# Patient Record
Sex: Female | Born: 1935 | Race: Black or African American | Hispanic: No | Smoking: Former smoker
Health system: Southern US, Community
[De-identification: ages and names within clinical notes are randomized; demographics above are authoritative.]

## PROBLEM LIST (undated history)

## (undated) DIAGNOSIS — I4891 Unspecified atrial fibrillation: Secondary | ICD-10-CM

## (undated) DIAGNOSIS — H9190 Unspecified hearing loss, unspecified ear: Secondary | ICD-10-CM

## (undated) DIAGNOSIS — I10 Essential (primary) hypertension: Secondary | ICD-10-CM

## (undated) DIAGNOSIS — I509 Heart failure, unspecified: Secondary | ICD-10-CM

## (undated) DIAGNOSIS — I499 Cardiac arrhythmia, unspecified: Secondary | ICD-10-CM

## (undated) HISTORY — DX: Unspecified hearing loss, unspecified ear: H91.90

## (undated) HISTORY — DX: Heart failure, unspecified: I50.9

## (undated) HISTORY — DX: Cardiac arrhythmia, unspecified: I49.9

---

## 1998-03-19 ENCOUNTER — Ambulatory Visit (HOSPITAL_COMMUNITY): Admission: RE | Admit: 1998-03-19 | Discharge: 1998-03-19 | Payer: Self-pay | Admitting: Neurosurgery

## 1998-04-16 ENCOUNTER — Observation Stay (HOSPITAL_COMMUNITY): Admission: RE | Admit: 1998-04-16 | Discharge: 1998-04-17 | Payer: Self-pay | Admitting: Neurosurgery

## 2008-10-30 ENCOUNTER — Ambulatory Visit: Payer: Self-pay | Admitting: General Practice

## 2008-11-14 ENCOUNTER — Inpatient Hospital Stay: Payer: Self-pay | Admitting: General Practice

## 2009-05-24 ENCOUNTER — Ambulatory Visit: Payer: Self-pay | Admitting: General Practice

## 2009-05-28 ENCOUNTER — Ambulatory Visit: Payer: Self-pay | Admitting: Cardiovascular Disease

## 2009-06-13 ENCOUNTER — Inpatient Hospital Stay: Payer: Self-pay | Admitting: General Practice

## 2011-10-24 ENCOUNTER — Emergency Department: Payer: Self-pay | Admitting: Emergency Medicine

## 2011-11-02 ENCOUNTER — Emergency Department: Payer: Self-pay | Admitting: Internal Medicine

## 2016-02-09 ENCOUNTER — Ambulatory Visit (INDEPENDENT_AMBULATORY_CARE_PROVIDER_SITE_OTHER): Payer: Medicare HMO

## 2016-02-09 ENCOUNTER — Ambulatory Visit
Admission: EM | Admit: 2016-02-09 | Discharge: 2016-02-09 | Disposition: A | Discharge status: Home / Self Care | Payer: Medicare HMO | Attending: Family Medicine | Admitting: Family Medicine

## 2016-02-09 ENCOUNTER — Encounter: Payer: Self-pay | Admitting: Emergency Medicine

## 2016-02-09 DIAGNOSIS — J181 Lobar pneumonia, unspecified organism: Secondary | ICD-10-CM

## 2016-02-09 DIAGNOSIS — J189 Pneumonia, unspecified organism: Secondary | ICD-10-CM

## 2016-02-09 HISTORY — DX: Unspecified atrial fibrillation: I48.91

## 2016-02-09 HISTORY — DX: Essential (primary) hypertension: I10

## 2016-02-09 MED ORDER — LEVOFLOXACIN 500 MG PO TABS: 500.0000 mg | ORAL_TABLET | Freq: Every day | ORAL | 0 refills | Status: DC

## 2016-02-09 NOTE — Discharge Instructions (Signed)
Take antibiotic as prescribed.  Please see her primary this week.  Take care  Dr. Lacinda Axon

## 2016-02-09 NOTE — ED Provider Notes (Signed)
[facility]    CSN: [id number] Arrival date & time: 02/09/16  [id number]  History   Chief Complaint Chief Complaint  Patient presents with  . Cough   HPI  83 year old female with a history of atrial fibrillation, GERD, hypertension presents with complaints of cough. Care everywhere states a history of heart failure with preserved ejection fraction as well.  Patient reports that she's had cough for the past week. Cough is persistent and nagging. Moderate in severity. Productive of thick yellow sputum. She's been using over-the-counter Robitussin, cough drops, and Delsym without symptom improvement. No reports of increasing shortness of breath. She does appear to have some baseline shortness of breath per the electronic medical record secondary to nature fibrillation. No known exacerbating factors. No associated fever or chills. She has no other complaints or concerns at this time.  Past Medical History:  Diagnosis Date  . Atrial fibrillation ([place])   . Hypertension    History reviewed. No pertinent surgical history.  OB History    No data available     Home Medications    Prior to Admission medications   Medication Sig Start Date End Date Taking? Authorizing Provider  levofloxacin (LEVAQUIN) 500 MG tablet Take 1 tablet (500 mg total) by mouth daily. 02/09/16   Coral Spikes, DO   Family History Family hx of breast cancer.  Social History Social History  Substance Use Topics  . Smoking status: Never Smoker  . Smokeless tobacco: Never Used  . Alcohol use No   Allergies   Patient has no known allergies.  Review of Systems Review of Systems  Constitutional: Negative for fever.  Respiratory: Positive for cough.        Denies shortness of breath.  All other systems reviewed and are negative.  Physical Exam Triage Vital Signs ED Triage Vitals  Enc Vitals Group     BP 02/09/16 1033 (!) 161/91     Pulse Rate 02/09/16 1033 62     Resp 02/09/16 1033 17     Temp  02/09/16 1033 98 F (36.7 C)     Temp Source 02/09/16 1033 Oral     SpO2 02/09/16 1033 95 %     Weight 02/09/16 1031 170 lb (77.1 kg)     Height 02/09/16 1031 5\' 1"  (1.549 m)     Head Circumference --      Peak Flow --      Pain Score 02/09/16 1033 0     Pain Loc --      Pain Edu? --      Excl. in [place]? --    No data found.   Updated Vital Signs BP (!) 161/91 (BP Location: Right Arm)   Pulse 62   Temp 98 F (36.7 C) (Oral)   Resp 17   Ht 5\' 1"  (1.549 m)   Wt 170 lb (77.1 kg)   SpO2 95%   BMI 32.12 kg/m   Physical Exam  Constitutional: She is oriented to person, place, and time.  Elderly female in no acute distress.  HENT:  Head: Normocephalic and atraumatic.  Mouth/Throat: Oropharynx is clear and moist.  Left TM obscured by cerumen. Right TM normal.  Eyes: Conjunctivae are normal.  Neck: Normal range of motion.  Cardiovascular:  Regular rate and rhythm. Patient is not in atrial fibrillation at this time. 1+ lower extremity edema bilaterally.  Pulmonary/Chest: Effort normal.  Bibasilar crackles noted.  Abdominal: Soft. She exhibits no distension. There is no tenderness.  Neurological: She  is alert and oriented to person, place, and time.  Skin: No rash noted.  Psychiatric: She has a normal mood and affect.  Vitals reviewed.  UC Treatments / Results  Labs (all labs ordered are listed, but only abnormal results are displayed) Labs Reviewed - No data to display  EKG  EKG Interpretation None      Radiology Dg Chest 2 View  Result Date: 02/09/2016 CLINICAL DATA:  Cough for 1 week. EXAM: CHEST  2 VIEW COMPARISON:  None. FINDINGS: Mild opacity in the right mid lung on the frontal view. Mild atelectasis in the bases. The cardiomediastinal silhouette is normal. No pneumothorax. No nodules or masses. IMPRESSION: Probable mild pneumonia in the right mid lung. Recommend follow-up to resolution. Electronically Signed   By: Dorise Bullion III M.D   On: 02/09/2016 11:15    Procedures Procedures (including critical care time)  Medications Ordered in UC Medications - No data to display   Initial Impression / Assessment and Plan / UC Course  I have reviewed the triage vital signs and the nursing notes.  Pertinent labs & imaging results that were available during my care of the patient were reviewed by me and considered in my medical decision making (see chart for details).   83 year old female with atrial fibrillation, hypertension, heart failure with preserved ejection fraction presents with cough. Given age and comorbidities, chest x-ray was obtained.  1130 - chest x-ray revealed probable right middle lobe pneumonia. Treating with Levaquin. Advised PCP follow-up this week. Final Clinical Impressions(s) / UC Diagnoses   Final diagnoses:  Community acquired pneumonia of right middle lobe of lung ([place])    New Prescriptions Discharge Medication List as of 02/09/2016 11:32 AM    START taking these medications   Details  levofloxacin (LEVAQUIN) 500 MG tablet Take 1 tablet (500 mg total) by mouth daily., Starting Sat 02/09/2016, Normal         Coral Spikes, DO 02/09/16 1134

## 2016-02-09 NOTE — ED Triage Notes (Signed)
Patient c/o cough and chest congestion for 3-4 days.  Patient denies fevers.

## 2016-02-21 ENCOUNTER — Other Ambulatory Visit: Payer: Self-pay | Admitting: Family Medicine

## 2016-02-21 ENCOUNTER — Ambulatory Visit
Admission: RE | Admit: 2016-02-21 | Discharge: 2016-02-21 | Disposition: A | Discharge status: Home / Self Care | Payer: Medicare HMO | Source: Ambulatory Visit | Attending: Family Medicine | Admitting: Family Medicine

## 2016-02-21 DIAGNOSIS — R059 Cough, unspecified: Secondary | ICD-10-CM

## 2016-02-21 DIAGNOSIS — R05 Cough: Secondary | ICD-10-CM

## 2016-02-21 DIAGNOSIS — J189 Pneumonia, unspecified organism: Secondary | ICD-10-CM | POA: Insufficient documentation

## 2016-12-31 ENCOUNTER — Inpatient Hospital Stay
Admission: EM | Admit: 2016-12-31 | Discharge: 2017-01-08 | DRG: 871 | Disposition: A | Discharge status: Skilled Nursing Facility | Payer: Medicare HMO | Attending: Internal Medicine | Admitting: Internal Medicine

## 2016-12-31 ENCOUNTER — Other Ambulatory Visit: Payer: Self-pay

## 2016-12-31 ENCOUNTER — Emergency Department: Payer: Medicare HMO

## 2016-12-31 ENCOUNTER — Encounter: Payer: Self-pay | Admitting: Emergency Medicine

## 2016-12-31 DIAGNOSIS — F4320 Adjustment disorder, unspecified: Secondary | ICD-10-CM | POA: Diagnosis present

## 2016-12-31 DIAGNOSIS — I481 Persistent atrial fibrillation: Secondary | ICD-10-CM | POA: Diagnosis present

## 2016-12-31 DIAGNOSIS — E782 Mixed hyperlipidemia: Secondary | ICD-10-CM | POA: Diagnosis present

## 2016-12-31 DIAGNOSIS — Z515 Encounter for palliative care: Secondary | ICD-10-CM

## 2016-12-31 DIAGNOSIS — Z7984 Long term (current) use of oral hypoglycemic drugs: Secondary | ICD-10-CM

## 2016-12-31 DIAGNOSIS — Z79899 Other long term (current) drug therapy: Secondary | ICD-10-CM

## 2016-12-31 DIAGNOSIS — I509 Heart failure, unspecified: Secondary | ICD-10-CM | POA: Diagnosis not present

## 2016-12-31 DIAGNOSIS — I130 Hypertensive heart and chronic kidney disease with heart failure and stage 1 through stage 4 chronic kidney disease, or unspecified chronic kidney disease: Secondary | ICD-10-CM | POA: Diagnosis present

## 2016-12-31 DIAGNOSIS — J9621 Acute and chronic respiratory failure with hypoxia: Secondary | ICD-10-CM | POA: Diagnosis present

## 2016-12-31 DIAGNOSIS — I482 Chronic atrial fibrillation: Secondary | ICD-10-CM | POA: Diagnosis present

## 2016-12-31 DIAGNOSIS — I5033 Acute on chronic diastolic (congestive) heart failure: Secondary | ICD-10-CM | POA: Diagnosis present

## 2016-12-31 DIAGNOSIS — J9601 Acute respiratory failure with hypoxia: Secondary | ICD-10-CM | POA: Diagnosis not present

## 2016-12-31 DIAGNOSIS — R0902 Hypoxemia: Secondary | ICD-10-CM

## 2016-12-31 DIAGNOSIS — K219 Gastro-esophageal reflux disease without esophagitis: Secondary | ICD-10-CM | POA: Diagnosis present

## 2016-12-31 DIAGNOSIS — I4891 Unspecified atrial fibrillation: Secondary | ICD-10-CM

## 2016-12-31 DIAGNOSIS — F329 Major depressive disorder, single episode, unspecified: Secondary | ICD-10-CM | POA: Diagnosis present

## 2016-12-31 DIAGNOSIS — Z66 Do not resuscitate: Secondary | ICD-10-CM | POA: Diagnosis present

## 2016-12-31 DIAGNOSIS — I248 Other forms of acute ischemic heart disease: Secondary | ICD-10-CM | POA: Diagnosis present

## 2016-12-31 DIAGNOSIS — A419 Sepsis, unspecified organism: Principal | ICD-10-CM | POA: Diagnosis present

## 2016-12-31 DIAGNOSIS — N179 Acute kidney failure, unspecified: Secondary | ICD-10-CM | POA: Diagnosis present

## 2016-12-31 DIAGNOSIS — B963 Hemophilus influenzae [H. influenzae] as the cause of diseases classified elsewhere: Secondary | ICD-10-CM | POA: Diagnosis present

## 2016-12-31 DIAGNOSIS — Z9119 Patient's noncompliance with other medical treatment and regimen: Secondary | ICD-10-CM

## 2016-12-31 DIAGNOSIS — J9690 Respiratory failure, unspecified, unspecified whether with hypoxia or hypercapnia: Secondary | ICD-10-CM

## 2016-12-31 DIAGNOSIS — N183 Chronic kidney disease, stage 3 (moderate): Secondary | ICD-10-CM | POA: Diagnosis present

## 2016-12-31 DIAGNOSIS — J9600 Acute respiratory failure, unspecified whether with hypoxia or hypercapnia: Secondary | ICD-10-CM

## 2016-12-31 LAB — CBC
HEMATOCRIT: 35.5 % (ref 35.0–47.0)
HEMOGLOBIN: 11.2 g/dL — AB (ref 12.0–16.0)
MCH: 26.3 pg (ref 26.0–34.0)
MCHC: 31.6 g/dL — ABNORMAL LOW (ref 32.0–36.0)
MCV: 83.2 fL (ref 80.0–100.0)
Platelets: 240 10*3/uL (ref 150–440)
RBC: 4.26 MIL/uL (ref 3.80–5.20)
RDW: 17 % — AB (ref 11.5–14.5)
WBC: 14.2 10*3/uL — AB (ref 3.6–11.0)

## 2016-12-31 LAB — COMPREHENSIVE METABOLIC PANEL
ALBUMIN: 2.7 g/dL — AB (ref 3.5–5.0)
ALK PHOS: 84 U/L (ref 38–126)
ALT: 21 U/L (ref 14–54)
AST: 49 U/L — AB (ref 15–41)
Anion gap: 10 (ref 5–15)
BILIRUBIN TOTAL: 1.8 mg/dL — AB (ref 0.3–1.2)
BUN: 52 mg/dL — AB (ref 6–20)
CO2: 24 mmol/L (ref 22–32)
CREATININE: 1.66 mg/dL — AB (ref 0.44–1.00)
Calcium: 8.6 mg/dL — ABNORMAL LOW (ref 8.9–10.3)
Chloride: 103 mmol/L (ref 101–111)
GFR calc Af Amer: 31 mL/min — ABNORMAL LOW (ref >60)
GFR, EST NON AFRICAN AMERICAN: 27 mL/min — AB (ref >60)
GLUCOSE: 89 mg/dL (ref 65–99)
Potassium: 3.7 mmol/L (ref 3.5–5.1)
Sodium: 137 mmol/L (ref 135–145)
TOTAL PROTEIN: 7.2 g/dL (ref 6.5–8.1)

## 2016-12-31 LAB — BRAIN NATRIURETIC PEPTIDE: B NATRIURETIC PEPTIDE 5: 1058 pg/mL — AB (ref 0.0–100.0)

## 2016-12-31 LAB — TROPONIN I
TROPONIN I: 0.08 ng/mL — AB (ref <0.03)
TROPONIN I: 0.07 ng/mL — AB (ref <0.03)
TROPONIN I: 0.05 ng/mL — AB (ref <0.03)

## 2016-12-31 MED ORDER — FUROSEMIDE 10 MG/ML IJ SOLN
60.0000 mg | Freq: Once | INTRAMUSCULAR | Status: AC
  Administered 2016-12-31: 60 mg via INTRAVENOUS
  Filled 2016-12-31: qty 8

## 2016-12-31 MED ORDER — ACETAMINOPHEN 650 MG RE SUPP: 650.0000 mg | Freq: Four times a day (QID) | RECTAL | Status: DC | PRN

## 2016-12-31 MED ORDER — RIVAROXABAN 20 MG PO TABS
20.0000 mg | ORAL_TABLET | Freq: Every day | ORAL | Status: DC
  Administered 2016-12-31: 20 mg via ORAL
  Filled 2016-12-31: qty 1

## 2016-12-31 MED ORDER — SODIUM CHLORIDE 0.9% FLUSH
3.0000 mL | Freq: Two times a day (BID) | INTRAVENOUS | Status: DC
  Administered 2016-12-31 – 2017-01-07 (×10): 3 mL via INTRAVENOUS

## 2016-12-31 MED ORDER — ONDANSETRON HCL 4 MG/2ML IJ SOLN: 4.0000 mg | Freq: Four times a day (QID) | INTRAMUSCULAR | Status: DC | PRN

## 2016-12-31 MED ORDER — FUROSEMIDE 10 MG/ML IJ SOLN
20.0000 mg | Freq: Two times a day (BID) | INTRAMUSCULAR | Status: DC
  Administered 2016-12-31 – 2017-01-01 (×3): 20 mg via INTRAVENOUS
  Filled 2016-12-31 (×4): qty 2

## 2016-12-31 MED ORDER — SENNOSIDES-DOCUSATE SODIUM 8.6-50 MG PO TABS: 1.0000 | ORAL_TABLET | Freq: Every evening | ORAL | Status: DC | PRN

## 2016-12-31 MED ORDER — SODIUM CHLORIDE 0.9% FLUSH
3.0000 mL | INTRAVENOUS | Status: DC | PRN
  Administered 2016-12-31: 3 mL via INTRAVENOUS
  Filled 2016-12-31: qty 3

## 2016-12-31 MED ORDER — ACETAMINOPHEN 325 MG PO TABS: 650.0000 mg | ORAL_TABLET | Freq: Four times a day (QID) | ORAL | Status: DC | PRN

## 2016-12-31 MED ORDER — ONDANSETRON HCL 4 MG PO TABS: 4.0000 mg | ORAL_TABLET | Freq: Four times a day (QID) | ORAL | Status: DC | PRN

## 2016-12-31 MED ORDER — RIVAROXABAN 20 MG PO TABS: 20.0000 mg | ORAL_TABLET | Freq: Every day | ORAL | Status: DC

## 2016-12-31 MED ORDER — SODIUM CHLORIDE 0.9 % IV SOLN: 250.0000 mL | INTRAVENOUS | Status: DC | PRN

## 2016-12-31 MED ORDER — KETOROLAC TROMETHAMINE 15 MG/ML IJ SOLN: 15.0000 mg | Freq: Four times a day (QID) | INTRAMUSCULAR | Status: DC | PRN

## 2016-12-31 MED ORDER — METOPROLOL SUCCINATE ER 100 MG PO TB24
100.0000 mg | ORAL_TABLET | Freq: Every day | ORAL | Status: DC
Start: 2016-12-31 — End: 2017-01-08
  Administered 2016-12-31 – 2017-01-08 (×7): 100 mg via ORAL
  Filled 2016-12-31 (×7): qty 1

## 2016-12-31 MED ORDER — HYDROCODONE-ACETAMINOPHEN 5-325 MG PO TABS: 1.0000 | ORAL_TABLET | ORAL | Status: DC | PRN

## 2016-12-31 NOTE — Progress Notes (Signed)
Family Meeting Note  Advance Directive:no  Today a meeting took place with the Patient. The following clinical team members were present during this meeting:MD  The following were discussed:Patient's diagnosis: chf exacerbation Atrial fibrillation, Patient's progosis: > 12 months and Goals for treatment: DNR  Additional follow-up to be provided: Chaplain consult to start advanced directives Time spent during discussion: 16 minutes  Mercedes Belko, MD

## 2016-12-31 NOTE — Progress Notes (Signed)
CCMD notified this RN that pt HR jumps to 170-180, MD notifed for possible PRN orders, pt also given 100mg  of scheduled metoprolol  and 20 mg scheduled  IV lasix at this time. Pt 92% on 3 L [place] acute from ED at this time, no furhter complaints.

## 2016-12-31 NOTE — ED Provider Notes (Signed)
[facility] Emergency Department Provider Note  Time seen: 9:17 AM  I have reviewed the triage vital signs and the nursing notes.   HISTORY  Chief Complaint Cough    HPI Mercedes Rodriguez is a 83 y.o. female with a past medical history of atrial fibrillation, hypertension, presents to the emergency department for shortness of breath cough and congestion.  According to the patient for the past 3 days she has been coughing with yellow/green sputum production.  Patient states increasing short of breath worse today.  Upon arrival the patient has not O2 saturation of 82% on room air, no home O2 requirement at baseline.  Patient denies any chest pain abdominal pain, vomiting, largely negative review of systems.   Past Medical History:  Diagnosis Date  . Atrial fibrillation ([place])   . Hypertension     There are no active problems to display for this patient.   No past surgical history on file.  Prior to Admission medications   Medication Sig Start Date End Date Taking? Authorizing Provider  levofloxacin (LEVAQUIN) 500 MG tablet Take 1 tablet (500 mg total) by mouth daily. 02/09/16   Coral Spikes, DO    No Known Allergies  No family history on file.  Social History Social History   Tobacco Use  . Smoking status: Never Smoker  . Smokeless tobacco: Never Used  Substance Use Topics  . Alcohol use: No  . Drug use: Not on file    Review of Systems Constitutional: Negative for fever ENT: Negative for congestion Cardiovascular: Negative for chest pain. Respiratory: Positive for shortness of breath, cough, sputum production. Gastrointestinal: Negative for abdominal pain, vomiting  Genitourinary: Negative for dysuria. Musculoskeletal: Negative for leg pain or swelling Skin: Negative for rash. Neurological: Negative for headache All other ROS negative  ____________________________________________   PHYSICAL EXAM:  VITAL SIGNS: ED Triage Vitals  Enc  Vitals Group     BP 12/31/16 0915 (!) 168/88     Pulse Rate 12/31/16 0915 94     Resp 12/31/16 0915 (!) 28     Temp 12/31/16 0915 98.7 F (37.1 C)     Temp Source 12/31/16 0915 Oral     SpO2 12/31/16 0915 (!) 82 %     Weight 12/31/16 0916 152 lb (68.9 kg)     Height --      Head Circumference --      Peak Flow --      Pain Score --      Pain Loc --      Pain Edu? --      Excl. in [place]? --     Constitutional: Alert and oriented.  Sitting upright in bed, mild tachypnea.  No distress though. Eyes: Normal exam ENT   Head: Normocephalic and atraumatic.   Mouth/Throat: Mucous membranes are moist. Cardiovascular: Normal rate, regular rhythm.  Respiratory: Mild tachypnea, moderate rhonchi in left lung fields, mostly clear right lung fields. Gastrointestinal: Soft and nontender. No distention.   Musculoskeletal: Nontender with normal range of motion in all extremities. No lower extremity tenderness or edema. Neurologic:  Normal speech and language. No gross focal neurologic deficits Skin:  Skin is warm, dry and intact.  Psychiatric: Mood and affect are normal.   ____________________________________________    EKG  EKG reviewed and interpreted by myself shows what appears to be most consistent with atrial fibrillation at 110 bpm with a narrow QRS, left axis deviation, largely normal intervals with nonspecific ST changes but no concerning  ST elevation noted.  ____________________________________________    RADIOLOGY  IMPRESSION: Cardiomegaly with diffuse bilateral interstitial prominence of small bilateral pleural effusions consistent with CHF .  ____________________________________________   INITIAL IMPRESSION / ASSESSMENT AND PLAN / ED COURSE  Pertinent labs & imaging results that were available during my care of the patient were reviewed by me and considered in my medical decision making (see chart for details).  Patient presents to the emergency department for  cough and shortness of breath.  Differential would include pneumonia, upper respiratory infection, pneumothorax, ACS.  We will check labs, chest x-ray and closely monitor in the emergency department.  The patient is currently satting 82% with a good waveform on room air, will place on oxygen via nasal cannula.  We will check labs including blood cultures.  Anticipate likely admission to the hospital given hypoxia.  I reviewed the patient's records including recent cardiology record obtaining this past medical history: Past Medical History:  Diagnosis Date  . Atrial fibrillation, persistent (CMS-HCC)  . Bilateral lower extremity edema  . Dyspnea on exertion  . Fatigue  . GERD (gastroesophageal reflux disease)  . Heart failure with preserved ejection fraction (CMS-HCC)  . Hypertension  . Hypoxia   No past surgical history on file.   Social History: Reviewed and updated in Epic.  History  Alcohol Use No   Allergies: Allergies  Allergen Reactions  . Aspirin Other (See Comments)  Non enteric coated aspirin causes stomach pain   Medications  Current Outpatient Prescriptions on File Prior to Visit  Medication Sig  . aspirin-acetaminophen-caffeine (EXCEDRIN MIGRAINE) 250-250-65 mg per tablet Take 2 tablets by mouth every six (6) hours as needed for pain.  Marland Kitchen dofetilide (TIKOSYN) 250 MCG capsule Take 1 capsule (250 mcg total) by mouth every twelve (12) hours.  . furosemide (LASIX) 20 MG tablet TAKE 1 TABLET (20 MG TOTAL) BY MOUTH DAILY AS NEEDED (FOR SHORTNESS OF BREATH).  . magnesium oxide (MAG-OX) 400 mg tablet TAKE 1 TABLET (400 MG TOTAL) BY MOUTH DAILY.  . metoprolol succinate (TOPROL-XL) 100 MG 24 hr tablet TAKE 1 TABLET BY MOUTH EVERY DAY  . omeprazole (PRILOSEC) 20 MG capsule Take 20 mg by mouth daily as needed.  . rivaroxaban (XARELTO) 20 mg tablet Take 1 tablet (20 mg total) by mouth daily.  . magnesium oxide (MAG-OX) 400 mg tablet Take 1 tablet (400 mg total) by mouth daily.  Marland Kitchen  spironolactone (ALDACTONE) 25 MG tablet Take 1 tablet (25 mg total) by mouth daily. (Patient not taking: Reported on 10/03/2016)      Chest x-ray appears most consistent with CHF exacerbation/pulmonary edema.   Patient's labs do show an elevated troponin 0.08, will add on a BNP.  At this time patient's workup is most consistent pulmonary edema/CHF exacerbation.  We will place on IV Lasix and admit to the hospital for further treatment.  Patient agreeable to plan.  ____________________________________________   FINAL CLINICAL IMPRESSION(S) / ED DIAGNOSES  CHF exacerbation     Harvest Dark, MD 12/31/16 1017

## 2016-12-31 NOTE — Progress Notes (Signed)
Chaplain received an OR for pt. who had requested for Advanced Directive. CH met pt and pt.'s aid in the ED but before chaplain educated pt., the pt. was moved to [id number]. CH followed the pt., waited for RN to complete pt.'s adjustment to the floor and then visited with pt.CH educated pt. on how to complete AD, pt. states she will review material and will call CH when ready to complete AD. CH is available to follow up pt. as needed.    12/31/16 1200  Clinical Encounter Type  Visited With Patient;Patient and family together  Visit Type Initial;Follow-up;Other (Comment)  Referral From Nurse  Consult/Referral To Chaplain  Spiritual Encounters  Spiritual Needs Literature;Brochure   

## 2016-12-31 NOTE — ED Notes (Signed)
Attempted to call report, receiving RN unable to take report at this time. Receiving RN to call back.

## 2016-12-31 NOTE — ED Triage Notes (Signed)
Pt arrived via EMS from home for reports of cough for two days. EMS reports initial foom air SpO2 84%, O2 4L via  brought SpO2 up to 95%. EMS reports VSS, a-fib on monitor, history of same, 99.7 oral temp.

## 2016-12-31 NOTE — H&P (Signed)
[place] [facility] [place] NAME: Mercedes Rodriguez    MR#:  [id number]  DATE OF BIRTH:  1929-06-12  DATE OF ADMISSION:  12/31/2016  PRIMARY CARE PHYSICIAN: Lynnell Jude, MD   REQUESTING/REFERRING PHYSICIAN: dr Kerman Passey  CHIEF COMPLAINT:   SOB HISTORY OF PRESENT ILLNESS:  Mercedes Rodriguez  is a 83 y.o. female with a known history of persistent atrial fibrillation, diastolic heart failure with pager ejection fraction and essential hypertension who presents with above complaint. Over the past 2-3 days patient has been complaining of increasing shortness of breath, dyspnea on exertion and PND. She is also reporting a cough however no fevers. She denies lower extremity edema or chest pain. Her family friend called EMS today due to patient complaining of shortness of breath. She was found to have oxygen saturations of 82% on room air. She does not wear oxygen at home. She gets her cardiology follow-up at [facility].   in the emergency room she has received IV Lasix. Chest x-ray is consistent with CHF exacerbation.  PAST MEDICAL HISTORY:   Past Medical History:  Diagnosis Date  . Atrial fibrillation ([place])   . Hypertension     PAST SURGICAL HISTORY:   none SOCIAL HISTORY:   Social History   Tobacco Use  . Smoking status: Never Smoker  . Smokeless tobacco: Never Used  Substance Use Topics  . Alcohol use: No    FAMILY HISTORY:  Breast cancer  DRUG ALLERGIES:  No Known Allergies  REVIEW OF SYSTEMS:   Review of Systems  Constitutional: Negative.  Negative for chills, fever and malaise/fatigue.  HENT: Negative.  Negative for ear discharge, ear pain, hearing loss, nosebleeds and sore throat.   Eyes: Negative.  Negative for blurred vision and pain.  Respiratory: Positive for cough and shortness of breath. Negative for hemoptysis and wheezing.   Cardiovascular: Positive for PND. Negative for chest pain, palpitations and leg swelling.  Gastrointestinal:  Negative.  Negative for abdominal pain, blood in stool, diarrhea, nausea and vomiting.  Genitourinary: Negative.  Negative for dysuria.  Musculoskeletal: Negative.  Negative for back pain.  Skin: Negative.   Neurological: Negative for dizziness, tremors, speech change, focal weakness, seizures and headaches.  Endo/Heme/Allergies: Negative.  Does not bruise/bleed easily.  Psychiatric/Behavioral: Negative.  Negative for depression, hallucinations and suicidal ideas.    MEDICATIONS AT HOME:   Prior to Admission medications   Medication Sig Start Date End Date Taking? Authorizing Provider  metoprolol succinate (TOPROL-XL) 100 MG 24 hr tablet Take 100 mg by mouth daily. 11/19/16  Yes [provider]  rivaroxaban (XARELTO) 20 MG TABS tablet Take 20 mg by mouth daily. 10/27/16  Yes [provider]  levofloxacin (LEVAQUIN) 500 MG tablet Take 1 tablet (500 mg total) by mouth daily. Patient not taking: Reported on 12/31/2016 02/09/16   Coral Spikes, DO      VITAL SIGNS:  Blood pressure 122/73, pulse 84, temperature 98.7 F (37.1 C), temperature source Oral, resp. rate (!) 24, weight 68.9 kg (152 lb), SpO2 91 %.  PHYSICAL EXAMINATION:   Physical Exam  Constitutional: She is oriented to person, place, and time and well-developed, well-nourished, and in no distress. No distress.  HENT:  Head: Normocephalic.  Eyes: No scleral icterus.  Neck: Normal range of motion. Neck supple. JVD present. No tracheal deviation present.  Cardiovascular: Normal rate, regular rhythm and normal heart sounds. Exam reveals no gallop and no friction rub.  No murmur heard. Pulmonary/Chest: Effort normal. No  respiratory distress. She has no wheezes. She has no rales. She exhibits no tenderness.  Crackles at bases  Abdominal: Soft. Bowel sounds are normal. She exhibits no distension and no mass. There is no tenderness. There is no rebound and no guarding.  Musculoskeletal: Normal range of motion. She  exhibits no edema.  Neurological: She is alert and oriented to person, place, and time.  Skin: Skin is warm. No rash noted. No erythema.  Psychiatric: Affect and judgment normal.      LABORATORY PANEL:   CBC Recent Labs  Lab 12/31/16 0915  WBC 14.2*  HGB 11.2*  HCT 35.5  PLT 240   ------------------------------------------------------------------------------------------------------------------  Chemistries  Recent Labs  Lab 12/31/16 0915  NA 137  K 3.7  CL 103  CO2 24  GLUCOSE 89  BUN 52*  CREATININE 1.66*  CALCIUM 8.6*  AST 49*  ALT 21  ALKPHOS 84  BILITOT 1.8*   ------------------------------------------------------------------------------------------------------------------  Cardiac Enzymes Recent Labs  Lab 12/31/16 0915  TROPONINI 0.08*   ------------------------------------------------------------------------------------------------------------------  RADIOLOGY:  Dg Chest Portable 1 View  Result Date: 12/31/2016 CLINICAL DATA:  Cough for 2 days. EXAM: PORTABLE CHEST 1 VIEW COMPARISON:  02/21/2016 . FINDINGS: Cardiomegaly with diffuse bilateral interstitial prominence and small bilateral pleural effusions consistent with CHF. No pneumothorax. Cervical spine fusion. Degenerative changes scoliosis thoracic spine . IMPRESSION: Cardiomegaly with diffuse bilateral interstitial prominence of small bilateral pleural effusions consistent with CHF . Electronically Signed   By: Marcello Moores  Register   On: 12/31/2016 09:29    EKG:   Ectopic atrial tachycardia PVCs HR 110  IMPRESSION AND PLAN:   83 year old female with a history of persistent atrial fibrillation, chronic diastolic heart failure with preserved ejection fraction who presents with shortness of breath.    1. Acute hypoxic respiratory failure in the setting of acute CHF exacerbation.   wean oxygen as tolerated  2. Acute on chronic diastolic heart failure with preserved ejection fraction: Lasix 20 IV  every 12 with plans to transition to oral once she is euvolemic. Continue to monitor intake and output with daily weight Continue metoprolol Patient is followed at [facility] cardiology. She will need CHF clinic referral upon discharge.  3. Persistent atrial fibrillation: Continue Xarelto and metoprolol.  4. Elevated troponin: This may be due to demand ischemia. Continue to trend troponins Continue telemetry If troponins are increasing then will have cardiology consultation.    All the records are reviewed and case discussed with ED provider. Management plans discussed with the patient and she is in agreement  CODE STATUS: DNR  TOTAL TIME TAKING CARE OF THIS PATIENT: 45 minutes.    Sulay Brymer M.D on 12/31/2016 at 10:36 AM  Between 7am to 6pm - Pager - [phone]  After 6pm go to [email or web address] - password [email or web address] [facility]  Office  [phone]  CC: Primary care physician; Lynnell Jude, MD

## 2016-12-31 NOTE — ED Notes (Signed)
Date and time results received: 12/31/16 1010(use smartphrase ".now" to insert current time)  Test: troponin Critical Value: 0.08  Name of Provider Notified: Dr. Kerman Passey  Orders Received? Or Actions Taken?: Orders Received - See Orders for details

## 2017-01-01 ENCOUNTER — Inpatient Hospital Stay: Payer: Medicare HMO

## 2017-01-01 DIAGNOSIS — J9601 Acute respiratory failure with hypoxia: Secondary | ICD-10-CM

## 2017-01-01 LAB — BASIC METABOLIC PANEL
Anion gap: 7 (ref 5–15)
BUN: 56 mg/dL — ABNORMAL HIGH (ref 6–20)
CALCIUM: 8.3 mg/dL — AB (ref 8.9–10.3)
CO2: 27 mmol/L (ref 22–32)
CREATININE: 1.7 mg/dL — AB (ref 0.44–1.00)
Chloride: 104 mmol/L (ref 101–111)
GFR, EST AFRICAN AMERICAN: 30 mL/min — AB (ref >60)
GFR, EST NON AFRICAN AMERICAN: 26 mL/min — AB (ref >60)
Glucose, Bld: 122 mg/dL — ABNORMAL HIGH (ref 65–99)
Potassium: 3.8 mmol/L (ref 3.5–5.1)
SODIUM: 138 mmol/L (ref 135–145)

## 2017-01-01 LAB — BLOOD CULTURE ID PANEL (REFLEXED)
Acinetobacter baumannii: NOT DETECTED
CANDIDA KRUSEI: NOT DETECTED
Candida albicans: NOT DETECTED
Candida glabrata: NOT DETECTED
Candida parapsilosis: NOT DETECTED
Candida tropicalis: NOT DETECTED
ENTEROBACTER CLOACAE COMPLEX: NOT DETECTED
ENTEROCOCCUS SPECIES: NOT DETECTED
ESCHERICHIA COLI: NOT DETECTED
Enterobacteriaceae species: NOT DETECTED
Haemophilus influenzae: DETECTED — AB
Klebsiella oxytoca: NOT DETECTED
Klebsiella pneumoniae: NOT DETECTED
LISTERIA MONOCYTOGENES: NOT DETECTED
Neisseria meningitidis: NOT DETECTED
PSEUDOMONAS AERUGINOSA: NOT DETECTED
Proteus species: NOT DETECTED
SERRATIA MARCESCENS: NOT DETECTED
STAPHYLOCOCCUS AUREUS BCID: NOT DETECTED
STREPTOCOCCUS PNEUMONIAE: NOT DETECTED
STREPTOCOCCUS PYOGENES: NOT DETECTED
Staphylococcus species: NOT DETECTED
Streptococcus agalactiae: NOT DETECTED
Streptococcus species: NOT DETECTED

## 2017-01-01 LAB — CBC
HCT: 34.3 % — ABNORMAL LOW (ref 35.0–47.0)
Hemoglobin: 10.7 g/dL — ABNORMAL LOW (ref 12.0–16.0)
MCH: 26.1 pg (ref 26.0–34.0)
MCHC: 31.2 g/dL — ABNORMAL LOW (ref 32.0–36.0)
MCV: 83.7 fL (ref 80.0–100.0)
PLATELETS: 257 10*3/uL (ref 150–440)
RBC: 4.1 MIL/uL (ref 3.80–5.20)
RDW: 16.8 % — ABNORMAL HIGH (ref 11.5–14.5)
WBC: 12.5 10*3/uL — AB (ref 3.6–11.0)

## 2017-01-01 LAB — MRSA PCR SCREENING: MRSA BY PCR: NEGATIVE

## 2017-01-01 LAB — GLUCOSE, CAPILLARY: GLUCOSE-CAPILLARY: 142 mg/dL — AB (ref 65–99)

## 2017-01-01 LAB — TROPONIN I: Troponin I: 0.06 ng/mL (ref <0.03)

## 2017-01-01 MED ORDER — RIVAROXABAN 15 MG PO TABS
15.0000 mg | ORAL_TABLET | Freq: Every day | ORAL | Status: DC
  Administered 2017-01-01 – 2017-01-07 (×7): 15 mg via ORAL
  Filled 2017-01-01 (×8): qty 1

## 2017-01-01 MED ORDER — AMIODARONE HCL IN DEXTROSE 360-4.14 MG/200ML-% IV SOLN
60.0000 mg/h | INTRAVENOUS | Status: DC
  Administered 2017-01-02 (×2): 30 mg/h via INTRAVENOUS
  Administered 2017-01-03 (×2): 60 mg/h via INTRAVENOUS
  Administered 2017-01-03: 30 mg/h via INTRAVENOUS
  Administered 2017-01-04 (×2): 60 mg/h via INTRAVENOUS
  Filled 2017-01-01 (×5): qty 200

## 2017-01-01 MED ORDER — AMIODARONE LOAD VIA INFUSION
150.0000 mg | Freq: Once | INTRAVENOUS | Status: AC
  Administered 2017-01-02: 150 mg via INTRAVENOUS
  Filled 2017-01-01 (×2): qty 83.34

## 2017-01-01 MED ORDER — CHLORHEXIDINE GLUCONATE 0.12 % MT SOLN
15.0000 mL | Freq: Two times a day (BID) | OROMUCOSAL | Status: DC
  Administered 2017-01-01 – 2017-01-08 (×12): 15 mL via OROMUCOSAL
  Filled 2017-01-01 (×10): qty 15

## 2017-01-01 MED ORDER — POTASSIUM CHLORIDE CRYS ER 20 MEQ PO TBCR
40.0000 meq | EXTENDED_RELEASE_TABLET | ORAL | Status: AC
  Administered 2017-01-01: 40 meq via ORAL
  Filled 2017-01-01: qty 2

## 2017-01-01 MED ORDER — AZITHROMYCIN 500 MG IV SOLR
500.0000 mg | INTRAVENOUS | Status: DC
  Administered 2017-01-01: 500 mg via INTRAVENOUS
  Filled 2017-01-01 (×2): qty 500

## 2017-01-01 MED ORDER — METOPROLOL TARTRATE 5 MG/5ML IV SOLN
5.0000 mg | INTRAVENOUS | Status: DC | PRN
  Administered 2017-01-01 – 2017-01-03 (×5): 5 mg via INTRAVENOUS
  Filled 2017-01-01 (×5): qty 5

## 2017-01-01 MED ORDER — AMIODARONE HCL IN DEXTROSE 360-4.14 MG/200ML-% IV SOLN
60.0000 mg/h | INTRAVENOUS | Status: AC
  Administered 2017-01-02: 60 mg/h via INTRAVENOUS
  Filled 2017-01-01 (×3): qty 200

## 2017-01-01 MED ORDER — DEXTROSE 5 % IV SOLN
2.0000 g | Freq: Every day | INTRAVENOUS | Status: DC
  Administered 2017-01-01 – 2017-01-05 (×5): 2 g via INTRAVENOUS
  Filled 2017-01-01 (×7): qty 2

## 2017-01-01 MED ORDER — DIGOXIN 0.25 MG/ML IJ SOLN
0.1250 mg | Freq: Once | INTRAMUSCULAR | Status: AC
  Administered 2017-01-01: 0.125 mg via INTRAVENOUS
  Filled 2017-01-01 (×2): qty 0.5

## 2017-01-01 MED ORDER — FUROSEMIDE 10 MG/ML IJ SOLN
20.0000 mg | Freq: Once | INTRAMUSCULAR | Status: AC
  Administered 2017-01-01: 20 mg via INTRAVENOUS
  Filled 2017-01-01: qty 2

## 2017-01-01 MED ORDER — FUROSEMIDE 10 MG/ML IJ SOLN
20.0000 mg | Freq: Once | INTRAMUSCULAR | Status: AC
Start: 2017-01-01 — End: 2017-01-01
  Administered 2017-01-01: 20 mg via INTRAVENOUS
  Filled 2017-01-01: qty 2

## 2017-01-01 MED ORDER — DILTIAZEM HCL 100 MG IV SOLR
5.0000 mg/h | INTRAVENOUS | Status: DC
  Administered 2017-01-01: 10 mg/h via INTRAVENOUS
  Administered 2017-01-01: 15 mg/h via INTRAVENOUS
  Administered 2017-01-01 (×2): 12.5 mg/h via INTRAVENOUS
  Administered 2017-01-01: 5 mg/h via INTRAVENOUS
  Filled 2017-01-01 (×3): qty 100

## 2017-01-01 MED ORDER — ORAL CARE MOUTH RINSE
15.0000 mL | Freq: Two times a day (BID) | OROMUCOSAL | Status: DC
  Administered 2017-01-03 – 2017-01-07 (×8): 15 mL via OROMUCOSAL

## 2017-01-01 NOTE — Progress Notes (Signed)
Patients HR remains unchanged despite cardizem gtt at 15mg /hr. MD aware. Patient oxygen demands/WOB have increased as well as patient now on 5L acute with oxygen sats around 93. MD aware. Orders given for IV lasix and IV digoxin.   Blood cultures positive, MD notified. Orders for IV abx placed.   Will give and continue to monitor.

## 2017-01-01 NOTE — Progress Notes (Signed)
BP recycled, improved.118/94 Card gtt restarted. HR in 120's-130s. MD aware. Patient to transfer to ICU for continuous BiPAP.

## 2017-01-01 NOTE — Progress Notes (Signed)
Patients HR in 130's-150's in Atrial fib. MD notified. Orders for cardizem gtt given and consult placed to cardiology, will given and continue to monitor.

## 2017-01-01 NOTE — Progress Notes (Signed)
CH responded to an OR for an AD. The Pt appeared to be groggy but able to understand AD Education. Several family members were bedside. [place] educated on the document and stressed that the decision is the Pt's. Pt will discuss with her family and seek completion on 01/02/17 in the AM.    01/01/17 2000  Clinical Encounter Type  Visited With Patient;Patient and family together  Visit Type Initial;Spiritual support  Referral From Nurse  Consult/Referral To Chaplain  Spiritual Encounters  Spiritual Needs Literature

## 2017-01-01 NOTE — Consult Note (Signed)
Name: Mercedes Rodriguez MRN: [id number] DOB: 08/28/1929    ADMISSION DATE:  12/31/2016 CONSULTATION DATE: 01/01/2017  REFERRING MD : Dr. Anselm Jungling   CHIEF COMPLAINT: Shortness of Breath   BRIEF PATIENT DESCRIPTION:  83 yo female admitted 12/26 to telemetry unit with acute on chronic hypoxic respiratory failure secondary to CHF exacerbation and questionable pneumonia, atrial fibrillation with rvr requiring cardizem gtt, and H. Influenza bacteremia.  Transferred to [facility] 12/27 due to acute encephalopathy and worsening respiratory failure requiring Bipap   SIGNIFICANT EVENTS  12/26-Pt admitted to telemetry unit  12/27-Pt transferred to stepdown unit   STUDIES:  None   HISTORY OF PRESENT ILLNESS:   This is an 83 yo female with a PMH of HTN, Chronic Diastolic CHF, Chronic Atrial Fibrillation (on xarelto), and GERD.  She presented to [facility] ER via EMS 12/26 with worsening shortness of breath, dyspnea with exertion, cough, and PND onset of symptoms 2-3 days prior to presentation to the ER.  Upon EMS arrival her O2 sats were 82% on RA, the pt does not wear home O2.  In the ER CXR revealed pulmonary edema, therefore she received IV lasix.  She was subsequently admitted to the telemetry unit by hospitalist team for further workup and treatment.  Pt also found to be bacteremic secondary to H. Influenza.  On 12/27 Cardizem gtt initiated due to pt developing atrial fibrillation with rvr.  She required transfer to the stepdown unit 12/27 due to worsening acute on chronic hypoxic respiratory failure requiring Bipap.    PAST MEDICAL HISTORY :   has a past medical history of Atrial fibrillation ([place]) and Hypertension.  has no past surgical history on file. Prior to Admission medications   Medication Sig Start Date End Date Taking? Authorizing Provider  dofetilide (TIKOSYN) 250 MCG capsule Take 250 mcg by mouth 2 (two) times daily.   Yes [provider]  metoprolol succinate (TOPROL-XL) 100  MG 24 hr tablet Take 100 mg by mouth daily. 11/19/16  Yes [provider]  rivaroxaban (XARELTO) 20 MG TABS tablet Take 20 mg by mouth daily. 10/27/16  Yes [provider]  levofloxacin (LEVAQUIN) 500 MG tablet Take 1 tablet (500 mg total) by mouth daily. Patient not taking: Reported on 12/31/2016 02/09/16   Coral Spikes, DO   No Known Allergies  FAMILY HISTORY:  family history is not on file. SOCIAL HISTORY:  reports that  has never smoked. she has never used smokeless tobacco. She reports that she does not drink alcohol.  REVIEW OF SYSTEMS: Positives in BOLD   Constitutional: Negative for fever, chills, weight loss, malaise/fatigue and diaphoresis.  HENT: Negative for hearing loss, ear pain, nosebleeds, congestion, sore throat, neck pain, tinnitus and ear discharge.   Eyes: Negative for blurred vision, double vision, photophobia, pain, discharge and redness.  Respiratory: cough, hemoptysis, sputum production, shortness of breath, wheezing and stridor.   Cardiovascular: Negative for chest pain, palpitations, orthopnea, claudication, leg swelling and PND.  Gastrointestinal: Negative for heartburn, nausea, vomiting, abdominal pain, diarrhea, constipation, blood in stool and melena.  Genitourinary: Negative for dysuria, urgency, frequency, hematuria and flank pain.  Musculoskeletal: Negative for myalgias, back pain, joint pain and falls.  Skin: Negative for itching and rash.  Neurological: Negative for dizziness, tingling, tremors, sensory change, speech change, focal weakness, seizures, loss of consciousness, weakness and headaches.  Endo/Heme/Allergies: Negative for environmental allergies and polydipsia. Does not bruise/bleed easily.  SUBJECTIVE:  Pt currently on NRB no complaints at this time  VITAL  SIGNS: Temp:  [97.7 F (36.5 C)-98.3 F (36.8 C)] 97.7 F (36.5 C) (12/27 0751) Pulse Rate:  [86-182] 113 (12/27 1830) Resp:  [16-28] 28 (12/27 1214) BP:  (51-138)/(41-94) 118/94 (12/27 1830) SpO2:  [92 %-99 %] 94 % (12/27 1321) Weight:  [72.7 kg (160 lb 3.2 oz)] 72.7 kg (160 lb 3.2 oz) (12/27 0309)  PHYSICAL EXAMINATION: General: acutely ill appearing elderly female Neuro: alert and oriented, follows commands, PERRLA HEENT: supple, no JVD  Cardiovascular: irregular irregular, no M Lungs: rhonchi throughout, even, non labored  Abdomen: +BS x4, soft, non tender, non distended  Musculoskeletal: normal bulk and tone, no edema  Skin: intact no rashes or lesions   Recent Labs  Lab 12/31/16 0915 01/01/17 0010  NA 137 138  K 3.7 3.8  CL 103 104  CO2 24 27  BUN 52* 56*  CREATININE 1.66* 1.70*  GLUCOSE 89 122*   Recent Labs  Lab 12/31/16 0915 01/01/17 0010  HGB 11.2* 10.7*  HCT 35.5 34.3*  WBC 14.2* 12.5*  PLT 240 257   Dg Chest Portable 1 View  Result Date: 12/31/2016 CLINICAL DATA:  Cough for 2 days. EXAM: PORTABLE CHEST 1 VIEW COMPARISON:  02/21/2016 . FINDINGS: Cardiomegaly with diffuse bilateral interstitial prominence and small bilateral pleural effusions consistent with CHF. No pneumothorax. Cervical spine fusion. Degenerative changes scoliosis thoracic spine . IMPRESSION: Cardiomegaly with diffuse bilateral interstitial prominence of small bilateral pleural effusions consistent with CHF . Electronically Signed   By: Marcello Moores  Register   On: 12/31/2016 09:29    ASSESSMENT / PLAN: Acute on chronic hypoxic respiratory failure secondary to CHF exacerbation and Pneumonia   H. Influenza Bacteremia  Atrial Fibrillation with RVR Elevated troponin's likely secondary to demand ischemia  Acute renal failure  Hx: HTN P: Prn Bipap for dyspnea and/or hypoxia Maintain O2 sats 88% to 92% Stat CXR results pending  Continue iv lasix  Continuous telemetry monitoring  Echo pending  Continue cardizem gtt  Cardiology consulted appreciate input Trend WBC and monitor fever curve Trend PCT  Follow cultures Continue current abx will add  azithromycin  Xarelto for VTE prophylaxis Trend CBC Monitor for s/sx of bleeding  Transfuse per usual guidelines  Trend BMP  Replace electrolytes as indicated Monitor UOP  Marda Stalker, [place] Pager [phone] (please enter 7 digits) PCCM Consult Pager [phone] (please enter 7 digits)

## 2017-01-01 NOTE — Progress Notes (Signed)
Pt transferred to ICU 12

## 2017-01-01 NOTE — Progress Notes (Signed)
Patient becoming restless, pulled out IV, oxygen saturations in 80s. MD paged and notified. Patient placed on nonrebreather, O2 sats now in 90s. MD to call family to discuss goals of care.

## 2017-01-01 NOTE — Care Management Note (Signed)
Case Management Note  Patient Details  Name: Mercedes Rodriguez MRN: [id number] Date of Birth: 01-21-1929  Subjective/Objective:                 Patient's son live with her.   She had onset of a cough 3 weeks ago that "just got worse ."  Current oxygen requirement is acute.  Patient has access to walker but does not use.  Denies she has any problems accessing medical care, obtaining medications or with transportation.  She would be agreeable to have home  health physical therapy and or nurse if needed. Palliative consult is pending.  Chronic Xarelto  Action/Plan:  Discussed the need to wean and or perform home 02 assessment prior to discharge  Expected Discharge Date:  01/02/17               Expected Discharge Plan:     In-House Referral:     Discharge planning Services     Post Acute Care Choice:    Choice offered to:     DME Arranged:    DME Agency:     HH Arranged:    HH Agency:     Status of Service:     If discussed at [employer], dates discussed:    Additional Comments:  Katrina Stack, RN 01/01/2017, 1:55 PM

## 2017-01-01 NOTE — Consult Note (Signed)
[facility] Consultation Note  Patient ID: Mercedes Rodriguez, MRN: [id number], DOB/AGE: 1929/07/28 83 y.o. Admit date: 12/31/2016   Date of Consult: 01/01/2017 Primary Physician: Lynnell Jude, MD Primary Cardiologist: [facility]  Chief Complaint:  Chief Complaint  Patient presents with  . Cough   Reason for Consult: Atrial fibrillation  HPI: 83 y.o. female with known paroxysmal nonvalvular atrial fibrillation with essential hypertension who has had recent significant concerns due to family members death and has not been taking her medications appropriately.  Additionally the patient has had recent infection for which she is short of breath with cough congestion and fever.  She has thus had significant atrial fibrillation with rapid ventricular rate requiring further treatment.  The patient was placed on diltiazem drip with some moderate heart rate control and also has previously been on metoprolol.  The patient additionally has had furosemide for intermittent lower extremity edema for which he appears to be slightly controlled at this time.  She additionally has been on Xarelto for further risk reduction and stroke with atrial fibrillation but may need additional changes in that dosage due to changes in creatinine.  With a glomerular filtration rate below 50 the patient should have a dose change to 15 mg and would discontinue altogether if the patient has a glomerular filtration rate below 15.  Currently the patient has severe cough congestion and feels a weak and fatigued.  There is no evidence of congestive heart failure or true angina or other EKG changes at this time  Past Medical History:  Diagnosis Date  . Atrial fibrillation ([place])   . Hypertension       Surgical History: History reviewed. No pertinent surgical history.   Home Meds: Prior to Admission medications   Medication Sig Start Date End Date Taking? Authorizing Provider  metoprolol succinate (TOPROL-XL) 100 MG 24 hr  tablet Take 100 mg by mouth daily. 11/19/16  Yes [provider]  rivaroxaban (XARELTO) 20 MG TABS tablet Take 20 mg by mouth daily. 10/27/16  Yes [provider]  levofloxacin (LEVAQUIN) 500 MG tablet Take 1 tablet (500 mg total) by mouth daily. Patient not taking: Reported on 12/31/2016 02/09/16   Coral Spikes, DO    Inpatient Medications:  . furosemide  20 mg Intravenous Q12H  . metoprolol succinate  100 mg Oral Daily  . potassium chloride  40 mEq Oral Q4H  . rivaroxaban  20 mg Oral Q supper  . sodium chloride flush  3 mL Intravenous Q12H   . sodium chloride    . cefTRIAXone (ROCEPHIN)  IV 2 g (01/01/17 1255)  . diltiazem (CARDIZEM) infusion 12.5 mg/hr (01/01/17 1254)    Allergies: No Known Allergies  Social History   Socioeconomic History  . Marital status: Widowed    Spouse name: Not on file  . Number of children: Not on file  . Years of education: Not on file  . Highest education level: Not on file  Social Needs  . Financial resource strain: Not on file  . Food insecurity - worry: Not on file  . Food insecurity - inability: Not on file  . Transportation needs - medical: Not on file  . Transportation needs - non-medical: Not on file  Occupational History  . Not on file  Tobacco Use  . Smoking status: Never Smoker  . Smokeless tobacco: Never Used  Substance and Sexual Activity  . Alcohol use: No  . Drug use: Not on file  . Sexual activity: Not on  file  Other Topics Concern  . Not on file  Social History Narrative  . Not on file     History reviewed. No pertinent family history.   Review of Systems Positive for shortness of breath cough congestion Negative for: General:  chills, fever, night sweats or weight changes.  Cardiovascular: PND orthopnea syncope dizziness  Dermatological skin lesions rashes Respiratory: Positive for cough congestion Urologic: Frequent urination urination at night and hematuria Abdominal: negative for nausea,  vomiting, diarrhea, bright red blood per rectum, melena, or hematemesis Neurologic: negative for visual changes, and/or hearing changes  All other systems reviewed and are otherwise negative except as noted above.  Labs: Recent Labs    12/31/16 0915 12/31/16 1304 12/31/16 1755 01/01/17 0010  TROPONINI 0.08* 0.07* 0.05* 0.06*   Lab Results  Component Value Date   WBC 12.5 (H) 01/01/2017   HGB 10.7 (L) 01/01/2017   HCT 34.3 (L) 01/01/2017   MCV 83.7 01/01/2017   PLT 257 01/01/2017    Recent Labs  Lab 12/31/16 0915 01/01/17 0010  NA 137 138  K 3.7 3.8  CL 103 104  CO2 24 27  BUN 52* 56*  CREATININE 1.66* 1.70*  CALCIUM 8.6* 8.3*  PROT 7.2  --   BILITOT 1.8*  --   ALKPHOS 84  --   ALT 21  --   AST 49*  --   GLUCOSE 89 122*   No results found for: CHOL, HDL, LDLCALC, TRIG No results found for: DDIMER  Radiology/Studies:  Dg Chest Portable 1 View  Result Date: 12/31/2016 CLINICAL DATA:  Cough for 2 days. EXAM: PORTABLE CHEST 1 VIEW COMPARISON:  02/21/2016 . FINDINGS: Cardiomegaly with diffuse bilateral interstitial prominence and small bilateral pleural effusions consistent with CHF. No pneumothorax. Cervical spine fusion. Degenerative changes scoliosis thoracic spine . IMPRESSION: Cardiomegaly with diffuse bilateral interstitial prominence of small bilateral pleural effusions consistent with CHF . Electronically Signed   By: Marcello Moores  Register   On: 12/31/2016 09:29    EKG: Atrial fibrillation with rapid ventricular rate nonspecific ST and T wave changes  Weights: Filed Weights   12/31/16 0916 12/31/16 1206 01/01/17 0309  Weight: 68.9 kg (152 lb) 74.6 kg (164 lb 8 oz) 72.7 kg (160 lb 3.2 oz)     Physical Exam: Blood pressure (!) 112/56, pulse (!) 138, temperature 97.7 F (36.5 C), temperature source Oral, resp. rate (!) 28, height 5\' 3"  (1.6 m), weight 72.7 kg (160 lb 3.2 oz), SpO2 93 %. Body mass index is 28.38 kg/m. General: Well developed, well nourished, in  no acute distress. Head eyes ears nose throat: Normocephalic, atraumatic, sclera non-icteric, no xanthomas, nares are without discharge. No apparent thyromegaly and/or mass  Lungs: Normal respiratory effort.  no wheezes, few basilar rales, few's rhonchi.  Heart: Irregular with normal S1 S2. no murmur gallop, no rub, PMI is normal size and placement, carotid upstroke normal without bruit, jugular venous pressure is normal Abdomen: Soft, non-tender, non-distended with normoactive bowel sounds. No hepatomegaly. No rebound/guarding. No obvious abdominal masses. Abdominal aorta is normal size without bruit Extremities: Trace edema. no cyanosis, no clubbing, no ulcers  Peripheral : 2+ bilateral upper extremity pulses, 2+ bilateral femoral pulses, 2+ bilateral dorsal pedal pulse Neuro: Alert and oriented. No facial asymmetry. No focal deficit. Moves all extremities spontaneously. Musculoskeletal: Normal muscle tone without kyphosis Psych:  Responds to questions appropriately with a normal affect.    Assessment: 83 year old female with essential hypertension mixed hyperlipidemia paroxysmal nonvalvular atrial fibrillation with rapid rate  likely secondary to recent infection and noncompliance  Plan: 1.  Reinstatement of metoprolol orally if the patient is able to take due to her current infection 2.  Other treatment including diltiazem drip with control of heart rate below 120bpm knowing that the patient has severe shortness of breath and infection driving her heart rate for which will not be perfectly controlled until infection is resolved 3.  Continue anticoagulation but adjusting dose as necessary to reduce bleeding complications as per above 4.  Furosemide for lower extremity edema as necessary 5.  Treatment of infection with antibiotics and supportive care next line 6.  No further cardiac diagnostics necessary at this time  Signed, Corey Skains M.D. [facility] 01/01/2017,  1:04 PM

## 2017-01-01 NOTE — Progress Notes (Signed)
Pt had continued worsening through out the day.  More lethargic and respi distress.Now on NRBM. Vitals- consistent with Hypoxia, Hypotension, tachycardia.  Assessment and Plan  * Sepsis  Ac respi failure    IV rocephin   Cont NRBM< move to stepdown with BIPAP   Xray chest stat.  I had repeated discussions with her grand daughter and nurse. Explained family about very rapidly worsening situation and pt's wishes of DNR. Also discussed treatment options including trial of BIPAP and Abx vs comfort care.  Finally they agreed to try Bipap. I explained this may be a short time, before she may rapidly go downword again. They understands and want to keep her DNR if she worsens.  Also discussed with Dr. Alva Garnet on phone.  Additional critical care time spent 50 min.

## 2017-01-01 NOTE — Progress Notes (Signed)
Patients BP 62/41, Cardizem gtt stopped, MD paged. Grandchildren at bedside and communicating with MD via telephone.

## 2017-01-01 NOTE — Consult Note (Signed)
Pharmacy Antibiotic Note  Mercedes Rodriguez is a 83 y.o. female admitted on 12/31/2016 with bacteremia.  Pharmacy has been consulted for ceftriaxone dosing.  Plan: ceftriaxone 2g q 24 hours  Height: 5\' 3"  (160 cm) Weight: 160 lb 3.2 oz (72.7 kg) IBW/kg (Calculated) : 52.4  Temp (24hrs), Avg:98 F (36.7 C), Min:97.7 F (36.5 C), Max:98.3 F (36.8 C)  Recent Labs  Lab 12/31/16 0915 01/01/17 0010  WBC 14.2* 12.5*  CREATININE 1.66* 1.70*    Estimated Creatinine Clearance: 23.1 mL/min (A) (by C-G formula based on SCr of 1.7 mg/dL (H)).    No Known Allergies  Antimicrobials this admission: ceftriaxone 12/27 >>    Dose adjustments this admission:   Microbiology results: 12/26 BCx: haemophilus influenzae   Thank you for allowing pharmacy to be a part of this patient's care.  Ramond Dial, Pharm.D, BCPS Clinical Pharmacist  01/01/2017 12:09 PM

## 2017-01-01 NOTE — Progress Notes (Addendum)
[place] [facility] [place] NAME: Mercedes Rodriguez    MR#:  [id number]  DATE OF BIRTH:  02-Jul-1929  SUBJECTIVE:  CHIEF COMPLAINT:   Chief Complaint  Patient presents with  . Cough   Came with SOB< last few days had URI symptoms. Noted to have A fib with RVR and CHF. Also had Blood cx reported to be positive. Pt had high oxygen demand and worsening.  REVIEW OF SYSTEMS:  CONSTITUTIONAL: No fever, fatigue or weakness.  EYES: No blurred or double vision.  EARS, NOSE, AND THROAT: No tinnitus or ear pain.  RESPIRATORY: No cough,Had shortness of breath, wheezing ,no  hemoptysis.  CARDIOVASCULAR: No chest pain, orthopnea, edema.  GASTROINTESTINAL: No nausea, vomiting, diarrhea or abdominal pain.  GENITOURINARY: No dysuria, hematuria.  ENDOCRINE: No polyuria, nocturia,  HEMATOLOGY: No anemia, easy bruising or bleeding SKIN: No rash or lesion. MUSCULOSKELETAL: No joint pain or arthritis.   NEUROLOGIC: No tingling, numbness, weakness.  PSYCHIATRY: No anxiety or depression.   ROS  DRUG ALLERGIES:  No Known Allergies  VITALS:  Blood pressure (!) 118/94, pulse (!) 113, temperature 97.7 F (36.5 C), temperature source Oral, resp. rate (!) 28, height 5\' 3"  (1.6 m), weight 72.7 kg (160 lb 3.2 oz), SpO2 94 %.  PHYSICAL EXAMINATION:  GENERAL:  83 y.o.-year-old patient lying in the bed with acute distress.  EYES: Pupils equal, round, reactive to light and accommodation. No scleral icterus. Extraocular muscles intact.  HEENT: Head atraumatic, normocephalic. Oropharynx and nasopharynx clear.  NECK:  Supple, no jugular venous distention. No thyroid enlargement, no tenderness.  LUNGS: decrease breath sounds bilaterally, no wheezing, rales,b/l crepitation. Positive for use of accessory muscles of respiration. Nasal canula oxygen. CARDIOVASCULAR: S1, S2 fast and regular. No murmurs, rubs, or gallops.  ABDOMEN: Soft, nontender, nondistended. Bowel sounds present. No  organomegaly or mass.  EXTREMITIES: No pedal edema, cyanosis, or clubbing.  NEUROLOGIC: Cranial nerves II through XII are intact. Muscle strength 4-5/5 in all extremities. Sensation intact. Gait not checked.  PSYCHIATRIC: The patient is alert and oriented x 1-2.  SKIN: No obvious rash, lesion, or ulcer.   Physical Exam LABORATORY PANEL:   CBC Recent Labs  Lab 01/01/17 0010  WBC 12.5*  HGB 10.7*  HCT 34.3*  PLT 257   ------------------------------------------------------------------------------------------------------------------  Chemistries  Recent Labs  Lab 12/31/16 0915 01/01/17 0010  NA 137 138  K 3.7 3.8  CL 103 104  CO2 24 27  GLUCOSE 89 122*  BUN 52* 56*  CREATININE 1.66* 1.70*  CALCIUM 8.6* 8.3*  AST 49*  --   ALT 21  --   ALKPHOS 84  --   BILITOT 1.8*  --    ------------------------------------------------------------------------------------------------------------------  Cardiac Enzymes Recent Labs  Lab 12/31/16 1755 01/01/17 0010  TROPONINI 0.05* 0.06*   ------------------------------------------------------------------------------------------------------------------  RADIOLOGY:  Dg Chest Portable 1 View  Result Date: 12/31/2016 CLINICAL DATA:  Cough for 2 days. EXAM: PORTABLE CHEST 1 VIEW COMPARISON:  02/21/2016 . FINDINGS: Cardiomegaly with diffuse bilateral interstitial prominence and small bilateral pleural effusions consistent with CHF. No pneumothorax. Cervical spine fusion. Degenerative changes scoliosis thoracic spine . IMPRESSION: Cardiomegaly with diffuse bilateral interstitial prominence of small bilateral pleural effusions consistent with CHF . Electronically Signed   By: Marcello Moores  Register   On: 12/31/2016 09:29    ASSESSMENT AND PLAN:   Active Problems:   CHF (congestive heart failure) ([place])  83 year old female with a history of persistent atrial fibrillation, chronic diastolic heart failure with preserved  ejection fraction who  presents with shortness of breath.    1. Acute hypoxic respiratory failure in the setting of acute CHF exacerbation.  On 5-6 ltr oxygen.  2. Acute on chronic diastolic heart failure with preserved ejection fraction: Lasix 20 IV every 12   Still had worsening respi status , so given one dose of lasix extra. Continue to monitor intake and output with daily weight Continue metoprolol Patient is followed at [facility] cardiology. Cardiology consult. Monitor renal func.  3. Persistent atrial fibrillation with RVR: Continue Xarelto and metoprolol.  Started on cardizem IV drip, later also given one dose IV digoxine.  4. Elevated troponin: This may be due to demand ischemia. Continue to trend troponins Continue telemetry  5. Sepsis   Presented with worsening hypoxia, Tachycardia, elevated WBCs, bacteremia   Have H. INfluenza bacteremia   IV rocephin, follow bl cx.  6. Ac renal failure   Monitor with lasix use.  I had discussed this with her son in room in morning, explained critical situation and poor prognosis, She remains DNR>    All the records are reviewed and case discussed with Care Management/Social Workerr. Management plans discussed with the patient, family and they are in agreement.  CODE STATUS: DNR  TOTAL TIME TAKING CARE OF THIS PATIENT: 35 critical care minutes.     POSSIBLE D/C IN 2-3 DAYS, DEPENDING ON CLINICAL CONDITION.   Vaughan Basta M.D on 01/01/2017   Between 7am to 6pm - Pager - [phone]  After 6pm go to [email or web address] - password [email or web address] [facility]  Office  [phone]  CC: Primary care physician; Lynnell Jude, MD  Note: This dictation was prepared with Dragon dictation along with smaller phrase technology. Any transcriptional errors that result from this process are unintentional.

## 2017-01-02 ENCOUNTER — Inpatient Hospital Stay: Payer: Medicare HMO

## 2017-01-02 DIAGNOSIS — R0902 Hypoxemia: Secondary | ICD-10-CM

## 2017-01-02 DIAGNOSIS — I4891 Unspecified atrial fibrillation: Secondary | ICD-10-CM

## 2017-01-02 DIAGNOSIS — I509 Heart failure, unspecified: Secondary | ICD-10-CM

## 2017-01-02 DIAGNOSIS — Z515 Encounter for palliative care: Secondary | ICD-10-CM

## 2017-01-02 LAB — BASIC METABOLIC PANEL
Anion gap: 7 (ref 5–15)
BUN: 70 mg/dL — AB (ref 6–20)
CHLORIDE: 104 mmol/L (ref 101–111)
CO2: 27 mmol/L (ref 22–32)
Calcium: 8.3 mg/dL — ABNORMAL LOW (ref 8.9–10.3)
Creatinine, Ser: 2.32 mg/dL — ABNORMAL HIGH (ref 0.44–1.00)
GFR calc Af Amer: 21 mL/min — ABNORMAL LOW (ref >60)
GFR calc non Af Amer: 18 mL/min — ABNORMAL LOW (ref >60)
Glucose, Bld: 140 mg/dL — ABNORMAL HIGH (ref 65–99)
POTASSIUM: 4.7 mmol/L (ref 3.5–5.1)
SODIUM: 138 mmol/L (ref 135–145)

## 2017-01-02 LAB — CBC
HEMATOCRIT: 32.3 % — AB (ref 35.0–47.0)
HEMOGLOBIN: 9.9 g/dL — AB (ref 12.0–16.0)
MCH: 26 pg (ref 26.0–34.0)
MCHC: 30.6 g/dL — ABNORMAL LOW (ref 32.0–36.0)
MCV: 84.9 fL (ref 80.0–100.0)
Platelets: 240 10*3/uL (ref 150–440)
RBC: 3.8 MIL/uL (ref 3.80–5.20)
RDW: 16.9 % — ABNORMAL HIGH (ref 11.5–14.5)
WBC: 10.2 10*3/uL (ref 3.6–11.0)

## 2017-01-02 MED ORDER — ALBUTEROL SULFATE (2.5 MG/3ML) 0.083% IN NEBU
2.5000 mg | INHALATION_SOLUTION | Freq: Four times a day (QID) | RESPIRATORY_TRACT | Status: DC | PRN
  Administered 2017-01-03 (×2): 2.5 mg via RESPIRATORY_TRACT
  Filled 2017-01-02 (×2): qty 3

## 2017-01-02 MED ORDER — FUROSEMIDE 10 MG/ML IJ SOLN
20.0000 mg | Freq: Every day | INTRAMUSCULAR | Status: DC
  Administered 2017-01-03 – 2017-01-05 (×3): 20 mg via INTRAVENOUS
  Filled 2017-01-02 (×3): qty 2

## 2017-01-02 MED ORDER — MORPHINE SULFATE (PF) 2 MG/ML IV SOLN: 1.0000 mg | INTRAVENOUS | Status: DC | PRN

## 2017-01-02 NOTE — Progress Notes (Signed)
[place] at [place] NAME: Mercedes Rodriguez    MR#:  [id number]  DATE OF BIRTH:  31-Oct-1929  SUBJECTIVE:  CHIEF COMPLAINT:   Chief Complaint  Patient presents with  . Cough   Came with SOB< last few days had URI symptoms. Noted to have A fib with RVR and CHF. Also had Blood cx reported to be positive. Pt had high oxygen demand and worsening, so transferred to [facility] unit with NRBM. Today on nasal canula oxygen, still very lethargic and worsened renal func.  REVIEW OF SYSTEMS:  Pt is letahrgic.  ROS  DRUG ALLERGIES:  No Known Allergies  VITALS:  Blood pressure 95/63, pulse (!) 119, temperature 98.2 F (36.8 C), temperature source Axillary, resp. rate (!) 25, height 5\' 2"  (1.575 m), weight 73.8 kg (162 lb 11.2 oz), SpO2 98 %.  PHYSICAL EXAMINATION:  GENERAL:  83 y.o.-year-old patient lying in the bed with acute distress, critical appearing.  EYES: Pupils equal, round, reactive to light and accommodation. No scleral icterus. Extraocular muscles intact.  HEENT: Head atraumatic, normocephalic. Oropharynx and nasopharynx clear.  NECK:  Supple, no jugular venous distention. No thyroid enlargement, no tenderness.  LUNGS: decrease breath sounds bilaterally, no wheezing, rales,b/l crepitation. Positive for use of accessory muscles of respiration. Nasal canula oxygen. CARDIOVASCULAR: S1, S2 fast and regular. No murmurs, rubs, or gallops.  ABDOMEN: Soft, nontender, nondistended. Bowel sounds present. No organomegaly or mass.  EXTREMITIES: No pedal edema, cyanosis, or clubbing.  NEUROLOGIC: lethargic, opens eyes to stimuli, does not follow commands, does not move limbs.  PSYCHIATRIC: The patient is lethargic.  SKIN: No obvious rash, lesion, or ulcer.   Physical Exam LABORATORY PANEL:   CBC Recent Labs  Lab 01/02/17 0530  WBC 10.2  HGB 9.9*  HCT 32.3*  PLT 240    ------------------------------------------------------------------------------------------------------------------  Chemistries  Recent Labs  Lab 12/31/16 0915  01/02/17 0530  NA 137   < > 138  K 3.7   < > 4.7  CL 103   < > 104  CO2 24   < > 27  GLUCOSE 89   < > 140*  BUN 52*   < > 70*  CREATININE 1.66*   < > 2.32*  CALCIUM 8.6*   < > 8.3*  AST 49*  --   --   ALT 21  --   --   ALKPHOS 84  --   --   BILITOT 1.8*  --   --    < > = values in this interval not displayed.   ------------------------------------------------------------------------------------------------------------------  Cardiac Enzymes Recent Labs  Lab 12/31/16 1755 01/01/17 0010  TROPONINI 0.05* 0.06*   ------------------------------------------------------------------------------------------------------------------  RADIOLOGY:  Dg Chest 1 View  Result Date: 01/01/2017 CLINICAL DATA:  Hypoxia EXAM: CHEST 1 VIEW COMPARISON:  December 31, 2016 FINDINGS: There is an overall increase in interstitial edema. There is focal consolidation in the right lower lobe and more patchy airspace consolidation in the left base. There is a mixture of interstitial and alveolar opacity in the right upper lobe. There is cardiomegaly with pulmonary venous hypertension. There are small pleural effusions bilaterally. No evident adenopathy. There is postoperative change in the lower cervical spine. IMPRESSION: Overall slight increase in interstitial edema compared to 1 day prior. The overall appearance is felt to be consistent with congestive heart failure with questionable superimposed pneumonia in the right upper lobe and bilateral base regions. Electronically Signed   By: Lowella Grip III M.D.  On: 01/01/2017 20:43   Dg Chest Port 1 View  Result Date: 01/02/2017 CLINICAL DATA:  Respiratory failure EXAM: PORTABLE CHEST 1 VIEW COMPARISON:  January 01, 2017 FINDINGS: There has been partial clearing of consolidation from the  right and left bases. Patchy atelectasis remains in lung bases. There is mild underlying interstitial edema. There is cardiomegaly with pulmonary venous hypertension. No new opacity. No adenopathy evident. There is postoperative change in lower cervical spine. IMPRESSION: Partial clearing of consolidation in the bases with patchy atelectasis remaining in the bases. There is cardiomegaly with pulmonary venous hypertension and mild interstitial edema. No new opacity. Electronically Signed   By: Lowella Grip III M.D.   On: 01/02/2017 06:57    ASSESSMENT AND PLAN:   Active Problems:   CHF (congestive heart failure) (HCC)   Hypoxia   Atrial fibrillation with RVR (HCC)   Palliative care encounter  83 year old female with a history of persistent atrial fibrillation, chronic diastolic heart failure with preserved ejection fraction who presents with shortness of breath.    1. Acute hypoxic respiratory failure in the setting of acute CHF exacerbation.  On nasal canula oxygen.   Due to worsening moved to stepdown unit, need palliative care eval.  2. Acute on chronic diastolic heart failure with preserved ejection fraction: Lasix 20 IV every 12 , but now worsening renal func, so changed to daily.  Continue to monitor intake and output with daily weight Continue metoprolol Patient is followed at [facility] cardiology. Cardiology consult. Monitor renal func.  3. Persistent atrial fibrillation with RVR: Continue Xarelto and metoprolol.  Started on cardizem IV drip, later also given one dose IV digoxine.  part of this is also due to infection.  4. Elevated troponin: This may be due to demand ischemia. Continue to trend troponins Continue telemetry  5. Sepsis   Presented with worsening hypoxia, Tachycardia, elevated WBCs, bacteremia   Have H. INfluenza bacteremia   IV rocephin, follow bl cx.  6. Ac renal failure   Monitor with lasix use.   Worsening.  Palliative care to meet with family  today to discuss comfort care option.prognosis very poor.   All the records are reviewed and case discussed with Care Management/Social Workerr. Management plans discussed with the patient, family and they are in agreement.  CODE STATUS: DNR  TOTAL TIME TAKING CARE OF THIS PATIENT: 35 minutes.    POSSIBLE D/C IN 2-3 DAYS, DEPENDING ON CLINICAL CONDITION.   Vaughan Basta M.D on 01/02/2017   Between 7am to 6pm - Pager - [phone]  After 6pm go to [email or web address] - password [email or web address] [facility]  Office  [phone]  CC: Primary care physician; Lynnell Jude, MD  Note: This dictation was prepared with Dragon dictation along with smaller phrase technology. Any transcriptional errors that result from this process are unintentional.

## 2017-01-02 NOTE — Progress Notes (Signed)
No charge note  PMT meeting with grand daughters this afternoon at 1:30 pm.    After talking with Clara on the phone this morning, I anticipate they will opt for full comfort.  Unfortunately the patient's daughter died last week.  Florentina Jenny, PA-C Palliative Medicine Pager: [phone]

## 2017-01-02 NOTE — Consult Note (Signed)
Pharmacy Antibiotic Note  Mercedes Rodriguez is a 83 y.o. female admitted on 12/31/2016 with bacteremia.  Pharmacy has been consulted for ceftriaxone dosing.  Plan: Will continue ceftriaxone 2g q 24 hours.  Height: 5\' 2"  (157.5 cm) Weight: 162 lb 11.2 oz (73.8 kg) IBW/kg (Calculated) : 50.1  Temp (24hrs), Avg:98.1 F (36.7 C), Min:97.9 F (36.6 C), Max:98.4 F (36.9 C)  Recent Labs  Lab 12/31/16 0915 01/01/17 0010 01/02/17 0530  WBC 14.2* 12.5* 10.2  CREATININE 1.66* 1.70* 2.32*    Estimated Creatinine Clearance: 16.7 mL/min (A) (by C-G formula based on SCr of 2.32 mg/dL (H)).    No Known Allergies  Antimicrobials this admission: ceftriaxone 12/27 >>    Dose adjustments this admission:   Microbiology results: 12/26 BCx: haemophilus influenzae beta-lactamase positive    Thank you for allowing pharmacy to be a part of this patient's care.  Ulice Dash, PharmD Clinical Pharmacist  01/02/2017 1:54 PM

## 2017-01-02 NOTE — Consult Note (Signed)
Consultation Note Date: 01/02/2017   Patient Name: Mercedes Rodriguez  DOB: October 11, 1929  MRN: [id number]  Age / Sex: 83 y.o., female  PCP: Mercedes Jude, MD Referring Physician: Vaughan Rodriguez, *  Reason for Consultation: Establishing goals of care  HPI/Patient Profile: 83 y.o. female  with past medical history of atrial fibrillation and diastolic heart failure who was admitted on 12/31/2016 with acute respiratory failure secondary to acute on chronic heart failure exacerbation. Microbiology is positive for H. Influenza bacteremia. The patient has not done well with treatment.  On 12/28 she is requiring bipap for respiratory support.  Her pulse rate is becoming better controlled on amiodarone, but her BP is soft.  She does not wake on my exam.   Clinical Assessment and Goals of Care:  I have reviewed medical records including EPIC notes, labs and imaging, received report from the care team, assessed the patient and then met at the bedside along with her grand daughters, her brother and sister in law to discuss diagnosis prognosis, Mercedes Rodriguez, EOL wishes, disposition and options.  I introduced Palliative Medicine as specialized medical care for people living with serious illness. It focuses on providing relief from the symptoms and stress of a serious illness. The goal is to improve quality of life for both the patient and the family.  We discussed a brief life review of the patient. Unfortunately the patient's daughter died and was buried less than a week ago.  Her son is local but unable to participate in  [place] conversations.  He has told the patient's grand daughters, "you make the decisions".    The patient enjoys watching game shows (Family Feud) and soap operas (Young and the Restless) on TV.   She has a loving and supportive family.  She is [place] and derives support from her faith.  As far as functional and  nutritional status Mercedes Rodriguez believes her grandmother stopped eating and taking her medications when her daughter died.  We discussed the patient's current illness and what it means in the larger context of her on-going co-morbidities.  Natural disease trajectory and expectations at EOL were discussed.  Specifically, we discussed her H. Influenza bacteremia, afb with RVR, low blood pressure and respiratory status.  The family accepts that she is very fragile and that we need to re-assess day by day.  Dr. Alva Rodriguez suggested the following plan which the family has accepted.  No further bipap, no further diagnostics.  Continue amiodarone, continue antibiotics.  When the patient develops respiratory distress again we will utilize PRN morphine for symptom control rather than bipap.  Family agreeable to re-assess day by day.   They are aware her prognosis is poor.   Primary Decision Maker:  NEXT OF KIN Grand Dtrs (Mercedes Rodriguez and Mercedes Rodriguez)    SUMMARY OF RECOMMENDATIONS     Monitor in step down, continue antibiotics and amiodarone.  Do not escalate respiratory support.  PRN comfort meds.  Family advised to enjoy the patient and help make her comfortable.  Poor prognosis.  Code Status/Advance Care Planning:  DNR   Psycho-social/Spiritual:   Desire for further Chaplaincy support: yes  Prognosis:  Days to weeks given diastolic heart failure, afib with RVR, depression, poor nutritional status.    Discharge Planning: To Be Determined      Primary Diagnoses: Present on Admission: **None**   I have reviewed the medical record, interviewed the patient and family, and examined the patient. The following aspects are pertinent.  Past Medical History:  Diagnosis Date  . Atrial fibrillation (Mercedes Rodriguez)   . Hypertension    Social History   Socioeconomic History  . Marital status: Widowed    Spouse name: None  . Number of children: None  . Years of education: None  . Highest education level: None    Social Needs  . Financial resource strain: None  . Food insecurity - worry: None  . Food insecurity - inability: None  . Transportation needs - medical: None  . Transportation needs - non-medical: None  Occupational History  . None  Tobacco Use  . Smoking status: Never Smoker  . Smokeless tobacco: Never Used  Substance and Sexual Activity  . Alcohol use: No  . Drug use: None  . Sexual activity: None  Other Topics Concern  . None  Social History Narrative  . None   History reviewed. No pertinent family history. Scheduled Meds: . chlorhexidine  15 mL Mouth Rinse BID  . mouth rinse  15 mL Mouth Rinse q12n4p  . metoprolol succinate  100 mg Oral Daily  . rivaroxaban  15 mg Oral Q supper  . sodium chloride flush  3 mL Intravenous Q12H   Continuous Infusions: . sodium chloride    . amiodarone 30 mg/hr (01/02/17 0601)  . azithromycin Stopped (01/01/17 2259)  . cefTRIAXone (ROCEPHIN)  IV Stopped (01/01/17 1341)   PRN Meds:.sodium chloride, acetaminophen **OR** acetaminophen, HYDROcodone-acetaminophen, ketorolac, metoprolol tartrate, ondansetron **OR** ondansetron (ZOFRAN) IV, senna-docusate, sodium chloride flush No Known Allergies Review of Systems patient reports cough, fatigue, weakness.  Denies pain.  Physical Exam  Well dev female, on bipap, does not respond to my voice or touch cv tachy resp on bipap - no distress Abdomen soft, nt, nd Ext trace edema.   Vital Signs: BP 94/63   Pulse (!) 103   Temp 98.4 F (36.9 C) (Axillary)   Resp (!) 27   Ht _0  (1.575 m)   Wt 73.8 kg (162 lb 11.2 oz)   SpO2 98%   BMI 29.76 kg/m  Pain Assessment: No/denies pain       SpO2: SpO2: 98 % O2 Device:SpO2: 98 % O2 Flow Rate: .O2 Flow Rate (L/min): 15 L/min  IO: Intake/output summary:   Intake/Output Summary (Last 24 hours) at 01/02/2017 0844 Last data filed at 01/02/2017 8768 Gross per 24 hour  Intake 498.57 ml  Output 749 ml  Net -250.43 ml    LBM: Last BM Date:  12/31/16 Baseline Weight: Weight: 68.9 kg (152 lb) Most recent weight: Weight: 73.8 kg (162 lb 11.2 oz)     Palliative Assessment/Data: 20%     Time In: 1:00 Time Out: 2:10 Time Total: 70 min. Greater than 50%  of this time was spent counseling and coordinating care related to the above assessment and plan.  Signed by: Florentina Jenny, PA-C Palliative Medicine Pager: [phone]  Please contact Palliative Medicine Team phone at [phone] for questions and concerns.  For individual provider: See Shea Evans

## 2017-01-02 NOTE — Progress Notes (Signed)
Patient taken off of BiPap and placed on 6l nasal cannula. Pt's saturation observed for a few minutes and oxygen was weaned to 4 liters due to sats of 97% on 6l. Pt tol well at this time, will continue to monitor.

## 2017-01-03 ENCOUNTER — Inpatient Hospital Stay: Payer: Medicare HMO

## 2017-01-03 LAB — BASIC METABOLIC PANEL
ANION GAP: 8 (ref 5–15)
BUN: 68 mg/dL — ABNORMAL HIGH (ref 6–20)
CALCIUM: 8.3 mg/dL — AB (ref 8.9–10.3)
CO2: 27 mmol/L (ref 22–32)
CREATININE: 2.07 mg/dL — AB (ref 0.44–1.00)
Chloride: 98 mmol/L — ABNORMAL LOW (ref 101–111)
GFR, EST AFRICAN AMERICAN: 24 mL/min — AB (ref >60)
GFR, EST NON AFRICAN AMERICAN: 21 mL/min — AB (ref >60)
GLUCOSE: 122 mg/dL — AB (ref 65–99)
Potassium: 3.9 mmol/L (ref 3.5–5.1)
Sodium: 133 mmol/L — ABNORMAL LOW (ref 135–145)

## 2017-01-03 MED ORDER — GUAIFENESIN-DM 100-10 MG/5ML PO SYRP
5.0000 mL | ORAL_SOLUTION | ORAL | Status: DC | PRN
  Administered 2017-01-03 – 2017-01-06 (×2): 5 mL via ORAL
  Filled 2017-01-03 (×4): qty 5

## 2017-01-03 MED ORDER — DIGOXIN 0.25 MG/ML IJ SOLN
0.2500 mg | Freq: Once | INTRAMUSCULAR | Status: AC
  Administered 2017-01-03: 0.25 mg via INTRAVENOUS
  Filled 2017-01-03: qty 3

## 2017-01-03 NOTE — Progress Notes (Signed)
[place] at [place] NAME: Mercedes Rodriguez    MR#:  [id number]  DATE OF BIRTH:  03-19-1929  SUBJECTIVE:  CHIEF COMPLAINT:   Chief Complaint  Patient presents with  . Cough   Came with SOB< last few days had URI symptoms. Noted to have A fib with RVR and CHF. Also had Blood cx reported to be positive. Pt had high oxygen demand and worsening, so transferred to [facility] unit with NRBM. Today on nasal canula oxygen, looks more alert today.  REVIEW OF SYSTEMS:  Pt is alert, but critical.  ROS  DRUG ALLERGIES:  No Known Allergies  VITALS:  Blood pressure 120/83, pulse (!) 135, temperature 98.2 F (36.8 C), temperature source Axillary, resp. rate (!) 27, height 5\' 2"  (1.575 m), weight 73.8 kg (162 lb 11.2 oz), SpO2 94 %.  PHYSICAL EXAMINATION:  GENERAL:  83 y.o.-year-old patient lying in the bed with no acute distress.  EYES: Pupils equal, round, reactive to light and accommodation. No scleral icterus. Extraocular muscles intact.  HEENT: Head atraumatic, normocephalic. Oropharynx and nasopharynx clear.  NECK:  Supple, no jugular venous distention. No thyroid enlargement, no tenderness.  LUNGS: decrease breath sounds bilaterally, no wheezing, rales,b/l crepitation. no use of accessory muscles of respiration. Nasal canula oxygen. CARDIOVASCULAR: S1, S2 fast and regular. No murmurs, rubs, or gallops.  ABDOMEN: Soft, nontender, nondistended. Bowel sounds present. No organomegaly or mass.  EXTREMITIES: No pedal edema, cyanosis, or clubbing.  NEUROLOGIC: alert, follows simple commands and answers a few questions PSYCHIATRIC: The patient is alert and oriented X2.  SKIN: No obvious rash, lesion, or ulcer.   Physical Exam LABORATORY PANEL:   CBC Recent Labs  Lab 01/02/17 0530  WBC 10.2  HGB 9.9*  HCT 32.3*  PLT 240    ------------------------------------------------------------------------------------------------------------------  Chemistries  Recent Labs  Lab 12/31/16 0915  01/03/17 0501  NA 137   < > 133*  K 3.7   < > 3.9  CL 103   < > 98*  CO2 24   < > 27  GLUCOSE 89   < > 122*  BUN 52*   < > 68*  CREATININE 1.66*   < > 2.07*  CALCIUM 8.6*   < > 8.3*  AST 49*  --   --   ALT 21  --   --   ALKPHOS 84  --   --   BILITOT 1.8*  --   --    < > = values in this interval not displayed.   ------------------------------------------------------------------------------------------------------------------  Cardiac Enzymes Recent Labs  Lab 12/31/16 1755 01/01/17 0010  TROPONINI 0.05* 0.06*   ------------------------------------------------------------------------------------------------------------------  RADIOLOGY:  Dg Chest 1 View  Result Date: 01/01/2017 CLINICAL DATA:  Hypoxia EXAM: CHEST 1 VIEW COMPARISON:  December 31, 2016 FINDINGS: There is an overall increase in interstitial edema. There is focal consolidation in the right lower lobe and more patchy airspace consolidation in the left base. There is a mixture of interstitial and alveolar opacity in the right upper lobe. There is cardiomegaly with pulmonary venous hypertension. There are small pleural effusions bilaterally. No evident adenopathy. There is postoperative change in the lower cervical spine. IMPRESSION: Overall slight increase in interstitial edema compared to 1 day prior. The overall appearance is felt to be consistent with congestive heart failure with questionable superimposed pneumonia in the right upper lobe and bilateral base regions. Electronically Signed   By: Lowella Grip III M.D.   On: 01/01/2017 20:43  Dg Chest Port 1 View  Result Date: 01/03/2017 CLINICAL DATA:  Respiratory failure. EXAM: PORTABLE CHEST 1 VIEW COMPARISON:  One-view chest x-ray 01/02/2017 FINDINGS: The heart enlarged. Diffuse interstitial and  airspace disease is similar the prior study. IMPRESSION: Cardiomegaly with scratched 1. Similar appearance of cardiomegaly with pulmonary vascular congestion and edema compatible with congestive heart failure. 2. Bibasilar airspace densities likely reflect atelectasis and effusion. Electronically Signed   By: San Morelle M.D.   On: 01/03/2017 07:03   Dg Chest Port 1 View  Result Date: 01/02/2017 CLINICAL DATA:  Respiratory failure EXAM: PORTABLE CHEST 1 VIEW COMPARISON:  January 01, 2017 FINDINGS: There has been partial clearing of consolidation from the right and left bases. Patchy atelectasis remains in lung bases. There is mild underlying interstitial edema. There is cardiomegaly with pulmonary venous hypertension. No new opacity. No adenopathy evident. There is postoperative change in lower cervical spine. IMPRESSION: Partial clearing of consolidation in the bases with patchy atelectasis remaining in the bases. There is cardiomegaly with pulmonary venous hypertension and mild interstitial edema. No new opacity. Electronically Signed   By: Lowella Grip III M.D.   On: 01/02/2017 06:57    ASSESSMENT AND PLAN:   Active Problems:   CHF (congestive heart failure) (HCC)   Hypoxia   Atrial fibrillation with RVR (HCC)   Palliative care encounter  83 year old female with a history of persistent atrial fibrillation, chronic diastolic heart failure with preserved ejection fraction who presents with shortness of breath.    1. Acute hypoxic respiratory failure in the setting of acute CHF exacerbation.  On nasal canula oxygen.   Due to worsening moved to stepdown unit, palliative care eval.   Slow improvement, cont nasal canula. Family wants to wait for a day or 2 for improvement.  2. Acute on chronic diastolic heart failure with preserved ejection fraction: Lasix 20 IV every 12 , but now worsening renal func, so changed to daily.  Continue to monitor intake and output with daily  weight Continue metoprolol Patient is [facility] at [facility] cardiology. Cardiology consult. Monitor renal func.  3. Persistent atrial fibrillation with RVR: Continue Xarelto and metoprolol.  Started on cardizem IV drip, later also given one dose IV digoxine.  part of this is also due to infection.   Now intencivist changed to amiodarone iv drip.  4. Elevated troponin: This may be due to demand ischemia. Continue to trend troponins Continue telemetry  5. Sepsis   Presented with worsening hypoxia, Tachycardia, elevated WBCs, bacteremia   Have H. INfluenza bacteremia   IV rocephin, follow bl cx.   6. Ac renal failure   Monitor with lasix use.   Worsening.   All the records are reviewed and case discussed with Care Management/Social Workerr. Management plans discussed with the patient, family and they are in agreement.  CODE STATUS: DNR  TOTAL TIME TAKING CARE OF THIS PATIENT: 35 minutes.    POSSIBLE D/C IN 2-3 DAYS, DEPENDING ON CLINICAL CONDITION.   Vaughan Basta M.D on 01/03/2017   Between 7am to 6pm - Pager - [phone]  After 6pm go to [email or web address] - password [email or web address] [facility]  Office  [phone]  CC: Primary care physician; Lynnell Jude, MD  Note: This dictation was prepared with Dragon dictation along with smaller phrase technology. Any transcriptional errors that result from this process are unintentional.

## 2017-01-03 NOTE — Progress Notes (Signed)
Notified Dr. Humphrey Rolls with cardiology that patient remains on 33.3 ml rate of amiodarone and HR keeps climbing to 140's 150's afib on monitor.  He ordered digoxin 0.25mg  IV once at this time.

## 2017-01-03 NOTE — Progress Notes (Signed)
Patient is transferred from ICU to room 248. Alert and oriented x 3. Denied any acute pain at this time. Amiodarone drip running at 33.3 with HR in the 90s. The patient is oriented to her room ascom/call bell. Cardiac monitoring called to CCMD  and verified. Bed alarm activated and the bed is in the lowest position. Will continue to monitor.

## 2017-01-03 NOTE — Consult Note (Signed)
Name: Mercedes Rodriguez MRN: [id number] DOB: 12/21/1929    ADMISSION DATE:  12/31/2016 CONSULTATION DATE: 01/01/2017  REFERRING MD : Dr. Anselm Jungling   CHIEF COMPLAINT: Shortness of Breath   BRIEF PATIENT DESCRIPTION:  83 yo female admitted 12/26 to telemetry unit with acute on chronic hypoxic respiratory failure secondary to CHF exacerbation and questionable pneumonia, atrial fibrillation with rvr requiring cardizem gtt, and H. Influenza bacteremia.  Transferred to [facility] 12/27 due to acute encephalopathy and worsening respiratory failure requiring Bipap   SIGNIFICANT EVENTS  12/26-Pt admitted to telemetry unit  12/27-Pt transferred to stepdown unit   STUDIES:  None   HISTORY OF PRESENT ILLNESS:   This is an 83 yo female with a PMH of HTN, Chronic Diastolic CHF, Chronic Atrial Fibrillation (on xarelto), and GERD.  She presented to [facility] via EMS 12/26 with worsening shortness of breath, dyspnea with exertion, cough, and PND onset of symptoms 2-3 days prior to presentation to the ER.  Upon EMS arrival her O2 sats were 82% on RA, the pt does not wear home O2.  In the ER CXR revealed pulmonary edema, therefore she received IV lasix.  She was subsequently admitted to the telemetry unit by hospitalist team for further workup and treatment.  Pt also found to be bacteremic secondary to H. Influenza.  On 12/27 Cardizem gtt initiated due to pt developing atrial fibrillation with rvr.  She required transfer to the stepdown unit 12/27 due to worsening acute on chronic hypoxic respiratory failure requiring Bipap.    Now off BIPAP No overt resp distress   REVIEW OF SYSTEMS: +SOB but improved Other ROS neg    VITAL SIGNS: Temp:  [98.2 F (36.8 C)-99 F (37.2 C)] 98.6 F (37 C) (12/29 0000) Pulse Rate:  [46-139] 139 (12/29 0600) Resp:  [18-33] 33 (12/29 0600) BP: (86-140)/(56-89) 90/74 (12/29 0500) SpO2:  [79 %-99 %] 95 % (12/29 0600)  PHYSICAL EXAMINATION: General:  ill appearing  elderly female Neuro: alert and oriented, follows commands, PERRLA HEENT: supple, no JVD  Cardiovascular: irregular irregular, no M Lungs: rhonchi throughout, even, non labored  Abdomen: +BS x4, soft, non tender, non distended  Musculoskeletal: normal bulk and tone, no edema  Skin: intact no rashes or lesions   Recent Labs  Lab 01/01/17 0010 01/02/17 0530 01/03/17 0501  NA 138 138 133*  K 3.8 4.7 3.9  CL 104 104 98*  CO2 27 27 27   BUN 56* 70* 68*  CREATININE 1.70* 2.32* 2.07*  GLUCOSE 122* 140* 122*   Recent Labs  Lab 12/31/16 0915 01/01/17 0010 01/02/17 0530  HGB 11.2* 10.7* 9.9*  HCT 35.5 34.3* 32.3*  WBC 14.2* 12.5* 10.2  PLT 240 257 240    ASSESSMENT / PLAN: Acute on chronic hypoxic respiratory failure secondary to CHF exacerbation and Pneumonia   H. Influenza Bacteremia  Atrial Fibrillation with RVR Elevated troponin's likely secondary to demand ischemia  Acute renal failure  Hx: HTN P: Maintain O2 sats 88% to 92% Continue iv lasix  Continue cardizem gtt  Cardiology consulted appreciate input Trend WBC and monitor fever curve Trend PCT  Follow cultures Continue current abx  Xarelto for VTE prophylaxis Trend CBC Monitor for s/sx of bleeding  Transfuse per usual guidelines  Trend BMP  Replace electrolytes as indicated Monitor UOP   plan to wean off dilt drip as per cardiology  Corrin Parker, M.D.  [facility] Pulmonary & Critical Care Medicine  Medical Director [place] Director [facility] Cardio-Pulmonary Department

## 2017-01-03 NOTE — Progress Notes (Signed)
SUBJECTIVE: Patient is feeling palpitation   Vitals:   01/03/17 1730 01/03/17 1800 01/03/17 1815 01/03/17 1830  BP: 138/70 110/70  125/75  Pulse: (!) 120 (!) 124 (!) 124 (!) 144  Resp: (!) 28 (!) 22 (!) 24 (!) 24  Temp: 98.6 F (37 C)     TempSrc: Axillary     SpO2: 94% 96% 95% 91%  Weight:      Height:        Intake/Output Summary (Last 24 hours) at 01/03/2017 1853 Last data filed at 01/03/2017 1744 Gross per 24 hour  Intake 276.84 ml  Output 1625 ml  Net -1348.16 ml    LABS: Basic Metabolic Panel: Recent Labs    01/02/17 0530 01/03/17 0501  NA 138 133*  K 4.7 3.9  CL 104 98*  CO2 27 27  GLUCOSE 140* 122*  BUN 70* 68*  CREATININE 2.32* 2.07*  CALCIUM 8.3* 8.3*   Liver Function Tests: No results for input(s): AST, ALT, ALKPHOS, BILITOT, PROT, ALBUMIN in the last 72 hours. No results for input(s): LIPASE, AMYLASE in the last 72 hours. CBC: Recent Labs    01/01/17 0010 01/02/17 0530  WBC 12.5* 10.2  HGB 10.7* 9.9*  HCT 34.3* 32.3*  MCV 83.7 84.9  PLT 257 240   Cardiac Enzymes: Recent Labs    01/01/17 0010  TROPONINI 0.06*   BNP: Invalid input(s): POCBNP D-Dimer: No results for input(s): DDIMER in the last 72 hours. Hemoglobin A1C: No results for input(s): HGBA1C in the last 72 hours. Fasting Lipid Panel: No results for input(s): CHOL, HDL, LDLCALC, TRIG, CHOLHDL, LDLDIRECT in the last 72 hours. Thyroid Function Tests: No results for input(s): TSH, T4TOTAL, T3FREE, THYROIDAB in the last 72 hours.  Invalid input(s): FREET3 Anemia Panel: No results for input(s): VITAMINB12, FOLATE, FERRITIN, TIBC, IRON, RETICCTPCT in the last 72 hours.   PHYSICAL EXAM General: Well developed, well nourished, in no acute distress HEENT:  Normocephalic and atramatic Neck:  No JVD.  Lungs: Clear bilaterally to auscultation and percussion. Heart: HRRR . Normal S1 and S2 without gallops or murmurs.  Abdomen: Bowel sounds are positive, abdomen soft and non-tender   Msk:  Back normal, normal gait. Normal strength and tone for age. Extremities: No clubbing, cyanosis or edema.   Neuro: Alert and oriented X 3. Psych:  Good affect, responds appropriately  TELEMETRY: Atrial fibrillation with rapid ventricular  ASSESSMENT AND PLAN: Atrial fibrillation with rapid ventricular response rate still on amiodarone drip and add digoxin 0.25 IV once and if needed another time and 6-8 hours.  Active Problems:   CHF (congestive heart failure) (HCC)   Hypoxia   Atrial fibrillation with RVR [facility])   Palliative care encounter    Dionisio David, MD, [facility] 01/03/2017 6:53 PM

## 2017-01-03 NOTE — Progress Notes (Signed)
Patient transported to 2A. Report given prior to transfer to [place], [place].

## 2017-01-04 LAB — CREATININE, SERUM
Creatinine, Ser: 1.33 mg/dL — ABNORMAL HIGH (ref 0.44–1.00)
GFR calc non Af Amer: 35 mL/min — ABNORMAL LOW (ref >60)
GFR, EST AFRICAN AMERICAN: 41 mL/min — AB (ref >60)

## 2017-01-04 MED ORDER — AMIODARONE HCL 200 MG PO TABS
400.0000 mg | ORAL_TABLET | Freq: Two times a day (BID) | ORAL | Status: DC
  Administered 2017-01-04 – 2017-01-08 (×9): 400 mg via ORAL
  Filled 2017-01-04 (×9): qty 2

## 2017-01-04 NOTE — Progress Notes (Signed)
SUBJECTIVive patient denies any chest pain or shortness of breath   Vitals:   01/03/17 2000 01/03/17 2107 01/04/17 0527 01/04/17 0847  BP: 115/76 100/61 133/82 127/66  Pulse: (!) 112 62 62 (!) 126  Resp: (!) 25 18 18 18   Temp:  97.8 F (36.6 C) 98.4 F (36.9 C) 98.4 F (36.9 C)  TempSrc:  Oral Oral Oral  SpO2: 98% 96% 91% 98%  Weight:  166 lb 0.1 oz (75.3 kg) 168 lb 10.4 oz (76.5 kg)   Height:  5\' 4"  (1.626 m)      Intake/Output Summary (Last 24 hours) at 01/04/2017 1122 Last data filed at 01/04/2017 0700 Gross per 24 hour  Intake 788.76 ml  Output 1300 ml  Net -511.24 ml    LABS: Basic Metabolic Panel: Recent Labs    01/02/17 0530 01/03/17 0501 01/04/17 0623  NA 138 133*  --   K 4.7 3.9  --   CL 104 98*  --   CO2 27 27  --   GLUCOSE 140* 122*  --   BUN 70* 68*  --   CREATININE 2.32* 2.07* 1.33*  CALCIUM 8.3* 8.3*  --    Liver Function Tests: No results for input(s): AST, ALT, ALKPHOS, BILITOT, PROT, ALBUMIN in the last 72 hours. No results for input(s): LIPASE, AMYLASE in the last 72 hours. CBC: Recent Labs    01/02/17 0530  WBC 10.2  HGB 9.9*  HCT 32.3*  MCV 84.9  PLT 240   Cardiac Enzymes: No results for input(s): CKTOTAL, CKMB, CKMBINDEX, TROPONINI in the last 72 hours. BNP: Invalid input(s): POCBNP D-Dimer: No results for input(s): DDIMER in the last 72 hours. Hemoglobin A1C: No results for input(s): HGBA1C in the last 72 hours. Fasting Lipid Panel: No results for input(s): CHOL, HDL, LDLCALC, TRIG, CHOLHDL, LDLDIRECT in the last 72 hours. Thyroid Function Tests: No results for input(s): TSH, T4TOTAL, T3FREE, THYROIDAB in the last 72 hours.  Invalid input(s): FREET3 Anemia Panel: No results for input(s): VITAMINB12, FOLATE, FERRITIN, TIBC, IRON, RETICCTPCT in the last 72 hours.   PHYSICAL EXAM General: Well developed, well nourished, in no acute distress HEENT:  Normocephalic and atramatic Neck:  No JVD.  Lungs: Clear bilaterally to  auscultation and percussion. Heart: HRRR . Normal S1 and S2 without gallops or murmurs.  Abdomen: Bowel sounds are positive, abdomen soft and non-tender  Msk:  Back normal, normal gait. Normal strength and tone for age. Extremities: No clubbing, cyanosis or edema.   Neuro: Alert and oriented X 3. Psych:  Good affect, responds appropriately  TELEMETRY: Atrial fibrillation with controlled ventricular rate  ASSESSMENT AND PLAN: Atrial fibrillation with controlled ventricular rate. May have to give digoxin twice a week about 0.25 IV or by mouth. Continue amiodarone by mouth 400 twice a day.  Active Problems:   CHF (congestive heart failure) (HCC)   Hypoxia   Atrial fibrillation with RVR [facility])   Palliative care encounter    Dionisio David, MD, [facility] 01/04/2017 11:22 AM

## 2017-01-04 NOTE — Progress Notes (Signed)
Rounded with MD, pt resting in room comfortably, no complains through out shift, Amiodarone gtt stopped at 12:20 per MD order. Pt remains in afib and heart rate controlled. Will continue to monitor patient.

## 2017-01-04 NOTE — Progress Notes (Signed)
[place] at [place] NAME: Mercedes Rodriguez    MR#:  [id number]  DATE OF BIRTH:  08/30/1929  SUBJECTIVE:  CHIEF COMPLAINT:   Chief Complaint  Patient presents with  . Cough   Came with SOB< last few days had URI symptoms. Noted to have A fib with RVR and CHF. Also had Blood cx reported to be positive. Pt had high oxygen demand and worsening, so transferred to [facility] unit with NRBM. Today on nasal canula oxygen, looks more alert today. On nasal cannula oxygen and completely oriented now.  REVIEW OF SYSTEMS:    Review of Systems  Constitutional: Positive for malaise/fatigue. Negative for fever.  HENT: Negative for congestion, ear discharge and hearing loss.   Eyes: Negative for blurred vision and double vision.  Respiratory: Positive for cough and shortness of breath. Negative for sputum production.   Cardiovascular: Negative for chest pain, orthopnea and leg swelling.  Gastrointestinal: Negative for abdominal pain, heartburn, nausea and vomiting.  Genitourinary: Negative for frequency and urgency.  Musculoskeletal: Negative for back pain and joint pain.  Skin: Negative for rash.  Neurological: Positive for weakness. Negative for tremors, sensory change, focal weakness and headaches.  Psychiatric/Behavioral: Negative for depression.    DRUG ALLERGIES:  No Known Allergies  VITALS:  Blood pressure 115/68, pulse (!) 107, temperature 98.4 F (36.9 C), temperature source Oral, resp. rate 18, height 5\' 4"  (1.626 m), weight 76.5 kg (168 lb 10.4 oz), SpO2 98 %.  PHYSICAL EXAMINATION:  GENERAL:  83 y.o.-year-old patient lying in the bed with no acute distress.  EYES: Pupils equal, round, reactive to light and accommodation. No scleral icterus. Extraocular muscles intact.  HEENT: Head atraumatic, normocephalic. Oropharynx and nasopharynx clear.  NECK:  Supple, no jugular venous distention. No thyroid enlargement, no tenderness.  LUNGS: decrease  breath sounds bilaterally, no wheezing, rales,b/l crepitation. no use of accessory muscles of respiration. Nasal canula oxygen. CARDIOVASCULAR: S1, S2 fast and regular. No murmurs, rubs, or gallops.  ABDOMEN: Soft, nontender, nondistended. Bowel sounds present. No organomegaly or mass.  EXTREMITIES: No pedal edema, cyanosis, or clubbing.  NEUROLOGIC: alert, follows simple commands and answers a few questions, generalized weakenss. PSYCHIATRIC: The patient is alert and oriented X3.  SKIN: No obvious rash, lesion, or ulcer.   Physical Exam LABORATORY PANEL:   CBC Recent Labs  Lab 01/02/17 0530  WBC 10.2  HGB 9.9*  HCT 32.3*  PLT 240   ------------------------------------------------------------------------------------------------------------------  Chemistries  Recent Labs  Lab 12/31/16 0915  01/03/17 0501 01/04/17 0623  NA 137   < > 133*  --   K 3.7   < > 3.9  --   CL 103   < > 98*  --   CO2 24   < > 27  --   GLUCOSE 89   < > 122*  --   BUN 52*   < > 68*  --   CREATININE 1.66*   < > 2.07* 1.33*  CALCIUM 8.6*   < > 8.3*  --   AST 49*  --   --   --   ALT 21  --   --   --   ALKPHOS 84  --   --   --   BILITOT 1.8*  --   --   --    < > = values in this interval not displayed.   ------------------------------------------------------------------------------------------------------------------  Cardiac Enzymes Recent Labs  Lab 12/31/16 1755 01/01/17 0010  TROPONINI 0.05* 0.06*   ------------------------------------------------------------------------------------------------------------------  RADIOLOGY:  Dg Chest Port 1 View  Result Date: 01/03/2017 CLINICAL DATA:  Respiratory failure. EXAM: PORTABLE CHEST 1 VIEW COMPARISON:  One-view chest x-ray 01/02/2017 FINDINGS: The heart enlarged. Diffuse interstitial and airspace disease is similar the prior study. IMPRESSION: Cardiomegaly with scratched 1. Similar appearance of cardiomegaly with pulmonary vascular congestion  and edema compatible with congestive heart failure. 2. Bibasilar airspace densities likely reflect atelectasis and effusion. Electronically Signed   By: San Morelle M.D.   On: 01/03/2017 07:03    ASSESSMENT AND PLAN:   Active Problems:   CHF (congestive heart failure) (HCC)   Hypoxia   Atrial fibrillation with RVR (HCC)   Palliative care encounter  83 year old female with a history of persistent atrial fibrillation, chronic diastolic heart failure with preserved ejection fraction who presents with shortness of breath.    1. Acute hypoxic respiratory failure in the setting of acute CHF exacerbation.  On nasal canula oxygen.   Due to worsening moved to stepdown unit, palliative care eval.   Slow improvement, cont nasal canula.    Improved now, may be able to discharge in a day or 2 most likely will need rehabilitation. May need oxygen on discharge.  2. Acute on chronic diastolic heart failure with preserved ejection fraction: Lasix 20 IV every 12 , but now worsening renal func, so changed to daily.  Continue to monitor intake and output with daily weight Continue metoprolol Patient is [facility] at [facility] cardiology. Cardiology consult. Monitor renal func.  3. Persistent atrial fibrillation with RVR: Continue Xarelto and metoprolol.  Started on cardizem IV drip, later also given one dose IV digoxine.  part of this is also due to infection.   Now intencivist changed to amiodarone iv drip.   Changed to oral amiodarone now.  4. Elevated troponin: This may be due to demand ischemia. Continue to trend troponins Continue telemetry  5. Sepsis   Presented with worsening hypoxia, Tachycardia, elevated WBCs, bacteremia   Have H. INfluenza bacteremia   IV rocephin, follow bl cx.   Repeat cultures are negative.   6. Ac renal failure   Monitor with lasix use.  Was worsening initially, now started improving.   All the records are reviewed and case discussed with Care  Management/Social Workerr. Management plans discussed with the patient, family and they are in agreement.  CODE STATUS: DNR  TOTAL TIME TAKING CARE OF THIS PATIENT: 35 minutes.    POSSIBLE D/C IN 2-3 DAYS, DEPENDING ON CLINICAL CONDITION.   Vaughan Basta M.D on 01/04/2017   Between 7am to 6pm - Pager - [phone]  After 6pm go to [email or web address] - password [email or web address] [facility]  Office  [phone]  CC: Primary care physician; Lynnell Jude, MD  Note: This dictation was prepared with Dragon dictation along with smaller phrase technology. Any transcriptional errors that result from this process are unintentional.

## 2017-01-05 LAB — CBC
HEMATOCRIT: 35.4 % (ref 35.0–47.0)
HEMOGLOBIN: 11.1 g/dL — AB (ref 12.0–16.0)
MCH: 25.9 pg — ABNORMAL LOW (ref 26.0–34.0)
MCHC: 31.4 g/dL — AB (ref 32.0–36.0)
MCV: 82.6 fL (ref 80.0–100.0)
Platelets: 279 10*3/uL (ref 150–440)
RBC: 4.28 MIL/uL (ref 3.80–5.20)
RDW: 17.1 % — ABNORMAL HIGH (ref 11.5–14.5)
WBC: 18.9 10*3/uL — AB (ref 3.6–11.0)

## 2017-01-05 LAB — BASIC METABOLIC PANEL
ANION GAP: 7 (ref 5–15)
BUN: 33 mg/dL — ABNORMAL HIGH (ref 6–20)
CALCIUM: 8.2 mg/dL — AB (ref 8.9–10.3)
CO2: 32 mmol/L (ref 22–32)
Chloride: 99 mmol/L — ABNORMAL LOW (ref 101–111)
Creatinine, Ser: 1.24 mg/dL — ABNORMAL HIGH (ref 0.44–1.00)
GFR, EST AFRICAN AMERICAN: 45 mL/min — AB (ref >60)
GFR, EST NON AFRICAN AMERICAN: 38 mL/min — AB (ref >60)
Glucose, Bld: 117 mg/dL — ABNORMAL HIGH (ref 65–99)
POTASSIUM: 4 mmol/L (ref 3.5–5.1)
SODIUM: 138 mmol/L (ref 135–145)

## 2017-01-05 LAB — CULTURE, BLOOD (ROUTINE X 2): Culture: NO GROWTH

## 2017-01-05 MED ORDER — FUROSEMIDE 20 MG PO TABS
20.0000 mg | ORAL_TABLET | Freq: Every day | ORAL | Status: DC
  Administered 2017-01-06 – 2017-01-08 (×3): 20 mg via ORAL
  Filled 2017-01-05 (×3): qty 1

## 2017-01-05 MED ORDER — AMOXICILLIN-POT CLAVULANATE 875-125 MG PO TABS
1.0000 | ORAL_TABLET | Freq: Two times a day (BID) | ORAL | Status: DC
  Administered 2017-01-06 – 2017-01-08 (×5): 1 via ORAL
  Filled 2017-01-05 (×6): qty 1

## 2017-01-05 NOTE — Evaluation (Signed)
Physical Therapy Evaluation Patient Details Name: Mercedes Rodriguez MRN: [id number] DOB: [birth year] Today's Date: 01/05/2017   History of Present Illness  Pt admitted for CHF. History includes Afib and HTN. Hospital stay complicated due to admission to CCU for respiratory distress on 12/27. Pt now on telemetry with HR WNL; still complaints of SOB.  Clinical Impression  Pt is a pleasant 83 year old female who was admitted for CHF. Pt performs bed mobility with mod/max assist, transfers with mod assist, and unable to ambulate at this time. Pt demonstrates deficits with strength/endurance/mobility. With standing, pt got dizzy, O2 at 87% on 2L of O2 and HR increased to 129. Unable to re-attempt at this time. Pt is currently not at baseline level. Would benefit from skilled PT to address above deficits and promote optimal return to PLOF; recommend transition to STR upon discharge from acute hospitalization.       Follow Up Recommendations SNF    Equipment Recommendations  None recommended by PT    Recommendations for Other Services       Precautions / Restrictions Precautions Precautions: Fall Restrictions Weight Bearing Restrictions: No      Mobility  Bed Mobility Overal bed mobility: Needs Assistance Bed Mobility: Supine to Sit     Supine to sit: Mod assist;Max assist     General bed mobility comments: heavy assist to scoot hips out towards EOB. Pt loses balance to R side falling back in bed, needs mod assist to maintain balance. Able to improve to sitting with cga. Incontinent epsiode noted in bed.  Transfers Overall transfer level: Needs assistance Equipment used: Rolling walker (2 wheeled) Transfers: Sit to/from Stand Sit to Stand: Mod assist         General transfer comment: able to stand at bedside with RW, however feels dizzy upon standing and B LEs buckle with descent back to bed. Unable to further attempt.  Ambulation/Gait             General Gait  Details: unable at this time  Stairs            Wheelchair Mobility    Modified Rankin (Stroke Patients Only)       Balance Overall balance assessment: Needs assistance Sitting-balance support: Feet supported;Bilateral upper extremity supported Sitting balance-Leahy Scale: Fair     Standing balance support: Bilateral upper extremity supported Standing balance-Leahy Scale: Poor                               Pertinent Vitals/Pain Pain Assessment: No/denies pain    Home Living Family/patient expects to be discharged to:: Private residence Living Arrangements: Children(son) Available Help at Discharge: Family;Available PRN/intermittently Type of Home: House Home Access: Stairs to enter Entrance Stairs-Rails: Can reach both Entrance Stairs-Number of Steps: 2 Home Layout: Able to live on main level with bedroom/bathroom;Two level Home Equipment: Walker - 2 wheels;Bedside commode      Prior Function Level of Independence: Independent with assistive device(s)         Comments: was using RW occasionally, however most of the time was independent with mobility     Hand Dominance        Extremity/Trunk Assessment   Upper Extremity Assessment Upper Extremity Assessment: Generalized weakness(B UE grossly 3+/5)    Lower Extremity Assessment Lower Extremity Assessment: Generalized weakness(B LE grossly 3/5)       Communication   Communication: No difficulties  Cognition Arousal/Alertness: Awake/alert Behavior During Therapy:  WFL for tasks assessed/performed Overall Cognitive Status: Within Functional Limits for tasks assessed                                        General Comments      Exercises Other Exercises Other Exercises: supine ther-ex performed with written HEP provided. 5 reps of B LE performed including ankle pumps, quad sets, SLRs, and hip abd/add. All ther-ex performed with cga. Only able to tolerate 5 reps this  date.   Assessment/Plan    PT Assessment Patient needs continued PT services  PT Problem List Decreased strength;Decreased activity tolerance;Decreased balance;Decreased mobility       PT Treatment Interventions Gait training;DME instruction;Therapeutic exercise    PT Goals (Current goals can be found in the Care Plan section)  Acute Rehab PT Goals Patient Stated Goal: go to rehab PT Goal Formulation: With patient Time For Goal Achievement: 01/19/17 Potential to Achieve Goals: Good    Frequency Min 2X/week   Barriers to discharge        Co-evaluation               AM-PAC PT "6 Clicks" Daily Activity  Outcome Measure Difficulty turning over in bed (including adjusting bedclothes, sheets and blankets)?: Unable Difficulty moving from lying on back to sitting on the side of the bed? : Unable Difficulty sitting down on and standing up from a chair with arms (e.g., wheelchair, bedside commode, etc,.)?: Unable Help needed moving to and from a bed to chair (including a wheelchair)?: A Lot Help needed walking in hospital room?: A Lot Help needed climbing 3-5 steps with a railing? : Total 6 Click Score: 8    End of Session Equipment Utilized During Treatment: Gait belt;Oxygen Activity Tolerance: Patient limited by fatigue Patient left: in bed;with bed alarm set Nurse Communication: Mobility status PT Visit Diagnosis: Unsteadiness on feet (R26.81);Muscle weakness (generalized) (M62.81);Difficulty in walking, not elsewhere classified (R26.2)    Time: 7654-6503 PT Time Calculation (min) (ACUTE ONLY): 19 min   Charges:   PT Evaluation $PT Eval Low Complexity: 1 Low PT Treatments $Therapeutic Exercise: 8-22 mins   PT G Codes:   PT G-Codes **NOT FOR INPATIENT CLASS** Functional Assessment Tool Used: AM-PAC 6 Clicks Basic Mobility Functional Limitation: Mobility: Walking and moving around Mobility: Walking and Moving Around Current Status (T4656): At least 80 percent but  less than 100 percent impaired, limited or restricted Mobility: Walking and Moving Around Goal Status [phone]): At least 60 percent but less than 80 percent impaired, limited or restricted    Greggory Stallion, PT, DPT [phone]   Rae Sutcliffe 01/05/2017, 11:42 AM

## 2017-01-05 NOTE — Progress Notes (Signed)
SUBJECTIVE: Patient is much more alert and comfortable today   Vitals:   01/04/17 1708 01/04/17 2037 01/05/17 0326 01/05/17 0753  BP: (!) 155/72 (!) 154/68 130/71 137/87  Pulse: (!) 55 71 (!) 122 (!) 119  Resp: (!) 28 18 18 18   Temp: 98.3 F (36.8 C) 98.4 F (36.9 C) 98 F (36.7 C) 98.3 F (36.8 C)  TempSrc: Oral  Oral   SpO2: 92% 96% 94% 93%  Weight:      Height:        Intake/Output Summary (Last 24 hours) at 01/05/2017 2130 Last data filed at 01/05/2017 0500 Gross per 24 hour  Intake 347.6 ml  Output 800 ml  Net -452.4 ml    LABS: Basic Metabolic Panel: Recent Labs    01/03/17 0501 01/04/17 0623 01/05/17 0428  NA 133*  --  138  K 3.9  --  4.0  CL 98*  --  99*  CO2 27  --  32  GLUCOSE 122*  --  117*  BUN 68*  --  33*  CREATININE 2.07* 1.33* 1.24*  CALCIUM 8.3*  --  8.2*   Liver Function Tests: No results for input(s): AST, ALT, ALKPHOS, BILITOT, PROT, ALBUMIN in the last 72 hours. No results for input(s): LIPASE, AMYLASE in the last 72 hours. CBC: Recent Labs    01/05/17 0428  WBC 18.9*  HGB 11.1*  HCT 35.4  MCV 82.6  PLT 279   Cardiac Enzymes: No results for input(s): CKTOTAL, CKMB, CKMBINDEX, TROPONINI in the last 72 hours. BNP: Invalid input(s): POCBNP D-Dimer: No results for input(s): DDIMER in the last 72 hours. Hemoglobin A1C: No results for input(s): HGBA1C in the last 72 hours. Fasting Lipid Panel: No results for input(s): CHOL, HDL, LDLCALC, TRIG, CHOLHDL, LDLDIRECT in the last 72 hours. Thyroid Function Tests: No results for input(s): TSH, T4TOTAL, T3FREE, THYROIDAB in the last 72 hours.  Invalid input(s): FREET3 Anemia Panel: No results for input(s): VITAMINB12, FOLATE, FERRITIN, TIBC, IRON, RETICCTPCT in the last 72 hours.   PHYSICAL EXAM General: Well developed, well nourished, in no acute distress HEENT:  Normocephalic and atramatic Neck:  No JVD.  Lungs: Clear bilaterally to auscultation and percussion. Heart: HRRR .  Normal S1 and S2 without gallops or murmurs.  Abdomen: Bowel sounds are positive, abdomen soft and non-tender  Msk:  Back normal, normal gait. Normal strength and tone for age. Extremities: No clubbing, cyanosis or edema.   Neuro: Alert and oriented X 3. Psych:  Good affect, responds appropriately  TELEMETRY: Atrial fibrillation  ASSESSMENT AND PLAN: Atrial fibrillation with controlled ventricular rate which is much more controlled right now. May give digoxin 0.25 IV if heart rate continues to be high.  Active Problems:   CHF (congestive heart failure) (HCC)   Hypoxia   Atrial fibrillation with RVR [facility])   Palliative care encounter    Dionisio David, MD, [facility] 01/05/2017 9:22 AM

## 2017-01-05 NOTE — Progress Notes (Signed)
[place] at [place] NAME: Zanaria Morell    MR#:  [id number]  DATE OF BIRTH:  [birth year]  SUBJECTIVE:  CHIEF COMPLAINT:   Chief Complaint  Patient presents with  . Cough   Came with SOB< last few days had URI symptoms. Noted to have A fib with RVR and CHF. Also had Blood cx reported to be positive. Pt had high oxygen demand and worsening, so transferred to [facility] unit with NRBM. Today on nasal canula oxygen, looks more alert today. On nasal cannula oxygen and completely oriented now.  REVIEW OF SYSTEMS:    Review of Systems  Constitutional: Positive for malaise/fatigue. Negative for fever.  HENT: Negative for congestion, ear discharge and hearing loss.   Eyes: Negative for blurred vision and double vision.  Respiratory: Positive for cough and shortness of breath. Negative for sputum production.   Cardiovascular: Negative for chest pain, orthopnea and leg swelling.  Gastrointestinal: Negative for abdominal pain, heartburn, nausea and vomiting.  Genitourinary: Negative for frequency and urgency.  Musculoskeletal: Negative for back pain and joint pain.  Skin: Negative for rash.  Neurological: Positive for weakness. Negative for tremors, sensory change, focal weakness and headaches.  Psychiatric/Behavioral: Negative for depression.    DRUG ALLERGIES:  No Known Allergies  VITALS:  Blood pressure 137/87, pulse (!) 119, temperature 98.3 F (36.8 C), resp. rate 18, height 5\' 4"  (1.626 m), weight 76.5 kg (168 lb 10.4 oz), SpO2 93 %.  PHYSICAL EXAMINATION:  GENERAL:  83 y.o.-year-old patient lying in the bed with no acute distress.  EYES: Pupils equal, round, reactive to light and accommodation. No scleral icterus. Extraocular muscles intact.  HEENT: Head atraumatic, normocephalic. Oropharynx and nasopharynx clear.  NECK:  Supple, no jugular venous distention. No thyroid enlargement, no tenderness.  LUNGS: decrease breath sounds bilaterally,  no wheezing, rales,b/l crepitation. no use of accessory muscles of respiration. Nasal canula oxygen. CARDIOVASCULAR: S1, S2 fast and regular. No murmurs, rubs, or gallops.  ABDOMEN: Soft, nontender, nondistended. Bowel sounds present. No organomegaly or mass.  EXTREMITIES: No pedal edema, cyanosis, or clubbing.  NEUROLOGIC: alert, follows simple commands and answers a few questions, generalized weakenss. PSYCHIATRIC: The patient is alert and oriented X3.  SKIN: No obvious rash, lesion, or ulcer.   Physical Exam LABORATORY PANEL:   CBC Recent Labs  Lab 01/05/17 0428  WBC 18.9*  HGB 11.1*  HCT 35.4  PLT 279   ------------------------------------------------------------------------------------------------------------------  Chemistries  Recent Labs  Lab 12/31/16 0915  01/05/17 0428  NA 137   < > 138  K 3.7   < > 4.0  CL 103   < > 99*  CO2 24   < > 32  GLUCOSE 89   < > 117*  BUN 52*   < > 33*  CREATININE 1.66*   < > 1.24*  CALCIUM 8.6*   < > 8.2*  AST 49*  --   --   ALT 21  --   --   ALKPHOS 84  --   --   BILITOT 1.8*  --   --    < > = values in this interval not displayed.   ------------------------------------------------------------------------------------------------------------------  Cardiac Enzymes Recent Labs  Lab 12/31/16 1755 01/01/17 0010  TROPONINI 0.05* 0.06*   ------------------------------------------------------------------------------------------------------------------  RADIOLOGY:  No results found.  ASSESSMENT AND PLAN:   Active Problems:   CHF (congestive heart failure) (HCC)   Hypoxia   Atrial fibrillation with RVR ([place])   Palliative care encounter  83 year old female with a history of persistent atrial fibrillation, chronic diastolic heart failure with preserved ejection fraction who presents with shortness of breath.    1. Acute hypoxic respiratory failure in the setting of acute CHF exacerbation.  On nasal canula oxygen.   Due to  worsening moved to stepdown unit, palliative care eval.   Slow improvement, cont nasal canula.    Improved now, may be able to discharge in a day or 2 most likely will need rehabilitation. May need oxygen on discharge.  Due to significant improvement I will likely send her to rehabilitation with palliative care nurse to follow over there.  2. Acute on chronic diastolic heart failure with preserved ejection fraction: Lasix 20 IV every 12 , but now worsening renal func, so changed to daily.  Continue to monitor intake and output with daily weight Continue metoprolol Patient is followed at [facility] cardiology. Cardiology consult. Monitor renal func. Gradual improvement.  3. Persistent atrial fibrillation with RVR: Continue Xarelto and metoprolol.  Started on cardizem IV drip, later also given one dose IV digoxine.  part of this is also due to infection.   Now intencivist changed to amiodarone iv drip.   Changed to oral amiodarone. Monitor on telemetry.  4. Elevated troponin: This may be due to demand ischemia. Continue to trend troponins Continue telemetry  5. Sepsis   Presented with worsening hypoxia, Tachycardia, elevated WBCs, bacteremia   Have H. INfluenza bacteremia   IV rocephin, follow bl cx.   Repeat cultures are negative.  Changed to oral Augmentin.   6. Ac renal failure   Was worsening initially, now started improving.   Monitor with oral Lasix use now.   All the records are reviewed and case discussed with Care Management/Social Workerr. Management plans discussed with the patient, family and they are in agreement.  CODE STATUS: DNR  TOTAL TIME TAKING CARE OF THIS PATIENT: 35 minutes.  Correlated physical therapy evaluation for discharge.  POSSIBLE D/C IN 2-3 DAYS, DEPENDING ON CLINICAL CONDITION.   Vaughan Basta M.D on 01/05/2017   Between 7am to 6pm - Pager - [phone]  After 6pm go to [email or web address] - password [email or web address]  [facility]  Office  [phone]  CC: Primary care physician; Lynnell Jude, MD  Note: This dictation was prepared with Dragon dictation along with smaller phrase technology. Any transcriptional errors that result from this process are unintentional.

## 2017-01-06 LAB — BASIC METABOLIC PANEL
ANION GAP: 7 (ref 5–15)
BUN: 31 mg/dL — AB (ref 6–20)
CHLORIDE: 100 mmol/L — AB (ref 101–111)
CO2: 33 mmol/L — ABNORMAL HIGH (ref 22–32)
Calcium: 8.3 mg/dL — ABNORMAL LOW (ref 8.9–10.3)
Creatinine, Ser: 1.2 mg/dL — ABNORMAL HIGH (ref 0.44–1.00)
GFR calc Af Amer: 46 mL/min — ABNORMAL LOW (ref >60)
GFR calc non Af Amer: 40 mL/min — ABNORMAL LOW (ref >60)
GLUCOSE: 106 mg/dL — AB (ref 65–99)
POTASSIUM: 3.7 mmol/L (ref 3.5–5.1)
Sodium: 140 mmol/L (ref 135–145)

## 2017-01-06 LAB — CBC
HEMATOCRIT: 34.9 % — AB (ref 35.0–47.0)
HEMOGLOBIN: 10.9 g/dL — AB (ref 12.0–16.0)
MCH: 25.5 pg — AB (ref 26.0–34.0)
MCHC: 31.2 g/dL — ABNORMAL LOW (ref 32.0–36.0)
MCV: 81.8 fL (ref 80.0–100.0)
Platelets: 284 10*3/uL (ref 150–440)
RBC: 4.27 MIL/uL (ref 3.80–5.20)
RDW: 17 % — ABNORMAL HIGH (ref 11.5–14.5)
WBC: 13 10*3/uL — AB (ref 3.6–11.0)

## 2017-01-06 NOTE — Care Management Important Message (Signed)
Important Message  Patient Details  Name: Mercedes Rodriguez MRN: [id number] Date of Birth: 05-22-1929   Medicare Important Message Given:  Yes Signed IM notice given    Katrina Stack, RN 01/06/2017, 4:09 PM

## 2017-01-06 NOTE — Care Management (Signed)
Physical therapy has recommended snf as of eval 01/05/2017. Patient transitioned to oral antibiotics.  Attending anticipates patient can discharge within next 24 hours.  CSW aware. Will require insurance approval. Insurance companies are closed today so unable to initiate [place].

## 2017-01-06 NOTE — NC FL2 (Signed)
[place] LEVEL OF CARE SCREENING TOOL     IDENTIFICATION  Patient Name: Mercedes Rodriguez Birthdate: September 15, 1929 Sex: female Admission Date (Current Location): 12/31/2016  [place] and [place] Number:  Engineering geologist and Address:  [facility], [address], [place], [place] [address]      Provider Number: [id number]  Attending Physician Name and Address:  Vaughan Basta, *  Relative Name and Phone Number:     Himebaugh,gattis Son   [phone]   [name]   [place]   [phone]     Current Level of Care: Hospital Recommended Level of Care: [place] Prior Approval Number:    Date Approved/Denied:   PASRR Number: [id number]  Discharge Plan: SNF    Current Diagnoses: Patient Active Problem List   Diagnosis Date Noted  . Hypoxia   . Atrial fibrillation with RVR ([place])   . Palliative care encounter   . CHF (congestive heart failure) ([place]) 12/31/2016    Orientation RESPIRATION BLADDER Height & Weight     Self  O2 2L Continent Weight: 168 lb 10.4 oz (76.5 kg) Height:  5\' 4"  (162.6 cm)  BEHAVIORAL SYMPTOMS/MOOD NEUROLOGICAL BOWEL NUTRITION STATUS      Continent Diet  AMBULATORY STATUS COMMUNICATION OF NEEDS Skin   Limited Assist Verbally Normal                       Personal Care Assistance Level of Assistance  Bathing, Feeding, Dressing Bathing Assistance: Limited assistance Feeding assistance: Independent Dressing Assistance: Limited assistance     Functional Limitations Info  Sight, Hearing, Speech Sight Info: Adequate Hearing Info: Adequate Speech Info: Adequate    SPECIAL CARE FACTORS FREQUENCY  PT (By licensed PT)     PT Frequency: 5x a week              Contractures Contractures Info: Not present    Additional Factors Info  Code Status, Allergies Code Status Info: DNR Allergies Info: NKA           Current  Medications (01/06/2017):  This is the current hospital active medication list Current Facility-Administered Medications  Medication Dose Route Frequency Provider Last Rate Last Dose  . 0.9 %  sodium chloride infusion  250 mL Intravenous PRN Bettey Costa, MD      . acetaminophen (TYLENOL) tablet 650 mg  650 mg Oral Q6H PRN Bettey Costa, MD       Or  . acetaminophen (TYLENOL) suppository 650 mg  650 mg Rectal Q6H PRN Mody, Sital, MD      . albuterol (PROVENTIL) (2.5 MG/3ML) 0.083% nebulizer solution 2.5 mg  2.5 mg Nebulization Q6H PRN Dorene Sorrow S, NP   2.5 mg at 01/03/17 1232  . amiodarone (PACERONE) tablet 400 mg  400 mg Oral BID Neoma Laming A, MD   400 mg at 01/06/17 2878  . amoxicillin-clavulanate (AUGMENTIN) 875-125 MG per tablet 1 tablet  1 tablet Oral Q12H Vaughan Basta, MD   1 tablet at 01/06/17 0917  . chlorhexidine (PERIDEX) 0.12 % solution 15 mL  15 mL Mouth Rinse BID Vaughan Basta, MD   15 mL at 01/06/17 0918  . furosemide (LASIX) tablet 20 mg  20 mg Oral Daily Vaughan Basta, MD   20 mg at 01/06/17 0918  . guaiFENesin-dextromethorphan (ROBITUSSIN DM) 100-10 MG/5ML syrup 5 mL  5 mL Oral Q4H PRN Flora Lipps, MD   5 mL at 01/03/17 1825  .  HYDROcodone-acetaminophen (NORCO/VICODIN) 5-325 MG per tablet 1-2 tablet  1-2 tablet Oral Q4H PRN Bettey Costa, MD      . MEDLINE mouth rinse  15 mL Mouth Rinse q12n4p Vaughan Basta, MD   15 mL at 01/06/17 1545  . metoprolol succinate (TOPROL-XL) 24 hr tablet 100 mg  100 mg Oral Daily Bettey Costa, MD   100 mg at 01/06/17 0918  . metoprolol tartrate (LOPRESSOR) injection 5 mg  5 mg Intravenous Q2H PRN Harrie Foreman, MD   5 mg at 01/03/17 1842  . morphine 2 MG/ML injection 1-3 mg  1-3 mg Intravenous Q1H PRN Dellinger, Marianne L, PA-C      . ondansetron (ZOFRAN) injection 4 mg  4 mg Intravenous Q6H PRN Mody, Sital, MD      . Rivaroxaban (XARELTO) tablet 15 mg  15 mg Oral Q supper Vaughan Basta, MD   15  mg at 01/05/17 1618  . senna-docusate (Senokot-S) tablet 1 tablet  1 tablet Oral QHS PRN Mody, Sital, MD      . sodium chloride flush (NS) 0.9 % injection 3 mL  3 mL Intravenous Q12H Bettey Costa, MD   3 mL at 01/06/17 0919  . sodium chloride flush (NS) 0.9 % injection 3 mL  3 mL Intravenous PRN Bettey Costa, MD   3 mL at 12/31/16 1220     Discharge Medications: Please see discharge summary for a list of discharge medications.  Relevant Imaging Results:  Relevant Lab Results:   Additional Information SSN [id number]  Ross Ludwig, [place]

## 2017-01-06 NOTE — Progress Notes (Signed)
[place] at [place] NAME: Mercedes Rodriguez    MR#:  [id number]  DATE OF BIRTH:  Feb 01, 1929  SUBJECTIVE:  CHIEF COMPLAINT:   Chief Complaint  Patient presents with  . Cough    Came with SOB< last few days had URI symptoms. Noted to have A fib with RVR and CHF. Also had Blood cx reported to be positive. Pt had high oxygen demand and worsening, so transferred to [facility] unit with NRBM. Today on nasal canula oxygen, looks more alert today. On nasal cannula oxygen and completely oriented now.  REVIEW OF SYSTEMS:    Review of Systems  Constitutional: Positive for malaise/fatigue. Negative for fever.  HENT: Negative for congestion, ear discharge and hearing loss.   Eyes: Negative for blurred vision and double vision.  Respiratory: Positive for cough and shortness of breath. Negative for sputum production.   Cardiovascular: Negative for chest pain, orthopnea and leg swelling.  Gastrointestinal: Negative for abdominal pain, heartburn, nausea and vomiting.  Genitourinary: Negative for frequency and urgency.  Musculoskeletal: Negative for back pain and joint pain.  Skin: Negative for rash.  Neurological: Positive for weakness. Negative for tremors, sensory change, focal weakness and headaches.  Psychiatric/Behavioral: Negative for depression.    DRUG ALLERGIES:  No Known Allergies  VITALS:  Blood pressure 130/72, pulse (!) 56, temperature 98 F (36.7 C), temperature source Oral, resp. rate 18, height 5\' 4"  (1.626 m), weight 76.5 kg (168 lb 10.4 oz), SpO2 96 %.  PHYSICAL EXAMINATION:  GENERAL:  84 y.o.-year-old patient lying in the bed with no acute distress.  EYES: Pupils equal, round, reactive to light and accommodation. No scleral icterus. Extraocular muscles intact.  HEENT: Head atraumatic, normocephalic. Oropharynx and nasopharynx clear.  NECK:  Supple, no jugular venous distention. No thyroid enlargement, no tenderness.  LUNGS: decrease  breath sounds bilaterally, no wheezing, rales,b/l crepitation. no use of accessory muscles of respiration. Nasal canula oxygen. CARDIOVASCULAR: S1, S2 fast and regular. No murmurs, rubs, or gallops.  ABDOMEN: Soft, nontender, nondistended. Bowel sounds present. No organomegaly or mass.  EXTREMITIES: No pedal edema, cyanosis, or clubbing.  NEUROLOGIC: alert, follows simple commands and answers a few questions, generalized weakenss. PSYCHIATRIC: The patient is alert and oriented X3.  SKIN: No obvious rash, lesion, or ulcer.   Physical Exam LABORATORY PANEL:   CBC Recent Labs  Lab 01/06/17 0401  WBC 13.0*  HGB 10.9*  HCT 34.9*  PLT 284   ------------------------------------------------------------------------------------------------------------------  Chemistries  Recent Labs  Lab 12/31/16 0915  01/06/17 0401  NA 137   < > 140  K 3.7   < > 3.7  CL 103   < > 100*  CO2 24   < > 33*  GLUCOSE 89   < > 106*  BUN 52*   < > 31*  CREATININE 1.66*   < > 1.20*  CALCIUM 8.6*   < > 8.3*  AST 49*  --   --   ALT 21  --   --   ALKPHOS 84  --   --   BILITOT 1.8*  --   --    < > = values in this interval not displayed.   ------------------------------------------------------------------------------------------------------------------  Cardiac Enzymes Recent Labs  Lab 12/31/16 1755 01/01/17 0010  TROPONINI 0.05* 0.06*   ------------------------------------------------------------------------------------------------------------------  RADIOLOGY:  No results found.  ASSESSMENT AND PLAN:   Active Problems:   CHF (congestive heart failure) (HCC)   Hypoxia   Atrial fibrillation with RVR ([place])  Palliative care encounter  84 year old female with a history of persistent atrial fibrillation, chronic diastolic heart failure with preserved ejection fraction who presents with shortness of breath.    1. Acute hypoxic respiratory failure in the setting of acute CHF exacerbation.  On  nasal canula oxygen.   Due to worsening moved to stepdown unit, palliative care eval.   Slow improvement, cont nasal canula.    Improved now, may be able to discharge in a day or 2 most likely will need rehabilitation. May need oxygen on discharge.  Due to significant improvement I will likely send her to rehabilitation with palliative care nurse to follow over there.  2. Acute on chronic diastolic heart failure with preserved ejection fraction: Lasix 20 IV every 12 , but now worsening renal func, so changed to daily.  Continue to monitor intake and output with daily weight Continue metoprolol Patient is [facility] at [facility] cardiology. Cardiology consult. Monitor renal func. Gradual improvement. Back to baseline now.  3. Persistent atrial fibrillation with RVR: Continue Xarelto and metoprolol.  Started on cardizem IV drip, later also given one dose IV digoxine.  part of this is also due to infection.   Now intencivist changed to amiodarone iv drip.   Changed to oral amiodarone. Monitor on telemetry.  4. Elevated troponin: This may be due to demand ischemia. Continue to trend troponins Continue telemetry  5. Sepsis   Presented with worsening hypoxia, Tachycardia, elevated WBCs, bacteremia   Have H. INfluenza bacteremia- sensitivity is still pending.   IV rocephin, follow bl cx.   Repeat cultures are negative.  Changed to oral Augmentin.   6. Ac renal failure on CKD stage 3   Was worsening initially, now started improving.   Monitor with oral Lasix use now.   Stable now, Creatinin around 1.2   All the records are reviewed and case discussed with Care Management/Social Workerr. Management plans discussed with the patient, family and they are in agreement.  CODE STATUS: DNR  TOTAL TIME TAKING CARE OF THIS PATIENT: 35 minutes.  Correlated physical therapy evaluation for discharge.  POSSIBLE D/C IN 1 DAYS, DEPENDING ON CLINICAL CONDITION.   Vaughan Basta M.D on  01/06/2017   Between 7am to 6pm - Pager - [phone]  After 6pm go to [email or web address] - password [email or web address] [place] [facility]  Office  [phone]  CC: Primary care physician; Lynnell Jude, MD  Note: This dictation was prepared with Dragon dictation along with smaller phrase technology. Any transcriptional errors that result from this process are unintentional.

## 2017-01-06 NOTE — Progress Notes (Signed)
SUBJECTIVE: Patient is much more alert today   Vitals:   01/05/17 1646 01/05/17 1921 01/06/17 0457 01/06/17 0801  BP: 131/79 (!) 141/71 136/66 (!) 132/93  Pulse: (!) 109 83 92 88  Resp: 18 18 20 18   Temp: 97.6 F (36.4 C) 97.7 F (36.5 C) 98.4 F (36.9 C) 98 F (36.7 C)  TempSrc: Oral Oral Oral Oral  SpO2: 94% 93% 96% 94%  Weight:      Height:        Intake/Output Summary (Last 24 hours) at 01/06/2017 1142 Last data filed at 01/06/2017 1015 Gross per 24 hour  Intake 240 ml  Output 1050 ml  Net -810 ml    LABS: Basic Metabolic Panel: Recent Labs    01/05/17 0428 01/06/17 0401  NA 138 140  K 4.0 3.7  CL 99* 100*  CO2 32 33*  GLUCOSE 117* 106*  BUN 33* 31*  CREATININE 1.24* 1.20*  CALCIUM 8.2* 8.3*   Liver Function Tests: No results for input(s): AST, ALT, ALKPHOS, BILITOT, PROT, ALBUMIN in the last 72 hours. No results for input(s): LIPASE, AMYLASE in the last 72 hours. CBC: Recent Labs    01/05/17 0428 01/06/17 0401  WBC 18.9* 13.0*  HGB 11.1* 10.9*  HCT 35.4 34.9*  MCV 82.6 81.8  PLT 279 284   Cardiac Enzymes: No results for input(s): CKTOTAL, CKMB, CKMBINDEX, TROPONINI in the last 72 hours. BNP: Invalid input(s): POCBNP D-Dimer: No results for input(s): DDIMER in the last 72 hours. Hemoglobin A1C: No results for input(s): HGBA1C in the last 72 hours. Fasting Lipid Panel: No results for input(s): CHOL, HDL, LDLCALC, TRIG, CHOLHDL, LDLDIRECT in the last 72 hours. Thyroid Function Tests: No results for input(s): TSH, T4TOTAL, T3FREE, THYROIDAB in the last 72 hours.  Invalid input(s): FREET3 Anemia Panel: No results for input(s): VITAMINB12, FOLATE, FERRITIN, TIBC, IRON, RETICCTPCT in the last 72 hours.   PHYSICAL EXAM General: Well developed, well nourished, in no acute distress HEENT:  Normocephalic and atramatic Neck:  No JVD.  Lungs: Clear bilaterally to auscultation and percussion. Heart: HRRR . Normal S1 and S2 without gallops or murmurs.   Abdomen: Bowel sounds are positive, abdomen soft and non-tender  Msk:  Back normal, normal gait. Normal strength and tone for age. Extremities: No clubbing, cyanosis or edema.   Neuro: Alert and oriented X 3. Psych:  Good affect, responds appropriately  TELEMETRY: A. fib with controlled rate  ASSESSMENT AND PLAN: Atrial fibrillation with controlled ventricular rate now about 80/m. Feeling much better.  Active Problems:   CHF (congestive heart failure) (HCC)   Hypoxia   Atrial fibrillation with RVR [facility])   Palliative care encounter    Dionisio David, MD, [facility] 01/06/2017 11:42 AM

## 2017-01-06 NOTE — Progress Notes (Signed)
Per family patient seeming more confused than prior visit the day before. Patient was confused alert to self at beginning of shift but able to interact with family appropriately. Remained confused for remainder of shift and  No further changes noted. Will monitor.

## 2017-01-06 NOTE — Plan of Care (Signed)
  Progressing Education: Knowledge of General Education information will improve 01/06/2017 1420 - Progressing by Rolley Sims, RN Health Behavior/Discharge Planning: Ability to manage health-related needs will improve 01/06/2017 1420 - Progressing by Rolley Sims, RN Clinical Measurements: Ability to maintain clinical measurements within normal limits will improve 01/06/2017 1420 - Progressing by Rolley Sims, RN Will remain free from infection 01/06/2017 1420 - Progressing by Rolley Sims, RN

## 2017-01-06 NOTE — Clinical Social Work Note (Addendum)
CSW spoke to patient's son Mercedes Rodriguez [phone], and he agreed to have CSW begin bed search for [facility].  CSW explained to patient's son, that patient would need to have insurance approval to go to SNF.  Patient's son is requesting either [facility] or [facility] home.  CSW is awaiting bed offers for patient to go to SNF.  CSW to continue to follow patient's progress throughout discharge planning.  Formal assessment to follow, patient's son agreeable to SNF placement for short term rehab.  Jones Broom. Norval Morton, MSW, [place]  01/06/2017 6:39 PM

## 2017-01-07 LAB — CULTURE, BLOOD (ROUTINE X 2)
CULTURE: NO GROWTH
CULTURE: NO GROWTH
Special Requests: ADEQUATE

## 2017-01-07 MED ORDER — GUAIFENESIN-DM 100-10 MG/5ML PO SYRP: 10.0000 mL | ORAL_SOLUTION | Freq: Four times a day (QID) | ORAL | 0 refills | Status: DC | PRN

## 2017-01-07 MED ORDER — ACETAMINOPHEN 325 MG PO TABS: 650.0000 mg | ORAL_TABLET | Freq: Four times a day (QID) | ORAL | Status: AC | PRN

## 2017-01-07 MED ORDER — HYDROCODONE-ACETAMINOPHEN 5-325 MG PO TABS: 1.0000 | ORAL_TABLET | ORAL | 0 refills | Status: DC | PRN

## 2017-01-07 MED ORDER — FUROSEMIDE 20 MG PO TABS: 20.0000 mg | ORAL_TABLET | Freq: Every day | ORAL | Status: DC

## 2017-01-07 MED ORDER — AMOXICILLIN-POT CLAVULANATE 875-125 MG PO TABS: 1.0000 | ORAL_TABLET | Freq: Two times a day (BID) | ORAL | 0 refills | Status: DC

## 2017-01-07 MED ORDER — ESCITALOPRAM OXALATE 5 MG PO TABS
5.0000 mg | ORAL_TABLET | Freq: Every day | ORAL | Status: DC
  Filled 2017-01-07: qty 1

## 2017-01-07 MED ORDER — SENNOSIDES-DOCUSATE SODIUM 8.6-50 MG PO TABS: 1.0000 | ORAL_TABLET | Freq: Every evening | ORAL | Status: DC | PRN

## 2017-01-07 MED ORDER — AMIODARONE HCL 400 MG PO TABS: 400.0000 mg | ORAL_TABLET | Freq: Two times a day (BID) | ORAL | Status: DC

## 2017-01-07 MED ORDER — ALBUTEROL SULFATE (2.5 MG/3ML) 0.083% IN NEBU: 2.5000 mg | INHALATION_SOLUTION | Freq: Four times a day (QID) | RESPIRATORY_TRACT | 12 refills | Status: DC | PRN

## 2017-01-07 NOTE — Clinical Social Work Note (Signed)
Clinical Social Work Assessment  Patient Details  Name: Mercedes Rodriguez MRN: [id number] Date of Birth: 1930/01/07  Date of referral:  01/06/17               Reason for consult:  Facility Placement                Permission sought to share information with:  Family Supports, Customer service manager Permission granted to share information::  Yes, Verbal Permission Granted  Name::     Mercedes Rodriguez   [phone] or Mercedes Rodriguez Son [phone]  [phone]   Agency::  SNF admissions  Relationship::     Contact Information:     Housing/Transportation Living arrangements for the past 2 months:  Single Family Home Source of Information:  Adult Children, Other (Comment Required) Patient Interpreter Needed:  None Criminal Activity/Legal Involvement Pertinent to Current Situation/Hospitalization:    Significant Relationships:  Adult Children Lives with:  Adult Children Do you feel safe going back to the place where you live?  No Need for family participation in patient care:  Yes (Comment)  Care giving concerns:  Patient and family feels she needs short term rehab before she is able to return back home.   Social Worker assessment / plan: Patient is an 84 year old female who is alert and oriented x2.  Patient lives with her son and granddaughter, patient has some dementia, assessment completed by speaking with patient's granddaughter and son.  Patient's family was explained how insurance will pay for stay at SNF and what to expect at rehab.  CSW also explained role of CSW and process for looking for placements.  Patient's family did not express any other questions or concerns and gave CSW permission to begin bed search in [place]. Employment status:  Retired Nurse, adult PT Recommendations:  [place] / Referral to community resources:  [place]  Patient/Family's Response to care:   Patient and family are agreeable to going to SNF for short term rehab.  Patient/Family's Understanding of and Emotional Response to Diagnosis, Current Treatment, and Prognosis:  Patient and family are hopeful that she will not have to be in rehab for very long.  Emotional Assessment Appearance:  Appears stated age Attitude/Demeanor/Rapport:    Affect (typically observed):  Calm Orientation:  Oriented to Self, Oriented to Place Alcohol / Substance use:  Not Applicable Psych involvement (Current and /or in the community):  No (Comment)  Discharge Needs  Concerns to be addressed:  Lack of Support Readmission within the last 30 days:    Current discharge risk:  Lack of support system, Cognitively Impaired Barriers to Discharge:  Insurance Authorization   Anell Barr 01/07/2017, 4:46 PM

## 2017-01-07 NOTE — Clinical Social Work Note (Addendum)
CSW spoke to patient's son Mercedes Rodriguez to discuss bed offers for patient, he requested that CSW speak to his daughter Mercedes Rodriguez, [phone].  CSW contacted daughter and she would like patient to go to [facility].  CSW contacted [facility] and they can accept patient once insurance has been approved.  CSW faxed required information to insurance company awaiting insurance authorization.  CSW to continue to follow patient's progress throughout discharge planning.  3:00pm  CSW contacted insurance company to get status on authorization, they told me to try calling back in an hour.  CSW updated [facility].  4:15pm  CSW contacted insurance company again, to get status update, and they said it is still pending, CSW updated SNF and patient's granddaughter Mercedes Rodriguez.  5:30pm  CSW has not received approval for patient to go SNF yet, CSW updated physician, SNF, and also left message on patient's granddaughter's voice mail.  Jones Broom. Laritza Vokes, MSW, [place]  01/07/2017 11:55 AM

## 2017-01-07 NOTE — Progress Notes (Signed)
Physical Therapy Treatment Patient Details Name: Mercedes Rodriguez MRN: [id number] DOB: October 05, 1929 Today's Date: 01/07/2017    History of Present Illness Pt admitted for CHF. History includes Afib and HTN. Hospital stay complicated due to admission to CCU for respiratory distress on 12/27. Pt now on telemetry with HR WNL; still complaints of SOB.    PT Comments    Pt agreeable to PT; denies pain. Pt demonstrates difficulty with bed mobility for rolling and to/from edge of bed sitting requiring Min, but primarily Mod A and as well as tactile placement of hands for sequencing movement. Pt demonstrates limited active range of motion throughout Bilateral lower extremities with limited exercises. Pt declines out of bed at this time and only tolerated minimal exercise before requesting rest/no further PT at this time. Continue to recommend discharge to skilled nursing facility to progress strength and endurance to improve all functional mobility with decreased assist.    Follow Up Recommendations  SNF     Equipment Recommendations  None recommended by PT    Recommendations for Other Services       Precautions / Restrictions Precautions Precautions: Fall Restrictions Weight Bearing Restrictions: No    Mobility  Bed Mobility Overal bed mobility: Needs Assistance Bed Mobility: Rolling;Supine to Sit;Sit to Supine Rolling: Mod assist;Min assist   Supine to sit: Mod assist Sit to supine: Mod assist   General bed mobility comments: Heavy cues and tactile assist for rail reaching to roll. Total assist to reposition upward in bed. Assit for LEs and trunk to/from edge of bed  Transfers                 General transfer comment: Refused out of bed  Ambulation/Gait                 Stairs            Wheelchair Mobility    Modified Rankin (Stroke Patients Only)       Balance Overall balance assessment: Needs assistance Sitting-balance support: Bilateral upper  extremity supported;Feet supported Sitting balance-Leahy Scale: Fair                                      Cognition Arousal/Alertness: Awake/alert Behavior During Therapy: WFL for tasks assessed/performed Overall Cognitive Status: Within Functional Limits for tasks assessed                                        Exercises General Exercises - Lower Extremity Ankle Circles/Pumps: AROM;Both;10 reps;Supine Quad Sets: Strengthening;Both;Supine;5 reps Gluteal Sets: Strengthening;Both;Supine;5 reps Heel Slides: AROM;Both;Supine;5 reps(limited range) Hip ABduction/ADduction: AAROM;Both;Supine;5 reps    General Comments        Pertinent Vitals/Pain Pain Assessment: No/denies pain    Home Living                      Prior Function            PT Goals (current goals can now be found in the care plan section) Progress towards PT goals: Not progressing toward goals - comment    Frequency    Min 2X/week      PT Plan Current plan remains appropriate    Co-evaluation              AM-PAC PT "6 Clicks" Daily  Activity  Outcome Measure  Difficulty turning over in bed (including adjusting bedclothes, sheets and blankets)?: Unable Difficulty moving from lying on back to sitting on the side of the bed? : Unable Difficulty sitting down on and standing up from a chair with arms (e.g., wheelchair, bedside commode, etc,.)?: Unable Help needed moving to and from a bed to chair (including a wheelchair)?: Total Help needed walking in hospital room?: Total Help needed climbing 3-5 steps with a railing? : Total 6 Click Score: 6    End of Session Equipment Utilized During Treatment: Oxygen Activity Tolerance: Patient limited by fatigue Patient left: in bed;with call bell/phone within reach;with bed alarm set;with family/visitor present   PT Visit Diagnosis: Unsteadiness on feet (R26.81);Muscle weakness (generalized) (M62.81);Difficulty in  walking, not elsewhere classified (R26.2)     Time: 1683-7290 PT Time Calculation (min) (ACUTE ONLY): 20 min  Charges:                       G CodesLarae Grooms, PTA 01/07/2017, 1:22 PM

## 2017-01-07 NOTE — Discharge Instructions (Signed)
Heart Failure Clinic appointment on January 13 2017 at 11:00am with Darylene Price, [place]. Please call [phone] to reschedule.   Follow-up with [facility] cardiology in 1 week Follow-up with primary care physician at the facility in 5-7 days Continue palliative care follow-up in the facility in 1 week

## 2017-01-07 NOTE — Progress Notes (Signed)
Daily Progress Note   Patient Name: Mercedes Rodriguez       Date: 01/07/2017 DOB: Oct 21, 1929  Age: 84 y.o. MRN#: [id number] Attending Physician: [name], MD Primary Care Physician: Lynnell Jude, MD Admit Date: 12/31/2016  Reason for Consultation/Follow-up: Establishing goals of care, Non pain symptom management and Psychosocial/spiritual support  Subjective: RN Director stopped and spoke with me about patient.   I visited patient.  Her son was at bedside.  The patient was quiet but answered my questions.  We discussed starting an anti-depressant.  She stated she had never be on anti-depressants before.  We discussed the 2-6 weeks of time necessary before it will have an effect.    I talked with her son about surrogate health care power of attorney.  He confirmed that it is Benjamine Mola (patient's grand daughter).  I attempted to call [name] and left a message for her about the anti-depressant and that I wanted to talk with her about what to expect in the coming weeks.   Assessment: Patient recovering from bacteremia.  She still has advanced diastolic heart failure with some increased work of breathing and coarse breath sounds.  She does not appear to be eating much. Patient is very deconditioned.  I am concerned that she will decline again very quickly after d/c.   Patient Profile/HPI:  84 y.o. female  with past medical history of atrial fibrillation and diastolic heart failure who was admitted on 12/31/2016 with acute respiratory failure secondary to acute on chronic heart failure exacerbation. Microbiology is positive for H. Influenza bacteremia. The patient has not done well with treatment. On 12/28 she was requiring bipap for respiratory support, pulse rate was rapid and blood pressure  was soft.  There was significant concern that the patient would die during this hospitalization.     Length of Stay: 7  Current Medications: Scheduled Meds:  . amiodarone  400 mg Oral BID  . amoxicillin-clavulanate  1 tablet Oral Q12H  . chlorhexidine  15 mL Mouth Rinse BID  . escitalopram  5 mg Oral Daily  . furosemide  20 mg Oral Daily  . mouth rinse  15 mL Mouth Rinse q12n4p  . metoprolol succinate  100 mg Oral Daily  . rivaroxaban  15 mg Oral Q supper  . sodium chloride flush  3  mL Intravenous Q12H    Continuous Infusions: . sodium chloride      PRN Meds: sodium chloride, acetaminophen **OR** acetaminophen, albuterol, guaiFENesin-dextromethorphan, HYDROcodone-acetaminophen, metoprolol tartrate, morphine injection, [DISCONTINUED] ondansetron **OR** ondansetron (ZOFRAN) IV, senna-docusate, sodium chloride flush  Physical Exam        Elderly frail, awake, appropriate, able to give short answers to questions. CV rrr resp coarse breath sounds bilaterally, mildly increased work of breathing Abdomen soft, nt, nd  Vital Signs: BP 118/77   Pulse 78   Temp 97.8 F (36.6 C) (Oral)   Resp 18   Ht 5\' 4"  (1.626 m)   Wt 76.5 kg (168 lb 10.4 oz)   SpO2 97%   BMI 28.95 kg/m  SpO2: SpO2: 97 % O2 Device: O2 Device: Nasal Cannula O2 Flow Rate: O2 Flow Rate (L/min): 2 L/min  Intake/output summary:   Intake/Output Summary (Last 24 hours) at 01/07/2017 1304 Last data filed at 01/07/2017 1032 Gross per 24 hour  Intake 3 ml  Output 450 ml  Net -447 ml   LBM: Last BM Date: (pt stated does not remember) Baseline Weight: Weight: 68.9 kg (152 lb) Most recent weight: Weight: 76.5 kg (168 lb 10.4 oz)       Palliative Assessment/Data:  30%    Flowsheet Rows     Most Recent Value  Intake Tab  Referral Department  Hospitalist  Unit at Time of Referral  Med/Surg Unit  Palliative Care Primary Diagnosis  Cardiac  Date Notified  01/01/17  Palliative Care Type  New Palliative care    Reason for referral  Clarify Goals of Care  Date of Admission  12/31/16  Date first seen by Palliative Care  01/01/17  # of days Palliative referral response time  0 Day(s)  # of days IP prior to Palliative referral  1  Clinical Assessment  Psychosocial & Spiritual Assessment  Palliative Care Outcomes      Patient Active Problem List   Diagnosis Date Noted  . Hypoxia   . Atrial fibrillation with RVR ([place])   . Palliative care encounter   . CHF (congestive heart failure) ([place]) 12/31/2016    Palliative Care Plan    Recommendations/Plan:  DC to SNF with Palliative.  If patient is unable to rehab I strongly recommend Hospice.  Considered starting Lexapro - but will defer to the outpatient physician who will be able to follow her liver and kidney function on new medications.  Goals of Care and Additional Recommendations:  Limitations on Scope of Treatment: Full Scope Treatment  Code Status:  DNR  Prognosis:   < 6 months given advanced diastolic heart failure, atrial fibrillation and severe deconditioning.   Discharge Planning:  [place] for rehab with Palliative care service follow-up  Care plan was discussed with patient, son, Soil scientist  Thank you for allowing the Palliative Medicine Team to assist in the care of this patient.  Total time spent:  35 min.     Greater than 50%  of this time was spent counseling and coordinating care related to the above assessment and plan.  Florentina Jenny, PA-C Palliative Medicine  Please contact Palliative MedicineTeam phone at [phone] for questions and concerns between 7 am - 7 pm.   Please see AMION for individual provider pager numbers.

## 2017-01-07 NOTE — Discharge Summary (Signed)
[facility] NAME: Mercedes Rodriguez    MR#:  [id number]  DATE OF BIRTH:  1929-07-22  DATE OF ADMISSION:  12/31/2016 ADMITTING PHYSICIAN: Bettey Costa, MD  DATE OF DISCHARGE:  01/07/17  PRIMARY CARE PHYSICIAN: Lynnell Jude, MD    ADMISSION DIAGNOSIS:  Hypoxia [R09.02] Acute congestive heart failure, unspecified heart failure type ([place]) [I50.9]  DISCHARGE DIAGNOSIS:  Active Problems:   CHF (congestive heart failure) (HCC)   Hypoxia   Atrial fibrillation with RVR ([place])   Palliative care encounter   SECONDARY DIAGNOSIS:   Past Medical History:  Diagnosis Date  . Atrial fibrillation ([place])   . Hypertension     HOSPITAL COURSE:   HPI  Mercedes Rodriguez  is a 84 y.o. female with a known history of persistent atrial fibrillation, diastolic heart failure with pager ejection fraction and essential hypertension who presents with above complaint. Over the past 2-3 days patient has been complaining of increasing shortness of breath, dyspnea on exertion and PND. She is also reporting a cough however no fevers. She denies lower extremity edema or chest pain. Her family friend called EMS today due to patient complaining of shortness of breath. She was found to have oxygen saturations of 82% on room air. She does not wear oxygen at home. She gets her cardiology follow-up at [facility].   in the emergency room she has received IV Lasix. Chest x-ray is consistent with CHF exacerbation.  1. Acute hypoxic respiratory failure in the setting of acute CHF exacerbation.  Initially patient was on oxygen via nasal cannula but due to worsening of the respiratory function she was moved to stepdown unit and a palliative care was consulted.  Eventually with IV Lasix patient was improved clinically  Patient is still requiring 2 L of oxygen via nasal cannula and qualified for it.  Patient is seen and evaluated by palliative care  2. Acute on chronic diastolic heart  failure with preserved ejection fraction: Lasix 20 IV every 12 , but now worsening renal func, so changed to PO daily.  Continue to monitor intake and output with daily weight Continue metoprolol Patient is followed at [facility] cardiology. Cardiology consulted Back to baseline now.  3. Persistent atrial fibrillation with RVR: ContinueXarelto andmetoprolol.  Started on cardizem IV drip, later also given one dose IV digoxine.    Clinically no improvement intensivist changed to amiodarone iv drip.  Heart rate well controlled   Changed to oral amiodarone.  4. Elevated troponin: This may be due to demand ischemia. Continue to trend troponins Continue telemetry  5. Sepsis   Presented with worsening hypoxia, Tachycardia, elevated WBCs, bacteremia   Have H. INfluenza bacteremia-on 12/26 blood cultures cultures sensitivity is still pending.  Treated with IV rocephin, changed to p.o. Augmentin   Repeat blood cultures from 01/02/2017 are negative for 5 days      6. Ac renal failure on CKD stage 3   Was worsening initially, now started improving.   Monitor with oral Lasix use now.   Stable now, Creatinin around 1.2  7.  Grief reaction Patient's sister passed away on 01/13/2023.  Patient denies any depression but feeling sorry for her sister has a strong family support.  We will continue close monitoring of the patient.  If no improvement in the next week or so patient will be benefited  with antidepressants like Lexapro or Celexa Patient is seen and evaluated by palliative care who is recommending to consider  hospice if no improvement in the next 1-2 weeks  Patient needs palliative care follow-up at the nursing home  DISCHARGE CONDITIONS:   fair  CONSULTS OBTAINED:  Treatment Team:  Dionisio David, MD   PROCEDURES  None   DRUG ALLERGIES:  No Known Allergies  DISCHARGE MEDICATIONS:   Allergies as of 01/07/2017   No Known Allergies     Medication List    STOP taking these  medications   levofloxacin 500 MG tablet Commonly known as:  LEVAQUIN     TAKE these medications   acetaminophen 325 MG tablet Commonly known as:  TYLENOL Take 2 tablets (650 mg total) by mouth every 6 (six) hours as needed for mild pain (or Fever >/= 101).   albuterol (2.5 MG/3ML) 0.083% nebulizer solution Commonly known as:  PROVENTIL Take 3 mLs (2.5 mg total) by nebulization every 6 (six) hours as needed for wheezing or shortness of breath.   amiodarone 400 MG tablet Commonly known as:  PACERONE Take 1 tablet (400 mg total) by mouth 2 (two) times daily.   amoxicillin-clavulanate 875-125 MG tablet Commonly known as:  AUGMENTIN Take 1 tablet by mouth every 12 (twelve) hours.   dofetilide 250 MCG capsule Commonly known as:  TIKOSYN Take 250 mcg by mouth 2 (two) times daily.   furosemide 20 MG tablet Commonly known as:  LASIX Take 1 tablet (20 mg total) by mouth daily. Start taking on:  01/08/2017   guaiFENesin-dextromethorphan 100-10 MG/5ML syrup Commonly known as:  ROBITUSSIN DM Take 10 mLs by mouth every 6 (six) hours as needed for cough.   HYDROcodone-acetaminophen 5-325 MG tablet Commonly known as:  NORCO/VICODIN Take 1-2 tablets by mouth every 4 (four) hours as needed for moderate pain.   metoprolol succinate 100 MG 24 hr tablet Commonly known as:  TOPROL-XL Take 100 mg by mouth daily.   rivaroxaban 20 MG Tabs tablet Commonly known as:  XARELTO Take 20 mg by mouth daily.   senna-docusate 8.6-50 MG tablet Commonly known as:  Senokot-S Take 1 tablet by mouth at bedtime as needed for mild constipation.        DISCHARGE INSTRUCTIONS:   Heart Failure Clinic appointment on January 13 2017 at 11:00am with Darylene Price, [place]. Please call [phone] to reschedule.   Follow-up with [facility] cardiology in 1 week Follow-up with primary care physician at the facility in 5-7 days Continue palliative care follow-up in the facility in 1 week   DIET:  Cardiac  diet  DISCHARGE CONDITION:  Stable  ACTIVITY:  Activity as tolerated  OXYGEN:  Home Oxygen: Yes.     Oxygen Delivery: 2 liters/min via Patient connected to nasal cannula oxygen  DISCHARGE LOCATION:  nursing home   If you experience worsening of your admission symptoms, develop shortness of breath, life threatening emergency, suicidal or homicidal thoughts you must seek medical attention immediately by calling 911 or calling your MD immediately  if symptoms less severe.  You Must read complete instructions/literature along with all the possible adverse reactions/side effects for all the Medicines you take and that have been prescribed to you. Take any new Medicines after you have completely understood and accpet all the possible adverse reactions/side effects.   Please note  You were cared for by a hospitalist during your hospital stay. If you have any questions about your discharge medications or the care you received while you were in the hospital after you are discharged, you can call the unit and asked to speak with the hospitalist  on call if the hospitalist that took care of you is not available. Once you are discharged, your primary care physician will handle any further medical issues. Please note that NO REFILLS for any discharge medications will be authorized once you are discharged, as it is imperative that you return to your primary care physician (or establish a relationship with a primary care physician if you do not have one) for your aftercare needs so that they can reassess your need for medications and monitor your lab values.     Today  Chief Complaint  Patient presents with  . Cough   Today patient is feeling sleepy as she could not fall asleep last night but otherwise feeling fine.  Son at bedside.  She reports that she is not depressed but recently on 01/04/23 her sister passed away and going through grieving.    ROS:  CONSTITUTIONAL: Denies fevers, chills.  Denies any fatigue, weakness.  EYES: Denies blurry vision, double vision, eye pain. EARS, NOSE, THROAT: Denies tinnitus, ear pain, hearing loss. RESPIRATORY: Denies cough, wheeze, shortness of breath.  CARDIOVASCULAR: Denies chest pain, palpitations, edema.  GASTROINTESTINAL: Denies nausea, vomiting, diarrhea, abdominal pain. Denies bright red blood per rectum. GENITOURINARY: Denies dysuria, hematuria. ENDOCRINE: Denies nocturia or thyroid problems. HEMATOLOGIC AND LYMPHATIC: Denies easy bruising or bleeding. SKIN: Denies rash or lesion. MUSCULOSKELETAL: Denies pain in neck, back, shoulder, knees, hips or arthritic symptoms.  NEUROLOGIC: Denies paralysis, paresthesias.  PSYCHIATRIC: Denies anxiety or depressive symptoms.   VITAL SIGNS:  Blood pressure 118/77, pulse 88, temperature 97.8 F (36.6 C), temperature source Oral, resp. rate 18, height 5\' 4"  (1.626 m), weight 76.5 kg (168 lb 10.4 oz), SpO2 90 %.  I/O:    Intake/Output Summary (Last 24 hours) at 01/07/2017 1416 Last data filed at 01/07/2017 1032 Gross per 24 hour  Intake 3 ml  Output 450 ml  Net -447 ml    PHYSICAL EXAMINATION:  GENERAL:  84 y.o.-year-old patient lying in the bed with no acute distress.  EYES: Pupils equal, round, reactive to light and accommodation. No scleral icterus. Extraocular muscles intact.  HEENT: Head atraumatic, normocephalic. Oropharynx and nasopharynx clear.  NECK:  Supple, no jugular venous distention. No thyroid enlargement, no tenderness.  LUNGS: Normal breath sounds bilaterally, no wheezing, rales,rhonchi or crepitation. No use of accessory muscles of respiration.  CARDIOVASCULAR: S1, S2 normal. No murmurs, rubs, or gallops.  ABDOMEN: Soft, non-tender, non-distended. Bowel sounds present. No organomegaly or mass.  EXTREMITIES: No pedal edema, cyanosis, or clubbing.  NEUROLOGIC: Cranial nerves II through XII are intact. Muscle strength 5/5 in all extremities. Sensation intact. Gait not  checked.  PSYCHIATRIC: The patient is alert and oriented x 3.  SKIN: No obvious rash, lesion, or ulcer.   DATA REVIEW:   CBC Recent Labs  Lab 01/06/17 0401  WBC 13.0*  HGB 10.9*  HCT 34.9*  PLT 284    Chemistries  Recent Labs  Lab 01/06/17 0401  NA 140  K 3.7  CL 100*  CO2 33*  GLUCOSE 106*  BUN 31*  CREATININE 1.20*  CALCIUM 8.3*    Cardiac Enzymes Recent Labs  Lab 01/01/17 0010  TROPONINI 0.06*    Microbiology Results  Results for orders placed or performed during the hospital encounter of 12/31/16  Blood culture (routine x 2)     Status: Abnormal (Preliminary result)   Collection Time: 12/31/16  9:15 AM  Result Value Ref Range Status   Specimen Description   Final    BLOOD  LEFT ANTECUBITAL Performed at [facility], [address], [place], [place] [address]    Special Requests   Final    BOTTLES DRAWN AEROBIC AND ANAEROBIC Blood Culture adequate volume Performed at [facility], [address] [place], [place] [address]    Culture  Setup Time   Final    GRAM NEGATIVE RODS IN BOTH AEROBIC AND ANAEROBIC BOTTLES CRITICAL RESULT CALLED TO, READ BACK BY AND VERIFIED WITH: SHEEMA HALLAJI ON 01/01/17 AT 33 QSD     Culture (A)  Final    HAEMOPHILUS INFLUENZAE BETA LACTAMASE NEGATIVE HEALTH DEPARTMENT NOTIFIED Performed at [facility], [place] [address] [place], [place] [address]    Report Status PENDING  Incomplete  Blood Culture ID Panel (Reflexed)     Status: Abnormal   Collection Time: 12/31/16  9:15 AM  Result Value Ref Range Status   Enterococcus species NOT DETECTED NOT DETECTED Final   Listeria monocytogenes NOT DETECTED NOT DETECTED Final   Staphylococcus species NOT DETECTED NOT DETECTED Final   Staphylococcus aureus NOT DETECTED NOT DETECTED Final   Streptococcus species NOT DETECTED NOT DETECTED Final   Streptococcus agalactiae NOT DETECTED NOT DETECTED Final   Streptococcus pneumoniae NOT DETECTED NOT DETECTED  Final   Streptococcus pyogenes NOT DETECTED NOT DETECTED Final   Acinetobacter baumannii NOT DETECTED NOT DETECTED Final   Enterobacteriaceae species NOT DETECTED NOT DETECTED Final   Enterobacter cloacae complex NOT DETECTED NOT DETECTED Final   Escherichia coli NOT DETECTED NOT DETECTED Final   Klebsiella oxytoca NOT DETECTED NOT DETECTED Final   Klebsiella pneumoniae NOT DETECTED NOT DETECTED Final   Proteus species NOT DETECTED NOT DETECTED Final   Serratia marcescens NOT DETECTED NOT DETECTED Final   Haemophilus influenzae DETECTED (A) NOT DETECTED Final    Comment: CRITICAL RESULT CALLED TO, READ BACK BY AND VERIFIED WITH: SHEEMA HALLAJI ON 01/01/17 AT 1153 QSD    Neisseria meningitidis NOT DETECTED NOT DETECTED Final   Pseudomonas aeruginosa NOT DETECTED NOT DETECTED Final   Candida albicans NOT DETECTED NOT DETECTED Final   Candida glabrata NOT DETECTED NOT DETECTED Final   Candida krusei NOT DETECTED NOT DETECTED Final   Candida parapsilosis NOT DETECTED NOT DETECTED Final   Candida tropicalis NOT DETECTED NOT DETECTED Final    Comment: Performed at [facility], [address] [place], [place] [address]  Blood culture (routine x 2)     Status: None   Collection Time: 12/31/16 10:00 AM  Result Value Ref Range Status   Specimen Description BLOOD RIGHT ANTECUBITAL  Final   Special Requests   Final    BOTTLES DRAWN AEROBIC AND ANAEROBIC Blood Culture results may not be optimal due to an excessive volume of blood received in culture bottles   Culture   Final    NO GROWTH 5 DAYS Performed at [facility], [address] [place],  [address]    Report Status 01/05/2017 FINAL  Final  MRSA PCR Screening     Status: None   Collection Time: 01/01/17  8:59 PM  Result Value Ref Range Status   MRSA by PCR NEGATIVE NEGATIVE Final    Comment:        The GeneXpert MRSA Assay (FDA approved for NASAL specimens only), is one component of a comprehensive MRSA  colonization surveillance program. It is not intended to diagnose MRSA infection nor to guide or monitor treatment for MRSA infections. Performed at [facility], [place], [place]  [address]   CULTURE, BLOOD (ROUTINE X 2) w Reflex to ID Panel     Status: None   Collection Time: 01/02/17  5:30 AM  Result Value Ref Range Status   Specimen Description BLOOD RIGHT HAND  Final   Special Requests   Final    BOTTLES DRAWN AEROBIC AND ANAEROBIC Blood Culture adequate volume   Culture   Final    NO GROWTH 5 DAYS Performed at [facility], [address] [place], [place] [address]    Report Status 01/07/2017 FINAL  Final  CULTURE, BLOOD (ROUTINE X 2) w Reflex to ID Panel     Status: None   Collection Time: 01/02/17  5:30 AM  Result Value Ref Range Status   Specimen Description BLOOD LEFT HAND  Final   Special Requests   Final    BOTTLES DRAWN AEROBIC AND ANAEROBIC Blood Culture results may not be optimal due to an inadequate volume of blood received in culture bottles   Culture   Final    NO GROWTH 5 DAYS Performed at [facility], [address], [place], [place] [address]    Report Status 01/07/2017 FINAL  Final    RADIOLOGY:  No results found.  EKG:   Orders placed or performed during the hospital encounter of 12/31/16  . ED EKG  . ED EKG  . EKG 12-Lead  . EKG 12-Lead      Management plans discussed with the patient, son and granddaughter at bedside and they are in agreement.  CODE STATUS:     Code Status Orders  (From admission, onward)        Start     Ordered   12/31/16 1206  Do not attempt resuscitation (DNR)  Continuous    Question Answer Comment  In the event of cardiac or respiratory ARREST Do not call a "code blue"   In the event of cardiac or respiratory ARREST Do not perform Intubation, CPR, defibrillation or ACLS   In the event of cardiac or respiratory ARREST Use medication by any route, position, wound  care, and other measures to relive pain and suffering. May use oxygen, suction and manual treatment of airway obstruction as needed for comfort.      12/31/16 1205    Code Status History    Date Active Date Inactive Code Status Order ID Comments User Context   This patient has a current code status but no historical code status.    Advance Directive Documentation     Most Recent Value  Type of Advance Directive  Living will, Healthcare Power of Attorney  Pre-existing out of facility DNR order (yellow form or pink MOST form)  No data  "MOST" Form in Place?  No data      TOTAL TIME TAKING CARE OF THIS PATIENT: 45 minutes.   Note: This dictation was prepared with Dragon dictation along with smaller phrase technology. Any transcriptional errors that result from this process are unintentional.   @MEC @  on 01/07/2017 at 2:16 PM  Between 7am to 6pm - Pager - [phone]  After 6pm go to [email or web address] - password [email or web address] [facility]  [place] [facility]  Office  [phone]  CC: Primary care physician; Lynnell Jude, MD

## 2017-01-07 NOTE — Progress Notes (Signed)
SUBJECTIVE: Patient denies chest pain or shortness of breath   Vitals:   01/07/17 0427 01/07/17 0728 01/07/17 1036 01/07/17 1040  BP: 140/82 (!) 111/52 118/77 118/77  Pulse: 60 (!) 38 78   Resp: 18     Temp: 97.8 F (36.6 C)     TempSrc: Oral     SpO2: 97% 97%    Weight:      Height:        Intake/Output Summary (Last 24 hours) at 01/07/2017 1103 Last data filed at 01/07/2017 1032 Gross per 24 hour  Intake 3 ml  Output 450 ml  Net -447 ml    LABS: Basic Metabolic Panel: Recent Labs    01/05/17 0428 01/06/17 0401  NA 138 140  K 4.0 3.7  CL 99* 100*  CO2 32 33*  GLUCOSE 117* 106*  BUN 33* 31*  CREATININE 1.24* 1.20*  CALCIUM 8.2* 8.3*   Liver Function Tests: No results for input(s): AST, ALT, ALKPHOS, BILITOT, PROT, ALBUMIN in the last 72 hours. No results for input(s): LIPASE, AMYLASE in the last 72 hours. CBC: Recent Labs    01/05/17 0428 01/06/17 0401  WBC 18.9* 13.0*  HGB 11.1* 10.9*  HCT 35.4 34.9*  MCV 82.6 81.8  PLT 279 284   Cardiac Enzymes: No results for input(s): CKTOTAL, CKMB, CKMBINDEX, TROPONINI in the last 72 hours. BNP: Invalid input(s): POCBNP D-Dimer: No results for input(s): DDIMER in the last 72 hours. Hemoglobin A1C: No results for input(s): HGBA1C in the last 72 hours. Fasting Lipid Panel: No results for input(s): CHOL, HDL, LDLCALC, TRIG, CHOLHDL, LDLDIRECT in the last 72 hours. Thyroid Function Tests: No results for input(s): TSH, T4TOTAL, T3FREE, THYROIDAB in the last 72 hours.  Invalid input(s): FREET3 Anemia Panel: No results for input(s): VITAMINB12, FOLATE, FERRITIN, TIBC, IRON, RETICCTPCT in the last 72 hours.   PHYSICAL EXAM General: Well developed, well nourished, in no acute distress HEENT:  Normocephalic and atramatic Neck:  No JVD.  Lungs: Clear bilaterally to auscultation and percussion. Heart: HRRR . Normal S1 and S2 without gallops or murmurs.  Abdomen: Bowel sounds are positive, abdomen soft and non-tender   Msk:  Back normal, normal gait. Normal strength and tone for age. Extremities: No clubbing, cyanosis or edema.   Neuro: Alert and oriented X 3. Psych:  Good affect, responds appropriately  TELEMETRY: Atrial fibrillation with controlled ventricular rate approximately 80 bpm.  ASSESSMENT AND PLAN: Congestive heart failure and atrial fibrillation. Ventricular rate is well controlled now and can be discharged with follow-up [facility] cardiology.  Active Problems:   CHF (congestive heart failure) (HCC)   Hypoxia   Atrial fibrillation with RVR [facility])   Palliative care encounter    Mercedes David, MD, [facility] 01/07/2017 11:03 AM

## 2017-01-08 NOTE — Clinical Social Work Placement (Signed)
   CLINICAL SOCIAL WORK PLACEMENT  NOTE  Date:  01/08/2017  Patient Details  Name: Mercedes Rodriguez MRN: [id number] Date of Birth: Oct 21, 1929  Clinical Social Work is seeking post-discharge placement for this patient at the [place] level of care (*CSW will initial, date and re-position this form in  chart as items are completed):  Yes   Patient/family provided with [facility] Work Department's list of facilities offering this level of care within the geographic area requested by the patient (or if unable, by the patient's family).  Yes   Patient/family informed of their freedom to choose among providers that offer the needed level of care, that participate in Medicare, Medicaid or managed care program needed by the patient, have an available bed and are willing to accept the patient.  Yes   Patient/family informed of [facility]'s ownership interest in [facility] and [facility], as well as of the fact that they are under no obligation to receive care at these facilities.  PASRR submitted to EDS on 01/06/17     PASRR number received on 01/06/17     Existing PASRR number confirmed on       FL2 transmitted to all facilities in geographic area requested by pt/family on 01/06/17     FL2 transmitted to all facilities within larger geographic area on       Patient informed that his/her managed care company has contracts with or will negotiate with certain facilities, including the following:        Yes   Patient/family informed of bed offers received.  Patient chooses bed at [facility]     Physician recommends and patient chooses bed at      Patient to be transferred to [place] on 01/08/17.  Patient to be transferred to facility by [facility]     Patient family notified on 01/08/17 of transfer.  Name of family member notified:  Patient's granddaughter     PHYSICIAN Please sign FL2,  Please sign DNR     Additional Comment:    _______________________________________________ Ross Ludwig, LCSWA 01/08/2017, 11:34 AM

## 2017-01-08 NOTE — Clinical Social Work Note (Addendum)
Patient to be d/c'ed today to [place].  Patient and family agreeable to plans will transport via ems RN to call report to [phone].  CSW left message on patient's granddaughter's voice mail to inform her that patient will be able to discharge today.  Palliative to follow at SNF per palliative recommendation, CSW gave referral to [facility].  Evette Cristal, MSW, [place]

## 2017-01-08 NOTE — Clinical Social Work Note (Signed)
CSW received phone call from [place] that patient has been approved for SNF placement at [facility], reference number is [phone].  CSW updated SNF and also attempted to call patient's granddaughter [name], [phone], left message on her voice mail to inform her that patient will be discharging today.  Jones Broom. Melana Hingle, MSW, [place]  01/08/2017 10:31 AM

## 2017-01-08 NOTE — Discharge Summary (Signed)
[facility] NAME: Mercedes Rodriguez    MR#:  [id number]  DATE OF BIRTH:  08/24/1929  DATE OF ADMISSION:  12/31/2016 ADMITTING PHYSICIAN: Bettey Costa, MD  DATE OF DISCHARGE:  01/08/17  PRIMARY CARE PHYSICIAN: Lynnell Jude, MD    ADMISSION DIAGNOSIS:  Hypoxia [R09.02] Acute congestive heart failure, unspecified heart failure type ([place]) [I50.9]  DISCHARGE DIAGNOSIS:  Active Problems:   CHF (congestive heart failure) (HCC)   Hypoxia   Atrial fibrillation with RVR ([place] )   Palliative care encounter   SECONDARY DIAGNOSIS:   Past Medical History:  Diagnosis Date  . Atrial fibrillation ([place])   . Hypertension     HOSPITAL COURSE:   HPI  Mercedes Rodriguez  is a 84 y.o. female with a known history of persistent atrial fibrillation, diastolic heart failure with pager ejection fraction and essential hypertension who presents with above complaint. Over the past 2-3 days patient has been complaining of increasing shortness of breath, dyspnea on exertion and PND. She is also reporting a cough however no fevers. She denies lower extremity edema or chest pain. Her family friend called EMS today due to patient complaining of shortness of breath. She was found to have oxygen saturations of 82% on room air. She does not wear oxygen at home. She gets her cardiology follow-up at [facility].   in the emergency room she has received IV Lasix. Chest x-ray is consistent with CHF exacerbation.  1. Acute hypoxic respiratory failure in the setting of acute CHF exacerbation.  Initially patient was on oxygen via nasal cannula but due to worsening of the respiratory function she was moved to stepdown unit and a palliative care was consulted.  Eventually with IV Lasix patient was improved clinically  Patient is still requiring 2 L of oxygen via nasal cannula and qualified for it.  Patient is seen and evaluated by palliative care  2. Acute on chronic diastolic heart  failure with preserved ejection fraction: Lasix 20 IV every 12 , but now worsening renal func, so changed to PO daily.  Continue to monitor intake and output with daily weight Continue metoprolol Patient is followed at [facility] cardiology. Cardiology consulted Back to baseline now.  3. Persistent atrial fibrillation with RVR: ContinueXarelto andmetoprolol.  Started on cardizem IV drip, later also given one dose IV digoxine.    Clinically no improvement intensivist changed to amiodarone iv drip.  Heart rate well controlled   Changed to oral amiodarone.  4. Elevated troponin: This may be due to demand ischemia. Continue to trend troponins Continue telemetry  5. Sepsis   Presented with worsening hypoxia, Tachycardia, elevated WBCs, bacteremia   Have H. INfluenza bacteremia-on 12/26 blood cultures from 1 bottle, sensitivity is still pending.  Treated with IV rocephin, changed to p.o. Augmentin   Repeat blood cultures from 01/02/2017 are negative for 5 days      6. Ac renal failure on CKD stage 3   Was worsening initially, now started improving.   Monitor with oral Lasix use now.   Stable now, Creatinin around 1.2  7.  Grief reaction Patient's sister passed away on 12-28-2022.  Patient denies any depression but feeling sorry for her sister has a strong family support.  We will continue close monitoring of the patient.  If no improvement in the next week or so patient will be benefited  with antidepressants like Lexapro or Celexa Patient is seen and evaluated by palliative care who is recommending  to consider hospice if no improvement in the next 1-2 weeks  Patient needs palliative care follow-up at the nursing home  DISCHARGE CONDITIONS:   fair  CONSULTS OBTAINED:  Treatment Team:  Dionisio David, MD   PROCEDURES  None   DRUG ALLERGIES:  No Known Allergies  DISCHARGE MEDICATIONS:   Allergies as of 01/08/2017   No Known Allergies     Medication List    STOP taking  these medications   levofloxacin 500 MG tablet Commonly known as:  LEVAQUIN     TAKE these medications   acetaminophen 325 MG tablet Commonly known as:  TYLENOL Take 2 tablets (650 mg total) by mouth every 6 (six) hours as needed for mild pain (or Fever >/= 101).   albuterol (2.5 MG/3ML) 0.083% nebulizer solution Commonly known as:  PROVENTIL Take 3 mLs (2.5 mg total) by nebulization every 6 (six) hours as needed for wheezing or shortness of breath.   amiodarone 400 MG tablet Commonly known as:  PACERONE Take 1 tablet (400 mg total) by mouth 2 (two) times daily.   amoxicillin-clavulanate 875-125 MG tablet Commonly known as:  AUGMENTIN Take 1 tablet by mouth every 12 (twelve) hours.   dofetilide 250 MCG capsule Commonly known as:  TIKOSYN Take 250 mcg by mouth 2 (two) times daily.   furosemide 20 MG tablet Commonly known as:  LASIX Take 1 tablet (20 mg total) by mouth daily.   guaiFENesin-dextromethorphan 100-10 MG/5ML syrup Commonly known as:  ROBITUSSIN DM Take 10 mLs by mouth every 6 (six) hours as needed for cough.   HYDROcodone-acetaminophen 5-325 MG tablet Commonly known as:  NORCO/VICODIN Take 1-2 tablets by mouth every 4 (four) hours as needed for moderate pain.   metoprolol succinate 100 MG 24 hr tablet Commonly known as:  TOPROL-XL Take 100 mg by mouth daily.   rivaroxaban 20 MG Tabs tablet Commonly known as:  XARELTO Take 20 mg by mouth daily.   senna-docusate 8.6-50 MG tablet Commonly known as:  Senokot-S Take 1 tablet by mouth at bedtime as needed for mild constipation.        DISCHARGE INSTRUCTIONS:   Heart Failure Clinic appointment on January 13 2017 at 11:00am with Darylene Price, [place]. Please call [phone] to reschedule.   Follow-up with [facility] cardiology in 1 week Follow-up with primary care physician at the facility in 5-7 days Continue palliative care follow-up in the facility in 1 week   DIET:  Cardiac diet  DISCHARGE CONDITION:   Stable  ACTIVITY:  Activity as tolerated  OXYGEN:  Home Oxygen: Yes.     Oxygen Delivery: 2 liters/min via Patient connected to nasal cannula oxygen  DISCHARGE LOCATION:  nursing home   If you experience worsening of your admission symptoms, develop shortness of breath, life threatening emergency, suicidal or homicidal thoughts you must seek medical attention immediately by calling 911 or calling your MD immediately  if symptoms less severe.  You Must read complete instructions/literature along with all the possible adverse reactions/side effects for all the Medicines you take and that have been prescribed to you. Take any new Medicines after you have completely understood and accpet all the possible adverse reactions/side effects.   Please note  You were cared for by a hospitalist during your hospital stay. If you have any questions about your discharge medications or the care you received while you were in the hospital after you are discharged, you can call the unit and asked to speak with the hospitalist on call if  the hospitalist that took care of you is not available. Once you are discharged, your primary care physician will handle any further medical issues. Please note that NO REFILLS for any discharge medications will be authorized once you are discharged, as it is imperative that you return to your primary care physician (or establish a relationship with a primary care physician if you do not have one) for your aftercare needs so that they can reassess your need for medications and monitor your lab values.     Today  Chief Complaint  Patient presents with  . Cough   Today patient is feeling sleepy as she could not fall asleep last night but otherwise feeling fine.  Son at bedside.  She reports that she is not depressed but recently on 2023/01/15 her sister passed away and going through grieving.    ROS:  CONSTITUTIONAL: Denies fevers, chills. Denies any fatigue, weakness.   EYES: Denies blurry vision, double vision, eye pain. EARS, NOSE, THROAT: Denies tinnitus, ear pain, hearing loss. RESPIRATORY: Denies cough, wheeze, shortness of breath.  CARDIOVASCULAR: Denies chest pain, palpitations, edema.  GASTROINTESTINAL: Denies nausea, vomiting, diarrhea, abdominal pain. Denies bright red blood per rectum. GENITOURINARY: Denies dysuria, hematuria. ENDOCRINE: Denies nocturia or thyroid problems. HEMATOLOGIC AND LYMPHATIC: Denies easy bruising or bleeding. SKIN: Denies rash or lesion. MUSCULOSKELETAL: Denies pain in neck, back, shoulder, knees, hips or arthritic symptoms.  NEUROLOGIC: Denies paralysis, paresthesias.  PSYCHIATRIC: Denies anxiety or depressive symptoms.   VITAL SIGNS:  Blood pressure 112/62, pulse 75, temperature 97.8 F (36.6 C), resp. rate 20, height 5\' 4"  (1.626 m), weight 73.5 kg (162 lb), SpO2 96 %.  I/O:    Intake/Output Summary (Last 24 hours) at 01/08/2017 1128 Last data filed at 01/08/2017 0952 Gross per 24 hour  Intake 120 ml  Output 0 ml  Net 120 ml    PHYSICAL EXAMINATION:  GENERAL:  84 y.o.-year-old patient lying in the bed with no acute distress.  EYES: Pupils equal, round, reactive to light and accommodation. No scleral icterus. Extraocular muscles intact.  HEENT: Head atraumatic, normocephalic. Oropharynx and nasopharynx clear.  NECK:  Supple, no jugular venous distention. No thyroid enlargement, no tenderness.  LUNGS: Normal breath sounds bilaterally, no wheezing, rales,rhonchi or crepitation. No use of accessory muscles of respiration.  CARDIOVASCULAR: S1, S2 normal. No murmurs, rubs, or gallops.  ABDOMEN: Soft, non-tender, non-distended. Bowel sounds present. No organomegaly or mass.  EXTREMITIES: No pedal edema, cyanosis, or clubbing.  NEUROLOGIC: Cranial nerves II through XII are intact. Muscle strength 5/5 in all extremities. Sensation intact. Gait not checked.  PSYCHIATRIC: The patient is alert and oriented x 3.  SKIN:  No obvious rash, lesion, or ulcer.   DATA REVIEW:   CBC Recent Labs  Lab 01/06/17 0401  WBC 13.0*  HGB 10.9*  HCT 34.9*  PLT 284    Chemistries  Recent Labs  Lab 01/06/17 0401  NA 140  K 3.7  CL 100*  CO2 33*  GLUCOSE 106*  BUN 31*  CREATININE 1.20*  CALCIUM 8.3*    Cardiac Enzymes No results for input(s): TROPONINI in the last 168 hours.  Microbiology Results  Results for orders placed or performed during the hospital encounter of 12/31/16  Blood culture (routine x 2)     Status: Abnormal (Preliminary result)   Collection Time: 12/31/16  9:15 AM  Result Value Ref Range Status   Specimen Description   Final    BLOOD LEFT ANTECUBITAL Performed at [facility], [address] [place], [place] [address]    Special Requests   Final    BOTTLES DRAWN AEROBIC AND ANAEROBIC Blood Culture adequate volume Performed at [facility], [address] [place], [place] [address]    Culture  Setup Time   Final    GRAM NEGATIVE RODS IN BOTH AEROBIC AND ANAEROBIC BOTTLES CRITICAL RESULT CALLED TO, READ BACK BY AND VERIFIED WITH: SHEEMA HALLAJI ON 01/01/17 AT 40 QSD     Culture (A)  Final    HAEMOPHILUS INFLUENZAE BETA LACTAMASE NEGATIVE HEALTH DEPARTMENT NOTIFIED Performed at [facility], [address] [place], [place] [address]    Report Status PENDING  Incomplete  Blood Culture ID Panel (Reflexed)     Status: Abnormal   Collection Time: 12/31/16  9:15 AM  Result Value Ref Range Status   Enterococcus species NOT DETECTED NOT DETECTED Final   Listeria monocytogenes NOT DETECTED NOT DETECTED Final   Staphylococcus species NOT DETECTED NOT DETECTED Final   Staphylococcus aureus NOT DETECTED NOT DETECTED Final   Streptococcus species NOT DETECTED NOT DETECTED Final   Streptococcus agalactiae NOT DETECTED NOT DETECTED Final   Streptococcus pneumoniae NOT DETECTED NOT DETECTED Final   Streptococcus pyogenes NOT DETECTED NOT DETECTED Final    Acinetobacter baumannii NOT DETECTED NOT DETECTED Final   Enterobacteriaceae species NOT DETECTED NOT DETECTED Final   Enterobacter cloacae complex NOT DETECTED NOT DETECTED Final   Escherichia coli NOT DETECTED NOT DETECTED Final   Klebsiella oxytoca NOT DETECTED NOT DETECTED Final   Klebsiella pneumoniae NOT DETECTED NOT DETECTED Final   Proteus species NOT DETECTED NOT DETECTED Final   Serratia marcescens NOT DETECTED NOT DETECTED Final   Haemophilus influenzae DETECTED (A) NOT DETECTED Final    Comment: CRITICAL RESULT CALLED TO, READ BACK BY AND VERIFIED WITH: SHEEMA HALLAJI ON 01/01/17 AT 1153 QSD    Neisseria meningitidis NOT DETECTED NOT DETECTED Final   Pseudomonas aeruginosa NOT DETECTED NOT DETECTED Final   Candida albicans NOT DETECTED NOT DETECTED Final   Candida glabrata NOT DETECTED NOT DETECTED Final   Candida krusei NOT DETECTED NOT DETECTED Final   Candida parapsilosis NOT DETECTED NOT DETECTED Final   Candida tropicalis NOT DETECTED NOT DETECTED Final    Comment: Performed at [facility], [address] [place], [place] [address]  Blood culture (routine x 2)     Status: None   Collection Time: 12/31/16 10:00 AM  Result Value Ref Range Status   Specimen Description BLOOD RIGHT ANTECUBITAL  Final   Special Requests   Final    BOTTLES DRAWN AEROBIC AND ANAEROBIC Blood Culture results may not be optimal due to an excessive volume of blood received in culture bottles   Culture   Final    NO GROWTH 5 DAYS Performed at [facility], [address] [place], [place] [address]    Report Status 01/05/2017 FINAL  Final  MRSA PCR Screening     Status: None   Collection Time: 01/01/17  8:59 PM  Result Value Ref Range Status   MRSA by PCR NEGATIVE NEGATIVE Final    Comment:        The GeneXpert MRSA Assay (FDA approved for NASAL specimens only), is one component of a comprehensive MRSA colonization surveillance program. It is not intended to  diagnose MRSA infection nor to guide or monitor treatment for MRSA infections. Performed at [facility], [address], [place],  [address]   CULTURE, BLOOD (ROUTINE X 2) w  Reflex to ID Panel     Status: None   Collection Time: 01/02/17  5:30 AM  Result Value Ref Range Status   Specimen Description BLOOD RIGHT HAND  Final   Special Requests   Final    BOTTLES DRAWN AEROBIC AND ANAEROBIC Blood Culture adequate volume   Culture   Final    NO GROWTH 5 DAYS Performed at [facility], [address] [place], [place] [address]    Report Status 01/07/2017 FINAL  Final  CULTURE, BLOOD (ROUTINE X 2) w Reflex to ID Panel     Status: None   Collection Time: 01/02/17  5:30 AM  Result Value Ref Range Status   Specimen Description BLOOD LEFT HAND  Final   Special Requests   Final    BOTTLES DRAWN AEROBIC AND ANAEROBIC Blood Culture results may not be optimal due to an inadequate volume of blood received in culture bottles   Culture   Final    NO GROWTH 5 DAYS Performed at [facility], [address] [place], [place] [address]    Report Status 01/07/2017 FINAL  Final    RADIOLOGY:  No results found.  EKG:   Orders placed or performed during the hospital encounter of 12/31/16  . ED EKG  . ED EKG  . EKG 12-Lead  . EKG 12-Lead      Management plans discussed with the patient, son and granddaughter at bedside and they are in agreement.  CODE STATUS:     Code Status Orders  (From admission, onward)        Start     Ordered   12/31/16 1206  Do not attempt resuscitation (DNR)  Continuous    Question Answer Comment  In the event of cardiac or respiratory ARREST Do not call a "code blue"   In the event of cardiac or respiratory ARREST Do not perform Intubation, CPR, defibrillation or ACLS   In the event of cardiac or respiratory ARREST Use medication by any route, position, wound care, and other measures to relive pain and suffering. May  use oxygen, suction and manual treatment of airway obstruction as needed for comfort.      12/31/16 1205    Code Status History    Date Active Date Inactive Code Status Order ID Comments User Context   This patient has a current code status but no historical code status.    Advance Directive Documentation     Most Recent Value  Type of Advance Directive  Living will, Healthcare Power of Attorney  Pre-existing out of facility DNR order (yellow form or pink MOST form)  No data  "MOST" Form in Place?  No data      TOTAL TIME TAKING CARE OF THIS PATIENT: 45 minutes.   Note: This dictation was prepared with Dragon dictation along with smaller phrase technology. Any transcriptional errors that result from this process are unintentional.   @[facility] @  on 01/08/2017 at 11:28 AM  Between 7am to 6pm - Pager - [phone]  After 6pm go to [email or web address] - password [email or web address] [facility]  [facility]  Office  [phone]  CC: Primary care physician; Lynnell Jude, MD

## 2017-01-08 NOTE — Progress Notes (Signed)
[facility] to be D/C'd Skilled nursing facility per MD order. Patient given discharge teaching and paperwork regarding medications, diet, follow-up appointments and activity. Patient understanding verbalized. No questions or complaints at this time. Skin condition as charted. IV and telemetry removed prior to leaving.  No further needs by Care Management/Social Work. Prescriptions in packet prepared by Randall Hiss, SW.    An After Visit Summary was printed and given to the patient's granddaughter at bedside.   Report called to [place] at [facility]. EMS called for transport.  Terrilyn Saver

## 2017-01-08 NOTE — Progress Notes (Signed)
New referral for out patient Palliative to follow at [facility] received from [facility]. Plan is for discharge today. Patient information faxed to referral.  Flo Shanks RN, BSN, [facility] [place], hospital liaison [phone]

## 2017-01-08 NOTE — Progress Notes (Signed)
SUBJECTIVE: Patient is comfortable   Vitals:   01/07/17 1816 01/07/17 1932 01/08/17 0349 01/08/17 0733  BP:  133/62 131/64 112/62  Pulse:  76 91 75  Resp:  18 18 20   Temp:  98.2 F (36.8 C) 97.7 F (36.5 C) 97.8 F (36.6 C)  TempSrc:  Oral Oral   SpO2:  96% 95% 96%  Weight: 163 lb (73.9 kg)  162 lb (73.5 kg)   Height:        Intake/Output Summary (Last 24 hours) at 01/08/2017 0833 Last data filed at 01/08/2017 0355 Gross per 24 hour  Intake 3 ml  Output 0 ml  Net 3 ml    LABS: Basic Metabolic Panel: Recent Labs    01/06/17 0401  NA 140  K 3.7  CL 100*  CO2 33*  GLUCOSE 106*  BUN 31*  CREATININE 1.20*  CALCIUM 8.3*   Liver Function Tests: No results for input(s): AST, ALT, ALKPHOS, BILITOT, PROT, ALBUMIN in the last 72 hours. No results for input(s): LIPASE, AMYLASE in the last 72 hours. CBC: Recent Labs    01/06/17 0401  WBC 13.0*  HGB 10.9*  HCT 34.9*  MCV 81.8  PLT 284   Cardiac Enzymes: No results for input(s): CKTOTAL, CKMB, CKMBINDEX, TROPONINI in the last 72 hours. BNP: Invalid input(s): POCBNP D-Dimer: No results for input(s): DDIMER in the last 72 hours. Hemoglobin A1C: No results for input(s): HGBA1C in the last 72 hours. Fasting Lipid Panel: No results for input(s): CHOL, HDL, LDLCALC, TRIG, CHOLHDL, LDLDIRECT in the last 72 hours. Thyroid Function Tests: No results for input(s): TSH, T4TOTAL, T3FREE, THYROIDAB in the last 72 hours.  Invalid input(s): FREET3 Anemia Panel: No results for input(s): VITAMINB12, FOLATE, FERRITIN, TIBC, IRON, RETICCTPCT in the last 72 hours.   PHYSICAL EXAM General: Well developed, well nourished, in no acute distress HEENT:  Normocephalic and atramatic Neck:  No JVD.  Lungs: Clear bilaterally to auscultation and percussion. Heart: HRRR . Normal S1 and S2 without gallops or murmurs.  Abdomen: Bowel sounds are positive, abdomen soft and non-tender  Msk:  Back normal, normal gait. Normal strength and tone  for age. Extremities: No clubbing, cyanosis or edema.   Neuro: Alert and oriented X 3. Psych:  Good affect, responds appropriately  TELEMETRY: Atrial fibrillation with controlled ventricular rate  ASSESSMENT AND PLAN: Atrial fibrillation with controlled ventricular rate & history of hypoxemia due to congestive heart failure due to diastolic dysfunction. Patient has significantly improved since being on telemetry after transfer from ICU.  Active Problems:   CHF (congestive heart failure) (HCC)   Hypoxia   Atrial fibrillation with RVR [facility])   Palliative care encounter    Dionisio David, MD, [facility] 01/08/2017 8:33 AM

## 2017-01-12 NOTE — Progress Notes (Deleted)
   Patient ID: FERNANDA TWADDELL, female    DOB: [birth year], 85 y.o.   MRN: [id number]  HPI  Ms Steelman is a 84 y/o female with a history   Echo report from 09/14/15 reviewed and showed an EF of 55-60%.  Admitted 12/31/16 due to HF exacerbation. Initially needed IV diuretics and then transitioned to oral diuretics. Due to sepsis, IV antibiotics were started and then transitioned to oral antibiotics. Cardiology and palliative care consults were obtained. Discharged to SNF after 8 day.   She presents today for her initial visit with a chief complaint of   Review of Systems    Physical Exam    Assessment & Plan:  1: Chronic heart failure with preserved ejection fraction- - NYHA class - BNP from 12/31/16 was 1058.0 - followed by [facility] cardiology  2: HTN- - BMP from 01/06/17 reviewed and showed sodium 140, potassium 3.7 and GFR 40   3: Atrial fibrillation-

## 2017-01-13 ENCOUNTER — Ambulatory Visit: Payer: Medicare HMO | Admitting: Family

## 2017-01-19 NOTE — Progress Notes (Signed)
Patient ID: Mercedes Rodriguez, female    DOB: 1929-09-26, 84 y.o.   MRN: [id number]  HPI  Ms Grigoryan is a 84 y/o female with a history of atrial fibrillation, HTN, hearing loss and chronic heart failure.   Echo report from 09/14/15 reviewed and showed an EF of 55-60%.  Admitted 12/31/16 due to HF exacerbation. Cardiology and palliative care consults were obtained. Initially needed IV diuretics and then transitioned to oral diuretics. Needed IV cardizem drip to start with and then amiodarone drip for her atrial fibrillation. Changed to oral amiodarone. Elevated troponin thought to be due to demand ischemia. Discharged to SNF after 8 days.  She presents today for her initial visit with a chief complaint of minimal fatigue upon moderate exertion. She says this got worse a few weeks ago but seems to now be improving. Says that she gets tired when walking long distances. She has associated cough, shortness of breath, edema,difficulty sleeping and occasional dizziness. Denies any chest pain, palpitations or abdominal distention. Not currently getting weighed daily at [facility].   Past Medical History:  Diagnosis Date  . Arrhythmia   . Atrial fibrillation ([place])   . CHF (congestive heart failure) ([place])   . Hearing loss   . Hypertension    History reviewed. No pertinent surgical history. History reviewed. No pertinent family history. Social History   Tobacco Use  . Smoking status: Never Smoker  . Smokeless tobacco: Never Used  Substance Use Topics  . Alcohol use: No   No Known Allergies Prior to Admission medications   Medication Sig Start Date End Date Taking? Authorizing Provider  acetaminophen (TYLENOL) 325 MG tablet Take 2 tablets (650 mg total) by mouth every 6 (six) hours as needed for mild pain (or Fever >/= 101). 01/07/17  Yes Gouru, Illene Silver, MD  albuterol (PROVENTIL) (2.5 MG/3ML) 0.083% nebulizer solution Take 3 mLs (2.5 mg total) by nebulization every 6 (six) hours as needed for wheezing  or shortness of breath. 01/07/17  Yes Gouru, Illene Silver, MD  carbamide peroxide (DEBROX) 6.5 % OTIC solution Place 4 drops into both ears 2 (two) times daily.   Yes [provider]  dofetilide (TIKOSYN) 250 MCG capsule Take 250 mcg by mouth 2 (two) times daily.   Yes [provider]  furosemide (LASIX) 20 MG tablet Take 1 tablet (20 mg total) by mouth daily. 01/08/17  Yes Gouru, Aruna, MD  guaiFENesin-dextromethorphan (ROBITUSSIN DM) 100-10 MG/5ML syrup Take 10 mLs by mouth every 6 (six) hours as needed for cough. 01/07/17  Yes Gouru, Illene Silver, MD  HYDROcodone-acetaminophen (NORCO/VICODIN) 5-325 MG tablet Take 1-2 tablets by mouth every 4 (four) hours as needed for moderate pain. 01/07/17  Yes Gouru, Illene Silver, MD  metoprolol succinate (TOPROL-XL) 100 MG 24 hr tablet Take 100 mg by mouth daily. 11/19/16  Yes [provider]  rivaroxaban (XARELTO) 20 MG TABS tablet Take 20 mg by mouth daily. 10/27/16  Yes [provider]  senna-docusate (SENOKOT-S) 8.6-50 MG tablet Take 1 tablet by mouth at bedtime as needed for mild constipation. 01/07/17  Yes Gouru, Illene Silver, MD  amiodarone (PACERONE) 400 MG tablet Take 1 tablet (400 mg total) by mouth 2 (two) times daily. Patient not taking: Reported on 01/21/2017 01/07/17   [name], MD    Review of Systems  Constitutional: Positive for fatigue. Negative for appetite change.  HENT: Positive for hearing loss and postnasal drip. Negative for congestion and sore throat.   Eyes: Negative.   Respiratory: Positive for cough and shortness of  breath. Negative for chest tightness.   Cardiovascular: Positive for leg swelling. Negative for chest pain and palpitations.  Gastrointestinal: Negative for abdominal distention and abdominal pain.  Endocrine: Negative.   Genitourinary: Negative.   Musculoskeletal: Negative for back pain and neck pain.  Skin: Negative.   Allergic/Immunologic: Negative.   Neurological: Positive for dizziness (rarely). Negative for  light-headedness.  Hematological: Negative for adenopathy. Does not bruise/bleed easily.  Psychiatric/Behavioral: Positive for sleep disturbance (not sleeping well at facility). Negative for dysphoric mood. The patient is not nervous/anxious.    Vitals:   01/21/17 0900  BP: (!) 147/53  Pulse: (!) 48  Resp: 18  SpO2: 91%  Weight: 170 lb 8 oz (77.3 kg)  Height: 5\' 4"  (1.626 m)   Wt Readings from Last 3 Encounters:  01/21/17 170 lb 8 oz (77.3 kg)  01/08/17 162 lb (73.5 kg)  02/09/16 170 lb (77.1 kg)   Lab Results  Component Value Date   CREATININE 1.20 (H) 01/06/2017   CREATININE 1.24 (H) 01/05/2017   CREATININE 1.33 (H) 01/04/2017    Physical Exam  Constitutional: She is oriented to person, place, and time. She appears well-developed and well-nourished.  HENT:  Right Ear: Decreased hearing is noted.  Left Ear: Decreased hearing is noted.  Neck: Normal range of motion. Neck supple. No JVD present.  Cardiovascular: Regular rhythm. Bradycardia present.  Pulmonary/Chest: Effort normal. She has no wheezes. She has no rales.  Abdominal: Soft. She exhibits no distension. There is no tenderness.  Musculoskeletal: She exhibits edema (3+ pitting edema in bilateral lower legs). She exhibits no tenderness.  Neurological: She is alert and oriented to person, place, and time.  Skin: Skin is warm and dry.  Psychiatric: She has a normal mood and affect. Her behavior is normal. Thought content normal.  Nursing note and vitals reviewed.  Assessment & Plan:   1: Chronic heart failure with preserved ejection fraction- - NYHA class II - moderately fluid overloaded today with bilateral lower extremity edema - not being weighed daily so an order was written to weigh daily and call for an overnight weight gain of >2 pounds or a weekly weight gain of >5 pounds - not adding salt to her food - will increase her furosemide to 40mg  BID along with adding potassium 27meq BID - order written for TED  hose to be applied QAM with removal at bedtime - elevate legs when sitting and she says that she has a recliner that she sits in with her legs elevated - BNP from 12/31/16 was 1058.0 - will check labs at her next visit - patient reports receiving her flu vaccine for this season  2: HTN- - BP mildly elevated today - increasing diuretic per above - BMP from 01/06/17 reviewed and showed sodium 140, potassium 3.7 and GFR 40  3: Atrial fibrillation- - saw [facility] cardiology 10/03/16 - bradycardic today and amiodarone being held for a heart rate <50  4: Hard of hearing- - [facility] has order for patient to get an ENT referral - currently receiving debrox ear drops  Facility medication list was reviewed.  Return in 1 week or sooner for any questions/problems before then.

## 2017-01-21 ENCOUNTER — Ambulatory Visit: Payer: Medicare HMO | Attending: Family | Admitting: Family

## 2017-01-21 ENCOUNTER — Encounter: Payer: Self-pay | Admitting: Family

## 2017-01-21 VITALS — BP 147/53 | HR 48 | Resp 18 | Ht 64.0 in | Wt 170.5 lb

## 2017-01-21 DIAGNOSIS — M7989 Other specified soft tissue disorders: Secondary | ICD-10-CM | POA: Insufficient documentation

## 2017-01-21 DIAGNOSIS — I110 Hypertensive heart disease with heart failure: Secondary | ICD-10-CM | POA: Insufficient documentation

## 2017-01-21 DIAGNOSIS — R05 Cough: Secondary | ICD-10-CM | POA: Diagnosis not present

## 2017-01-21 DIAGNOSIS — H9190 Unspecified hearing loss, unspecified ear: Secondary | ICD-10-CM | POA: Insufficient documentation

## 2017-01-21 DIAGNOSIS — Z7901 Long term (current) use of anticoagulants: Secondary | ICD-10-CM | POA: Diagnosis not present

## 2017-01-21 DIAGNOSIS — I4891 Unspecified atrial fibrillation: Secondary | ICD-10-CM | POA: Insufficient documentation

## 2017-01-21 DIAGNOSIS — R5383 Other fatigue: Secondary | ICD-10-CM | POA: Insufficient documentation

## 2017-01-21 DIAGNOSIS — R001 Bradycardia, unspecified: Secondary | ICD-10-CM | POA: Insufficient documentation

## 2017-01-21 DIAGNOSIS — I5032 Chronic diastolic (congestive) heart failure: Secondary | ICD-10-CM | POA: Diagnosis present

## 2017-01-21 DIAGNOSIS — R42 Dizziness and giddiness: Secondary | ICD-10-CM | POA: Diagnosis not present

## 2017-01-21 DIAGNOSIS — Z23 Encounter for immunization: Secondary | ICD-10-CM | POA: Insufficient documentation

## 2017-01-21 DIAGNOSIS — I10 Essential (primary) hypertension: Secondary | ICD-10-CM

## 2017-01-21 DIAGNOSIS — Z79899 Other long term (current) drug therapy: Secondary | ICD-10-CM | POA: Diagnosis not present

## 2017-01-21 DIAGNOSIS — R0602 Shortness of breath: Secondary | ICD-10-CM | POA: Insufficient documentation

## 2017-01-21 DIAGNOSIS — H9193 Unspecified hearing loss, bilateral: Secondary | ICD-10-CM

## 2017-01-21 NOTE — Patient Instructions (Signed)
Begin weighing daily and call for an overnight weight gain of > 2 pounds or a weekly weight gain of >5 pounds. 

## 2017-01-27 LAB — CULTURE, BLOOD (ROUTINE X 2): Special Requests: ADEQUATE

## 2017-01-28 NOTE — Progress Notes (Signed)
Patient ID: Mercedes Rodriguez, female    DOB: 08/15/1929, 84 y.o.   MRN: [id number]  HPI  Ms Faidley is a 84 y/o female with a history of atrial fibrillation, HTN, hearing loss and chronic heart failure.   Echo report from 09/14/15 reviewed and showed an EF of 55-60%.  Admitted 12/31/16 due to HF exacerbation. Cardiology and palliative care consults were obtained. Initially needed IV diuretics and then transitioned to oral diuretics. Needed IV cardizem drip to start with and then amiodarone drip for her atrial fibrillation. Changed to oral amiodarone. Elevated troponin thought to be due to demand ischemia. Discharged to SNF after 8 days.  She presents today for a follow-up visit with a chief complaint of mild shortness of breath upon moderate exertion. She says that her breathing has improved from when she was here last week. She has associated fatigue, cough, hearing loss, nosebleed (today) and easy bruising. She denies any chest pain, dizziness, edema, palpitations, difficulty sleeping or weight gain.   Past Medical History:  Diagnosis Date  . Arrhythmia   . Atrial fibrillation ([place])   . CHF (congestive heart failure) ([place])   . Hearing loss   . Hypertension    No past surgical history on file. No family history on file. Social History   Tobacco Use  . Smoking status: Never Smoker  . Smokeless tobacco: Never Used  Substance Use Topics  . Alcohol use: No   No Known Allergies  Prior to Admission medications   Medication Sig Start Date End Date Taking? Authorizing Provider  acetaminophen (TYLENOL) 325 MG tablet Take 2 tablets (650 mg total) by mouth every 6 (six) hours as needed for mild pain (or Fever >/= 101). 01/07/17  Yes Gouru, Illene Silver, MD  albuterol (PROVENTIL) (2.5 MG/3ML) 0.083% nebulizer solution Take 3 mLs (2.5 mg total) by nebulization every 6 (six) hours as needed for wheezing or shortness of breath. 01/07/17  Yes Gouru, Illene Silver, MD  carbamide peroxide (DEBROX) 6.5 % OTIC solution  Place 4 drops into both ears 2 (two) times daily.   Yes [provider]  dofetilide (TIKOSYN) 250 MCG capsule Take 250 mcg by mouth 2 (two) times daily.   Yes [provider]  furosemide (LASIX) 40 MG tablet Take 40 mg by mouth 2 (two) times daily.   Yes [provider]  guaiFENesin-dextromethorphan (ROBITUSSIN DM) 100-10 MG/5ML syrup Take 10 mLs by mouth every 6 (six) hours as needed for cough. 01/07/17  Yes Gouru, Illene Silver, MD  HYDROcodone-acetaminophen (NORCO/VICODIN) 5-325 MG tablet Take 1-2 tablets by mouth every 4 (four) hours as needed for moderate pain. 01/07/17  Yes Gouru, Illene Silver, MD  metoprolol succinate (TOPROL-XL) 100 MG 24 hr tablet Take 100 mg by mouth daily. 11/19/16  Yes [provider]  potassium chloride SA (K-DUR,KLOR-CON) 20 MEQ tablet Take 20 mEq by mouth 2 (two) times daily.   Yes [provider]  rivaroxaban (XARELTO) 20 MG TABS tablet Take 20 mg by mouth daily. 10/27/16  Yes [provider]  senna-docusate (SENOKOT-S) 8.6-50 MG tablet Take 1 tablet by mouth at bedtime as needed for mild constipation. 01/07/17  Yes [name], MD    Review of Systems  Constitutional: Positive for fatigue. Negative for appetite change.  HENT: Positive for hearing loss, nosebleeds (today) and postnasal drip. Negative for congestion and sore throat.   Eyes: Negative.   Respiratory: Positive for cough and shortness of breath (better). Negative for chest tightness.   Cardiovascular: Negative for chest pain, palpitations and  leg swelling.  Gastrointestinal: Negative for abdominal distention and abdominal pain.  Endocrine: Negative.   Genitourinary: Negative.   Musculoskeletal: Negative for back pain and neck pain.  Skin: Negative.   Allergic/Immunologic: Negative.   Neurological: Negative for dizziness and light-headedness.  Hematological: Negative for adenopathy. Bruises/bleeds easily.  Psychiatric/Behavioral: Negative for dysphoric mood and  sleep disturbance (sleeping better). The patient is not nervous/anxious.    Vitals:   01/29/17 0839  BP: (!) 124/50  Pulse: (!) 54  Resp: 18  SpO2: 94%  Weight: 160 lb 4 oz (72.7 kg)  Height: 5\' 4"  (1.626 m)   Wt Readings from Last 3 Encounters:  01/29/17 160 lb 4 oz (72.7 kg)  01/21/17 170 lb 8 oz (77.3 kg)  01/08/17 162 lb (73.5 kg)   Lab Results  Component Value Date   CREATININE 1.20 (H) 01/06/2017   CREATININE 1.24 (H) 01/05/2017   CREATININE 1.33 (H) 01/04/2017    Physical Exam  Constitutional: She is oriented to person, place, and time. She appears well-developed and well-nourished.  HENT:  Right Ear: Decreased hearing is noted.  Left Ear: Decreased hearing is noted.  Neck: Normal range of motion. Neck supple. No JVD present.  Cardiovascular: Regular rhythm. Bradycardia present.  Pulmonary/Chest: Effort normal. She has no wheezes. She has no rales.  Abdominal: Soft. She exhibits no distension. There is no tenderness.  Musculoskeletal: She exhibits no edema or tenderness.  Neurological: She is alert and oriented to person, place, and time.  Skin: Skin is warm and dry.  Psychiatric: She has a normal mood and affect. Her behavior is normal. Thought content normal.  Nursing note and vitals reviewed.  Assessment & Plan:   1: Chronic heart failure with preserved ejection fraction- - NYHA class II - euvolemic today - order written last week for her to be weighed daily although it's still not being done. Order written for the facility to weigh her daily per previous order and to call for an overnight weight gain of >2 pounds or a weekly weight gain of >5 pounds - not adding salt to her food - now taking furosemide and potassium BID - wearing TED hose daily - no edema today - elevate legs when sitting and she says that she has a recliner that she sits in with her legs elevated - BNP from 12/31/16 was 1058.0 - will check labs today - patient reports receiving her flu  vaccine for this season  2: HTN- - BP looks good today - BMP from 01/06/17 reviewed and showed sodium 140, potassium 3.7 and GFR 40  3: Atrial fibrillation- - saw [facility] cardiology 10/03/16 - bradycardic today and amiodarone being held for a heart rate <50  4: Hard of hearing- - [facility] has order for patient to get an ENT referral - currently receiving debrox ear drops  Facility medication list was reviewed.  Return in 2 months or sooner for any questions/problems before then.

## 2017-01-29 ENCOUNTER — Encounter: Payer: Self-pay | Admitting: Family

## 2017-01-29 ENCOUNTER — Telehealth: Payer: Self-pay | Admitting: Family

## 2017-01-29 ENCOUNTER — Ambulatory Visit: Payer: Medicare HMO | Attending: Family | Admitting: Family

## 2017-01-29 VITALS — BP 124/50 | HR 54 | Resp 18 | Ht 64.0 in | Wt 160.2 lb

## 2017-01-29 DIAGNOSIS — I110 Hypertensive heart disease with heart failure: Secondary | ICD-10-CM | POA: Insufficient documentation

## 2017-01-29 DIAGNOSIS — I4891 Unspecified atrial fibrillation: Secondary | ICD-10-CM

## 2017-01-29 DIAGNOSIS — Z7901 Long term (current) use of anticoagulants: Secondary | ICD-10-CM | POA: Diagnosis not present

## 2017-01-29 DIAGNOSIS — Z79899 Other long term (current) drug therapy: Secondary | ICD-10-CM | POA: Insufficient documentation

## 2017-01-29 DIAGNOSIS — R0602 Shortness of breath: Secondary | ICD-10-CM | POA: Diagnosis present

## 2017-01-29 DIAGNOSIS — H9193 Unspecified hearing loss, bilateral: Secondary | ICD-10-CM

## 2017-01-29 DIAGNOSIS — I509 Heart failure, unspecified: Secondary | ICD-10-CM | POA: Diagnosis not present

## 2017-01-29 DIAGNOSIS — I5032 Chronic diastolic (congestive) heart failure: Secondary | ICD-10-CM

## 2017-01-29 DIAGNOSIS — I10 Essential (primary) hypertension: Secondary | ICD-10-CM

## 2017-01-29 LAB — BASIC METABOLIC PANEL
Anion gap: 5 (ref 5–15)
BUN: 24 mg/dL — ABNORMAL HIGH (ref 6–20)
CALCIUM: 8.1 mg/dL — AB (ref 8.9–10.3)
CHLORIDE: 101 mmol/L (ref 101–111)
CO2: 32 mmol/L (ref 22–32)
CREATININE: 2.3 mg/dL — AB (ref 0.44–1.00)
GFR calc non Af Amer: 18 mL/min — ABNORMAL LOW (ref >60)
GFR, EST AFRICAN AMERICAN: 21 mL/min — AB (ref >60)
GLUCOSE: 99 mg/dL (ref 65–99)
Potassium: 4.2 mmol/L (ref 3.5–5.1)
Sodium: 138 mmol/L (ref 135–145)

## 2017-01-29 NOTE — Telephone Encounter (Signed)
Received lab results back today (01/29/17). Potassium level is normal but renal function has declined since increasing her furosemide/potassium to twice daily. Spoke with Yvetta Coder at [facility] and advised her to decrease furosemide to 40mg  once daily along with decreasing her potassium to 19meq once daily. Will also recheck labs on 02/04/17 and have results faxed to [place].  Written instructions of medication change and lab draw was faxed to [facility] as well.

## 2017-01-29 NOTE — Patient Instructions (Signed)
Continue weighing daily and call for an overnight weight gain of > 2 pounds or a weekly weight gain of >5 pounds. 

## 2017-02-16 ENCOUNTER — Other Ambulatory Visit: Payer: Self-pay | Admitting: Family

## 2017-02-20 ENCOUNTER — Other Ambulatory Visit: Payer: Self-pay

## 2017-02-20 ENCOUNTER — Emergency Department

## 2017-02-20 ENCOUNTER — Inpatient Hospital Stay
Admission: EM | Admit: 2017-02-20 | Discharge: 2017-02-23 | DRG: 309 | Disposition: A | Discharge status: Hospice | Attending: Internal Medicine | Admitting: Internal Medicine

## 2017-02-20 DIAGNOSIS — I959 Hypotension, unspecified: Secondary | ICD-10-CM | POA: Diagnosis present

## 2017-02-20 DIAGNOSIS — Z7901 Long term (current) use of anticoagulants: Secondary | ICD-10-CM

## 2017-02-20 DIAGNOSIS — G4700 Insomnia, unspecified: Secondary | ICD-10-CM | POA: Diagnosis present

## 2017-02-20 DIAGNOSIS — R001 Bradycardia, unspecified: Principal | ICD-10-CM | POA: Diagnosis present

## 2017-02-20 DIAGNOSIS — D649 Anemia, unspecified: Secondary | ICD-10-CM | POA: Diagnosis present

## 2017-02-20 DIAGNOSIS — Z66 Do not resuscitate: Secondary | ICD-10-CM | POA: Diagnosis present

## 2017-02-20 DIAGNOSIS — H9190 Unspecified hearing loss, unspecified ear: Secondary | ICD-10-CM | POA: Diagnosis present

## 2017-02-20 DIAGNOSIS — I482 Chronic atrial fibrillation: Secondary | ICD-10-CM | POA: Diagnosis present

## 2017-02-20 DIAGNOSIS — Z515 Encounter for palliative care: Secondary | ICD-10-CM | POA: Diagnosis present

## 2017-02-20 DIAGNOSIS — I5032 Chronic diastolic (congestive) heart failure: Secondary | ICD-10-CM | POA: Diagnosis present

## 2017-02-20 DIAGNOSIS — N183 Chronic kidney disease, stage 3 (moderate): Secondary | ICD-10-CM | POA: Diagnosis present

## 2017-02-20 DIAGNOSIS — I130 Hypertensive heart and chronic kidney disease with heart failure and stage 1 through stage 4 chronic kidney disease, or unspecified chronic kidney disease: Secondary | ICD-10-CM | POA: Diagnosis present

## 2017-02-20 DIAGNOSIS — Z79899 Other long term (current) drug therapy: Secondary | ICD-10-CM

## 2017-02-20 DIAGNOSIS — N179 Acute kidney failure, unspecified: Secondary | ICD-10-CM | POA: Diagnosis present

## 2017-02-20 LAB — BASIC METABOLIC PANEL
ANION GAP: 6 (ref 5–15)
BUN: 39 mg/dL — AB (ref 6–20)
CHLORIDE: 109 mmol/L (ref 101–111)
CO2: 24 mmol/L (ref 22–32)
Calcium: 8.5 mg/dL — ABNORMAL LOW (ref 8.9–10.3)
Creatinine, Ser: 2.88 mg/dL — ABNORMAL HIGH (ref 0.44–1.00)
GFR calc Af Amer: 16 mL/min — ABNORMAL LOW (ref >60)
GFR, EST NON AFRICAN AMERICAN: 14 mL/min — AB (ref >60)
Glucose, Bld: 106 mg/dL — ABNORMAL HIGH (ref 65–99)
POTASSIUM: 4.9 mmol/L (ref 3.5–5.1)
Sodium: 139 mmol/L (ref 135–145)

## 2017-02-20 LAB — CBC
HEMATOCRIT: 30.6 % — AB (ref 35.0–47.0)
HEMOGLOBIN: 9.6 g/dL — AB (ref 12.0–16.0)
MCH: 27 pg (ref 26.0–34.0)
MCHC: 31.3 g/dL — ABNORMAL LOW (ref 32.0–36.0)
MCV: 86.3 fL (ref 80.0–100.0)
Platelets: 153 10*3/uL (ref 150–440)
RBC: 3.54 MIL/uL — ABNORMAL LOW (ref 3.80–5.20)
RDW: 23 % — ABNORMAL HIGH (ref 11.5–14.5)
WBC: 7.7 10*3/uL (ref 3.6–11.0)

## 2017-02-20 LAB — TROPONIN I
Troponin I: <0.03 ng/mL (ref <0.03)
Troponin I: <0.03 ng/mL (ref <0.03)

## 2017-02-20 MED ORDER — AMIODARONE HCL 200 MG PO TABS
400.0000 mg | ORAL_TABLET | Freq: Two times a day (BID) | ORAL | Status: DC
  Administered 2017-02-20: 400 mg via ORAL
  Filled 2017-02-20: qty 2

## 2017-02-20 MED ORDER — HYOSCYAMINE SULFATE 0.125 MG PO TABS
0.1250 mg | ORAL_TABLET | Freq: Four times a day (QID) | ORAL | Status: DC | PRN
  Filled 2017-02-20: qty 1

## 2017-02-20 MED ORDER — HYDROCODONE-ACETAMINOPHEN 5-325 MG PO TABS
1.0000 | ORAL_TABLET | ORAL | Status: DC | PRN
  Administered 2017-02-22: 1 via ORAL
  Filled 2017-02-20: qty 1

## 2017-02-20 MED ORDER — GUAIFENESIN-DM 100-10 MG/5ML PO SYRP: 10.0000 mL | ORAL_SOLUTION | Freq: Four times a day (QID) | ORAL | Status: DC | PRN

## 2017-02-20 MED ORDER — ATROPINE SULFATE 1 MG/10ML IJ SOSY
PREFILLED_SYRINGE | INTRAMUSCULAR | Status: AC
  Filled 2017-02-20: qty 10

## 2017-02-20 MED ORDER — CARBAMIDE PEROXIDE 6.5 % OT SOLN
4.0000 [drp] | Freq: Two times a day (BID) | OTIC | Status: DC
  Administered 2017-02-20 – 2017-02-23 (×6): 4 [drp] via OTIC
  Filled 2017-02-20: qty 15

## 2017-02-20 MED ORDER — SODIUM CHLORIDE 0.9% FLUSH
3.0000 mL | Freq: Two times a day (BID) | INTRAVENOUS | Status: DC
  Administered 2017-02-21 – 2017-02-23 (×4): 3 mL via INTRAVENOUS

## 2017-02-20 MED ORDER — FUROSEMIDE 40 MG PO TABS
40.0000 mg | ORAL_TABLET | Freq: Two times a day (BID) | ORAL | Status: DC
  Administered 2017-02-20: 40 mg via ORAL
  Filled 2017-02-20: qty 1

## 2017-02-20 MED ORDER — RIVAROXABAN 15 MG PO TABS
15.0000 mg | ORAL_TABLET | Freq: Every day | ORAL | Status: DC
  Administered 2017-02-20 – 2017-02-22 (×3): 15 mg via ORAL
  Filled 2017-02-20 (×3): qty 1

## 2017-02-20 MED ORDER — ALBUTEROL SULFATE (2.5 MG/3ML) 0.083% IN NEBU: 2.5000 mg | INHALATION_SOLUTION | Freq: Four times a day (QID) | RESPIRATORY_TRACT | Status: DC | PRN

## 2017-02-20 MED ORDER — MORPHINE SULFATE (CONCENTRATE) 10 MG/0.5ML PO SOLN: 5.0000 mg | ORAL | Status: DC | PRN

## 2017-02-20 MED ORDER — ACETAMINOPHEN 325 MG PO TABS: 650.0000 mg | ORAL_TABLET | ORAL | Status: DC | PRN

## 2017-02-20 MED ORDER — ONDANSETRON HCL 4 MG/2ML IJ SOLN: 4.0000 mg | Freq: Four times a day (QID) | INTRAMUSCULAR | Status: DC | PRN

## 2017-02-20 MED ORDER — POTASSIUM CHLORIDE CRYS ER 20 MEQ PO TBCR
20.0000 meq | EXTENDED_RELEASE_TABLET | Freq: Two times a day (BID) | ORAL | Status: DC
  Administered 2017-02-20 – 2017-02-21 (×2): 20 meq via ORAL
  Filled 2017-02-20 (×2): qty 1

## 2017-02-20 MED ORDER — ATROPINE SULFATE 1 MG/10ML IJ SOSY
0.5000 mg | PREFILLED_SYRINGE | INTRAMUSCULAR | Status: DC | PRN
  Administered 2017-02-20: 0.5 mg via INTRAVENOUS
  Filled 2017-02-20: qty 10

## 2017-02-20 MED ORDER — ACETAMINOPHEN 325 MG PO TABS: 650.0000 mg | ORAL_TABLET | Freq: Four times a day (QID) | ORAL | Status: DC | PRN

## 2017-02-20 MED ORDER — HALOPERIDOL 1 MG PO TABS
1.0000 mg | ORAL_TABLET | Freq: Four times a day (QID) | ORAL | Status: DC | PRN
  Filled 2017-02-20: qty 1

## 2017-02-20 MED ORDER — SODIUM CHLORIDE 0.9 % IV BOLUS (SEPSIS)
250.0000 mL | Freq: Once | INTRAVENOUS | Status: AC
  Administered 2017-02-20: 250 mL via INTRAVENOUS

## 2017-02-20 MED ORDER — RIVAROXABAN 20 MG PO TABS: 20.0000 mg | ORAL_TABLET | Freq: Every day | ORAL | Status: DC

## 2017-02-20 MED ORDER — ATROPINE SULFATE 1 MG/10ML IJ SOSY
0.5000 mg | PREFILLED_SYRINGE | Freq: Once | INTRAMUSCULAR | Status: AC
  Administered 2017-02-20: 0.5 mg via INTRAVENOUS

## 2017-02-20 MED ORDER — LORAZEPAM 0.5 MG PO TABS: 0.5000 mg | ORAL_TABLET | ORAL | Status: DC | PRN

## 2017-02-20 MED ORDER — DOFETILIDE 250 MCG PO CAPS: 250.0000 ug | ORAL_CAPSULE | Freq: Two times a day (BID) | ORAL | Status: DC

## 2017-02-20 MED ORDER — SENNOSIDES-DOCUSATE SODIUM 8.6-50 MG PO TABS: 1.0000 | ORAL_TABLET | Freq: Every evening | ORAL | Status: DC | PRN

## 2017-02-20 MED ORDER — ATROPINE SULFATE 1 MG/10ML IJ SOSY: 1.0000 mg | PREFILLED_SYRINGE | Freq: Once | INTRAMUSCULAR | Status: DC

## 2017-02-20 NOTE — ED Triage Notes (Signed)
Pt brought in by [facility] from home.  Pt stated to EMS that she just wanted to be transported to hospital.  Vague complaints of back and chest pain, but pt is inconsistent with this story.  Pt found to be bradycardic from 20-40.  Pt states she doesn't feel any weaker than normal.  Pt is A&Ox4.

## 2017-02-20 NOTE — Progress Notes (Signed)
ED Visit made. Patient is currently followed by [facility] at home with a diagnosis of heart failure and CKD stage IV. She is a DNR code with out of facility DNR in place in the home.  She was brought to the [facility] ED today via EMS for evaluation of back and chest pain.  Please note patient had spoken just prior with her hospice nurse, she complained of low back pain when standing and walking. This pain went away when she sat down. Hospice RN was in contact with patient's MD regarding pain and new pain medication, but patient's son called EMS prior to any new medication being available. Upon arrival to the ED patient was found to be bradycardic with heart rate down in to the low 20's. Patient seen sitting up on the ED stretcher, alert and oriented, denied back pain, did report some left sided neck pain. Family reports she was "hurting all over" and needed to be seen in the ED. Several family members present including her son and cousin. Much support and education given. Plan at this time is for admission, patient and family  agreeable. She also confirmed her DNR status with staff RN Iris. Hospice team upadted to admission. Flo Shanks RN, BSN, [place] [facility] [place], [facility] [place] [phone]

## 2017-02-20 NOTE — H&P (Signed)
[facility] NAME: Mercedes Rodriguez    MR#:  [id number]  DATE OF BIRTH:  19-Jul-1929  DATE OF ADMISSION:  02/20/2017  PRIMARY CARE PHYSICIAN: Lynnell Jude, MD   REQUESTING/REFERRING PHYSICIAN: [place]  CHIEF COMPLAINT:  Chest pain and low heart rate  HISTORY OF PRESENT ILLNESS:  Mercedes Rodriguez  is a 84 y.o. female with a known history of chronic atrial fibrillation, chronic diastolic congestive heart failure, currently on hospice care is brought into the ED as patient was complaining of chest pain radiating to the back.  Patient was found to be bradycardic with heart rate 29-30.  Patient was given atropine.  Blood pressure was normal.  Patient denies any dizziness or shortness of breath.  Resting comfortably during my examination after hospitalist team is called to admit the patient.  Family members at bedside.  PAST MEDICAL HISTORY:   Past Medical History:  Diagnosis Date  . Arrhythmia   . Atrial fibrillation ([place])   . CHF (congestive heart failure) ([place])   . Hearing loss   . Hypertension     PAST SURGICAL HISTOIRY:  History reviewed. No pertinent surgical history.  SOCIAL HISTORY:   Social History   Tobacco Use  . Smoking status: Never Smoker  . Smokeless tobacco: Never Used  Substance Use Topics  . Alcohol use: No    FAMILY HISTORY:  Hypertension runs in her family  DRUG ALLERGIES:  No Known Allergies  REVIEW OF SYSTEMS:  CONSTITUTIONAL: No fever, fatigue or weakness.  EYES: No blurred or double vision.  EARS, NOSE, AND THROAT: No tinnitus or ear pain.  RESPIRATORY: No cough, shortness of breath, wheezing or hemoptysis.  CARDIOVASCULAR: No chest pain, orthopnea, edema.  GASTROINTESTINAL: No nausea, vomiting, diarrhea or abdominal pain.  GENITOURINARY: No dysuria, hematuria.  ENDOCRINE: No polyuria, nocturia,  HEMATOLOGY: No anemia, easy bruising or bleeding SKIN: No rash or lesion. MUSCULOSKELETAL:  No joint pain or arthritis.   NEUROLOGIC: No tingling, numbness, weakness.  PSYCHIATRY: No anxiety or depression.   MEDICATIONS AT HOME:   Prior to Admission medications   Medication Sig Start Date End Date Taking? Authorizing Provider  acetaminophen (TYLENOL) 650 MG suppository Place 650 mg rectally every 4 (four) hours as needed.   Yes [provider]  albuterol (PROVENTIL) (2.5 MG/3ML) 0.083% nebulizer solution Take 3 mLs (2.5 mg total) by nebulization every 6 (six) hours as needed for wheezing or shortness of breath. 01/07/17  Yes Shloime Keilman, Illene Silver, MD  amiodarone (PACERONE) 200 MG tablet Take 400 mg by mouth 2 (two) times daily.   Yes [provider]  carbamide peroxide (DEBROX) 6.5 % OTIC solution Place 4 drops into both ears 2 (two) times daily.   Yes [provider]  dofetilide (TIKOSYN) 250 MCG capsule Take 250 mcg by mouth 2 (two) times daily.   Yes [provider]  furosemide (LASIX) 40 MG tablet Take 40 mg by mouth 2 (two) times daily.   Yes [provider]  guaiFENesin-dextromethorphan (ROBITUSSIN DM) 100-10 MG/5ML syrup Take 10 mLs by mouth every 6 (six) hours as needed for cough. 01/07/17  Yes Tabathia Knoche, Illene Silver, MD  haloperidol (HALDOL) 1 MG tablet Take 1 mg by mouth every 6 (six) hours as needed for agitation.   Yes [provider]  hyoscyamine (LEVSIN, ANASPAZ) 0.125 MG tablet Take 0.125 mg by mouth. Every 4 to 6 hours as needed [secretions]   Yes [provider]  LORazepam (ATIVAN) 0.5 MG tablet  Take 0.5 mg by mouth every 4 (four) hours as needed for anxiety.   Yes [provider]  metoprolol succinate (TOPROL-XL) 100 MG 24 hr tablet Take 100 mg by mouth daily. 11/19/16  Yes [provider]  Morphine Sulfate (MORPHINE CONCENTRATE) 10 mg / 0.5 ml concentrated solution Take 5 mg by mouth every hour as needed for severe pain or shortness of breath.   Yes [provider]  potassium chloride SA  (K-DUR,KLOR-CON) 20 MEQ tablet Take 20 mEq by mouth 2 (two) times daily.   Yes [provider]  rivaroxaban (XARELTO) 20 MG TABS tablet Take 20 mg by mouth daily. 10/27/16  Yes [provider]  senna-docusate (SENOKOT-S) 8.6-50 MG tablet Take 1 tablet by mouth at bedtime as needed for mild constipation. 01/07/17  Yes Boen Sterbenz, Illene Silver, MD  acetaminophen (TYLENOL) 325 MG tablet Take 2 tablets (650 mg total) by mouth every 6 (six) hours as needed for mild pain (or Fever >/= 101). Patient not taking: Reported on 02/20/2017 01/07/17   [name], MD  HYDROcodone-acetaminophen (NORCO/VICODIN) 5-325 MG tablet Take 1-2 tablets by mouth every 4 (four) hours as needed for moderate pain. Patient not taking: Reported on 02/20/2017 01/07/17   [name], MD      VITAL SIGNS:  Blood pressure 123/65, pulse (!) 44, temperature 97.9 F (36.6 C), temperature source Oral, resp. rate 16, weight 72.6 kg (160 lb), SpO2 100 %.  PHYSICAL EXAMINATION:  GENERAL:  84 y.o.-year-old patient lying in the bed with no acute distress.  Cachectic female EYES: Pupils equal, round, reactive to light and accommodation. No scleral icterus. Extraocular muscles intact.  HEENT: Head atraumatic, normocephalic. Oropharynx and nasopharynx clear.  NECK:  Supple, no jugular venous distention. No thyroid enlargement, no tenderness.  LUNGS: Normal breath sounds bilaterally, no wheezing, rales,rhonchi or crepitation. No use of accessory muscles of respiration.  CARDIOVASCULAR: S1, S2 normal. No murmurs, rubs, or gallops.  ABDOMEN: Soft, nontender, nondistended. Bowel sounds present.  EXTREMITIES: No pedal edema, cyanosis, or clubbing.  NEUROLOGIC: Awake, alert and oriented x3 PSYCHIATRIC: The patient is alert and oriented x 3.  SKIN: No obvious rash, lesion, or ulcer.   LABORATORY PANEL:   CBC Recent Labs  Lab 02/20/17 1344  WBC 7.7  HGB 9.6*  HCT 30.6*  PLT 153    ------------------------------------------------------------------------------------------------------------------  Chemistries  Recent Labs  Lab 02/20/17 1344  NA 139  K 4.9  CL 109  CO2 24  GLUCOSE 106*  BUN 39*  CREATININE 2.88*  CALCIUM 8.5*   ------------------------------------------------------------------------------------------------------------------  Cardiac Enzymes Recent Labs  Lab 02/20/17 1634  TROPONINI <0.03   ------------------------------------------------------------------------------------------------------------------  RADIOLOGY:  Dg Chest Portable 1 View  Result Date: 02/20/2017 CLINICAL DATA:  Chest pain EXAM: PORTABLE CHEST 1 VIEW COMPARISON:  01/03/2017 chest radiograph. FINDINGS: Pacer pad overlies the left chest. Partially visualized surgical hardware overlying the lower cervical spine. Stable cardiomediastinal silhouette with mild cardiomegaly. No pneumothorax. Trace dependent bilateral pleural effusions, stable. Mild pulmonary edema, decreased. IMPRESSION: Mild congestive heart failure, improved. Trace dependent bilateral pleural effusions, stable. Electronically Signed   By: Ilona Sorrel M.D.   On: 02/20/2017 14:06    EKG:   Orders placed or performed during the hospital encounter of 02/20/17  . ED EKG within 10 minutes  . ED EKG within 10 minutes    IMPRESSION AND PLAN:   Mercedes Rodriguez  is a 84 y.o. female with a known history of chronic atrial fibrillation, chronic diastolic congestive heart failure, currently on hospice care  is brought into the ED as patient was complaining of chest pain radiating to the back.  Patient was found to be bradycardic with heart rate 29-30.  Patient was given atropine.  Blood pressure was normal.  Patient denies any dizziness or shortness of breath.  #Chest pain Admit to telemetry Cycle cardiac biomarkers Cardiology consult is placed to Dr. Humphrey Rolls   #Sinus bradycardia Currently patient is a  symptomatic Heart rate dropped down to 29-30 Patient refused pacemaker in the past Cardiology consult placed to Dr. Humphrey Rolls Atropine as needed   #Chronic atrial fibrillation rate controlled  continue Xarelto Holding beta-blocker in view of hypotension.     #Chronic diastolic congestive heart failure Not fluid overloaded at this time Continue Lasix with holding parameters  DVT prophylaxis with Xarelto  Disposition: Anticipate  to home with hospice care All the records are reviewed and case discussed with ED provider. Management plans discussed with the patient, family and they are in agreement.  CODE STATUS: dnr, hospice care at home  [place]: 50minutes.   Note: This dictation was prepared with Dragon dictation along with smaller phrase technology. Any transcriptional errors that result from this process are unintentional.  [name] M.D on 02/20/2017 at 7:38 PM  Between 7am to 6pm - Pager - [phone]  After 6pm go to [email or web address] - password [email or web address] [facility]  [facility]  Office  [phone]  CC: Primary care physician; Lynnell Jude, MD

## 2017-02-20 NOTE — Progress Notes (Signed)
Family Meeting Note  Advance Directive:yes  Today a meeting took place with the Patient , niece at bedside     The following clinical team members were present during this meeting:MD  The following were discussed:Patient's diagnosis: Chest Chest pain, sinus bradycardia, Chronic atrial fibrillation, chronic diastolic congestive heart failure  patient's progosis: < 12 months and Goals for treatment: DNR, hospice care, son is the healthcare power of attorney  Additional follow-up to be provided: Hospitalist and cardiology  Time spent during discussion:18 min  [name], MD

## 2017-02-20 NOTE — ED Notes (Signed)
Pt having low blood pressure and HR steady in the 20-26.  Spoke with Dr. Clearnce Hasten, VO given for 276ml NS bolus and 0.5mg  atropin IV push.  Read back and verified.

## 2017-02-20 NOTE — Progress Notes (Addendum)
HR running in mid 20's 0.5 of atropin given. HR now 40. Pt. Sitting and talking with the staff, no c/o of pain, SOB or acute distress observed.  Will continue to monitor pt.

## 2017-02-20 NOTE — ED Provider Notes (Signed)
[facility] Emergency Department Provider Note  ____________________________________________   First MD Initiated Contact with Patient 02/20/17 1349     (approximate)  I have reviewed the triage vital signs and the nursing notes.   HISTORY  Chief Complaint Back Pain   HPI Mercedes Rodriguez is a 84 y.o. female with a history of atrial fibrillation on Tigas and and Eliquis who is presenting to the emergency department today with intermittent chest pain as well as back pain.  She was admitted this past December secondary to congestive heart failure.  She was then sent to a rehab facility but has been home for 2 weeks at this time.  Patient is been complaining intermittent back pain and mild generalized weakness although she denies any back pain or chest pain at this time.  Denies any shortness of breath.  Patient brought in by EMS who found her to be bradycardic from the 20s to the 40s.  However, her blood pressure was normal.  Patient not complaining of any lightheadedness.  Says that she has been compliant with her medications and denies any overdoses. Past Medical History:  Diagnosis Date  . Arrhythmia   . Atrial fibrillation ([place])   . CHF (congestive heart failure) ([place])   . Hearing loss   . Hypertension     Patient Active Problem List   Diagnosis Date Noted  . HTN (hypertension) 01/29/2017  . Hard of hearing 01/21/2017  . Atrial fibrillation with RVR ([place])   . Palliative care encounter   . CHF (congestive heart failure) ([place]) 12/31/2016    History reviewed. No pertinent surgical history.  Prior to Admission medications   Medication Sig Start Date End Date Taking? Authorizing Provider  acetaminophen (TYLENOL) 325 MG tablet Take 2 tablets (650 mg total) by mouth every 6 (six) hours as needed for mild pain (or Fever >/= 101). 01/07/17   Gouru, Illene Silver, MD  albuterol (PROVENTIL) (2.5 MG/3ML) 0.083% nebulizer solution Take 3 mLs (2.5 mg total) by nebulization  every 6 (six) hours as needed for wheezing or shortness of breath. 01/07/17   Gouru, Illene Silver, MD  carbamide peroxide (DEBROX) 6.5 % OTIC solution Place 4 drops into both ears 2 (two) times daily.    [provider]  dofetilide (TIKOSYN) 250 MCG capsule Take 250 mcg by mouth 2 (two) times daily.    [provider]  furosemide (LASIX) 40 MG tablet Take 40 mg by mouth 2 (two) times daily.    [provider]  guaiFENesin-dextromethorphan (ROBITUSSIN DM) 100-10 MG/5ML syrup Take 10 mLs by mouth every 6 (six) hours as needed for cough. 01/07/17   [name], MD  HYDROcodone-acetaminophen (NORCO/VICODIN) 5-325 MG tablet Take 1-2 tablets by mouth every 4 (four) hours as needed for moderate pain. 01/07/17   [name], MD  metoprolol succinate (TOPROL-XL) 100 MG 24 hr tablet Take 100 mg by mouth daily. 11/19/16   [provider]  potassium chloride SA (K-DUR,KLOR-CON) 20 MEQ tablet Take 20 mEq by mouth 2 (two) times daily.    [provider]  rivaroxaban (XARELTO) 20 MG TABS tablet Take 20 mg by mouth daily. 10/27/16   [provider]  senna-docusate (SENOKOT-S) 8.6-50 MG tablet Take 1 tablet by mouth at bedtime as needed for mild constipation. 01/07/17   [name], MD    Allergies Patient has no known allergies.  No family history on file.  Social History Social History   Tobacco Use  . Smoking status: Never Smoker  .  Smokeless tobacco: Never Used  Substance Use Topics  . Alcohol use: No  . Drug use: Not on file    Review of Systems  Constitutional: No fever/chills Eyes: No visual changes. ENT: No sore throat. Cardiovascular: Denies chest pain. Respiratory: Denies shortness of breath. Gastrointestinal: No abdominal pain.  No nausea, no vomiting.  No diarrhea.  No constipation. Genitourinary: Negative for dysuria. Musculoskeletal: Negative for back pain. Skin: Negative for rash. Neurological: Negative for headaches, focal weakness or  numbness.   ____________________________________________   PHYSICAL EXAM:  VITAL SIGNS: ED Triage Vitals  Enc Vitals Group     BP 02/20/17 1341 (!) 122/100     Pulse Rate 02/20/17 1341 (!) 44     Resp 02/20/17 1341 16     Temp 02/20/17 1341 97.9 F (36.6 C)     Temp Source 02/20/17 1341 Oral     SpO2 02/20/17 1341 97 %     Weight 02/20/17 1344 160 lb (72.6 kg)     Height --      Head Circumference --      Peak Flow --      Pain Score --      Pain Loc --      Pain Edu? --      Excl. in [place]? --     Constitutional: Alert and oriented. Well appearing and in no acute distress. Eyes: Conjunctivae are normal.  Head: Atraumatic. Nose: No congestion/rhinnorhea. Mouth/Throat: Mucous membranes are moist.  Neck: No stridor.   Cardiovascular: Bradycardic, regular rhythm. Grossly normal heart sounds.   Respiratory: Normal respiratory effort.  No retractions. Lungs CTAB. Gastrointestinal: Soft and nontender. No distention. Musculoskeletal: No lower extremity tenderness nor edema.  No joint effusions. Neurologic:  Normal speech and language. No gross focal neurologic deficits are appreciated. Skin:  Skin is warm, dry and intact. No rash noted. Psychiatric: Mood and affect are normal. Speech and behavior are normal.  ____________________________________________   LABS (all labs ordered are listed, but only abnormal results are displayed)  Labs Reviewed  CBC - Abnormal; Notable for the following components:      Result Value   RBC 3.54 (*)    Hemoglobin 9.6 (*)    HCT 30.6 (*)    MCHC 31.3 (*)    RDW 23.0 (*)    All other components within normal limits  BASIC METABOLIC PANEL  TROPONIN I   ____________________________________________  EKG  ED ECG REPORT I, Doran Stabler, the attending physician, personally viewed and interpreted this ECG.   Date: 02/20/2017  EKG Time: 1338  Rate: 26  Rhythm: sinus bradycardia versus ectopic atrial bradycardia possibly P waves in  the precordial leads.  Axis: Normal  Intervals:Prolonged PR interval  ST&T Change: No ST segment elevation or depression.  No abnormal T wave inversion.  Wandering baseline/regular baseline Y the EKG machine is reading ST elevation in these leads.  ____________________________________________  RADIOLOGY  Trace dependent bilateral pleural effusions.  Overall improved from last x-ray. ____________________________________________   PROCEDURES  Procedure(s) performed:   .Critical Care Performed by: Orbie Pyo, MD Authorized by: Orbie Pyo, MD   Critical care provider statement:    Critical care time (minutes):  35   Critical care was necessary to treat or prevent imminent or life-threatening deterioration of the following conditions:  Cardiac failure   Critical care was time spent personally by me on the following activities:  Ordering and performing treatments and interventions, ordering and review of laboratory studies, ordering and  review of radiographic studies, pulse oximetry, re-evaluation of patient's condition and examination of patient    Critical Care performed:    ____________________________________________   INITIAL IMPRESSION / ASSESSMENT AND PLAN / ED COURSE  Pertinent labs & imaging results that were available during my care of the patient were reviewed by me and considered in my medical decision making (see chart for details).  Differential diagnosis includes, but is not limited to, ACS, aortic dissection, pulmonary embolism, cardiac tamponade, pneumothorax, pneumonia, pericarditis, myocarditis, GI-related causes including esophagitis/gastritis, and musculoskeletal chest wall pain.   As part of my medical decision making, I reviewed the following data within the [facility] chart reviewed and Notes from prior ED visits      ----------------------------------------- 2:30 PM on  02/20/2017 -----------------------------------------  Patient continues to be bradycardic to the 20s and 30s but with a blood pressure from the high 90s to the low 100s.  She continues to be asymptomatic.  Unclear cause of the bradycardia at this time.  However, the patient will require further monitoring as an inpatient.  Signed out to Dr. Margaretmary Eddy.   ____________________________________________   FINAL CLINICAL IMPRESSION(S) / ED DIAGNOSES  Bradycardia     NEW MEDICATIONS STARTED DURING THIS VISIT:  New Prescriptions   No medications on file     Note:  This document was prepared using Dragon voice recognition software and may include unintentional dictation errors.     Orbie Pyo, MD 02/20/17 [phone]

## 2017-02-21 DIAGNOSIS — Z7901 Long term (current) use of anticoagulants: Secondary | ICD-10-CM | POA: Diagnosis not present

## 2017-02-21 DIAGNOSIS — R001 Bradycardia, unspecified: Secondary | ICD-10-CM | POA: Diagnosis present

## 2017-02-21 DIAGNOSIS — Z79899 Other long term (current) drug therapy: Secondary | ICD-10-CM | POA: Diagnosis not present

## 2017-02-21 DIAGNOSIS — N179 Acute kidney failure, unspecified: Secondary | ICD-10-CM | POA: Diagnosis present

## 2017-02-21 DIAGNOSIS — I130 Hypertensive heart and chronic kidney disease with heart failure and stage 1 through stage 4 chronic kidney disease, or unspecified chronic kidney disease: Secondary | ICD-10-CM | POA: Diagnosis present

## 2017-02-21 DIAGNOSIS — I5032 Chronic diastolic (congestive) heart failure: Secondary | ICD-10-CM | POA: Diagnosis present

## 2017-02-21 DIAGNOSIS — N183 Chronic kidney disease, stage 3 (moderate): Secondary | ICD-10-CM | POA: Diagnosis present

## 2017-02-21 DIAGNOSIS — Z66 Do not resuscitate: Secondary | ICD-10-CM | POA: Diagnosis present

## 2017-02-21 DIAGNOSIS — I482 Chronic atrial fibrillation: Secondary | ICD-10-CM | POA: Diagnosis present

## 2017-02-21 DIAGNOSIS — G4700 Insomnia, unspecified: Secondary | ICD-10-CM | POA: Diagnosis present

## 2017-02-21 DIAGNOSIS — I959 Hypotension, unspecified: Secondary | ICD-10-CM | POA: Diagnosis present

## 2017-02-21 DIAGNOSIS — Z515 Encounter for palliative care: Secondary | ICD-10-CM | POA: Diagnosis present

## 2017-02-21 DIAGNOSIS — H9190 Unspecified hearing loss, unspecified ear: Secondary | ICD-10-CM | POA: Diagnosis present

## 2017-02-21 DIAGNOSIS — D649 Anemia, unspecified: Secondary | ICD-10-CM | POA: Diagnosis present

## 2017-02-21 LAB — BASIC METABOLIC PANEL
Anion gap: 9 (ref 5–15)
BUN: 44 mg/dL — ABNORMAL HIGH (ref 6–20)
CHLORIDE: 107 mmol/L (ref 101–111)
CO2: 23 mmol/L (ref 22–32)
CREATININE: 2.79 mg/dL — AB (ref 0.44–1.00)
Calcium: 8.4 mg/dL — ABNORMAL LOW (ref 8.9–10.3)
GFR calc non Af Amer: 14 mL/min — ABNORMAL LOW (ref >60)
GFR, EST AFRICAN AMERICAN: 17 mL/min — AB (ref >60)
Glucose, Bld: 113 mg/dL — ABNORMAL HIGH (ref 65–99)
POTASSIUM: 5 mmol/L (ref 3.5–5.1)
Sodium: 139 mmol/L (ref 135–145)

## 2017-02-21 MED ORDER — SODIUM CHLORIDE 0.9 % IV SOLN
INTRAVENOUS | Status: DC
  Administered 2017-02-21 – 2017-02-22 (×3): via INTRAVENOUS

## 2017-02-21 MED ORDER — TRAZODONE HCL 50 MG PO TABS
50.0000 mg | ORAL_TABLET | Freq: Every day | ORAL | Status: DC
Start: 2017-02-21 — End: 2017-02-22
  Administered 2017-02-21: 50 mg via ORAL
  Filled 2017-02-21: qty 1

## 2017-02-21 NOTE — Progress Notes (Signed)
Pt. Has catnapped throughout the night with no c/o pain, SOB or acute distress observed.

## 2017-02-21 NOTE — Progress Notes (Signed)
Pt's Tikosyn on hold at this time related to pharmacy verifying the medication with the cardiologist.

## 2017-02-21 NOTE — Progress Notes (Signed)
Patient ID: Mercedes Rodriguez, female   DOB: [birth year], 84 y.o.   MRN: [id number]  [facility] PROGRESS NOTE  SHAELY GADBERRY MGQ:[id number] DOB: 1929-01-25 DOA: 02/20/2017 PCP: Lynnell Jude, MD  HPI/Subjective: Patient feeling okay.  She states that she did not feel well yesterday.  As per the nurse she was a little confused today.  She did not sleep well last night.   Objective: Vitals:   02/20/17 1946 02/21/17 0405  BP: (!) 98/43 134/88  Pulse: 77 (!) 45  Resp: 16 18  Temp: (!) 97.5 F (36.4 C) 97.9 F (36.6 C)  SpO2: 97% 94%    Filed Weights   02/20/17 1344 02/21/17 0246  Weight: 72.6 kg (160 lb) 73.3 kg (161 lb 8 oz)    ROS: Review of Systems  Constitutional: Negative for chills and fever.  Eyes: Negative for blurred vision.  Respiratory: Negative for cough and shortness of breath.   Cardiovascular: Negative for chest pain.  Gastrointestinal: Negative for abdominal pain, constipation, diarrhea, nausea and vomiting.  Genitourinary: Negative for dysuria.  Musculoskeletal: Negative for joint pain.  Neurological: Negative for dizziness and headaches.   Exam: Physical Exam  HENT:  Nose: No mucosal edema.  Mouth/Throat: No oropharyngeal exudate or posterior oropharyngeal edema.  Eyes: Conjunctivae, EOM and lids are normal. Pupils are equal, round, and reactive to light.  Neck: No JVD present. Carotid bruit is not present. No edema present. No thyroid mass and no thyromegaly present.  Cardiovascular: S1 normal and S2 normal. Exam reveals no gallop.  No murmur heard. Pulses:      Dorsalis pedis pulses are 2+ on the right side, and 2+ on the left side.  Respiratory: No respiratory distress. She has no wheezes. She has no rhonchi. She has no rales.  GI: Soft. Bowel sounds are normal. There is no tenderness.  Musculoskeletal:       Right ankle: She exhibits no swelling.       Left ankle: She exhibits no swelling.  Lymphadenopathy:    She has no cervical adenopathy.   Neurological: She is alert. No cranial nerve deficit.  Skin: Skin is warm. No rash noted. Nails show no clubbing.  Psychiatric: She has a normal mood and affect.      Data Reviewed: Basic Metabolic Panel: Recent Labs  Lab 02/20/17 1344 02/21/17 1247  NA 139 139  K 4.9 5.0  CL 109 107  CO2 24 23  GLUCOSE 106* 113*  BUN 39* 44*  CREATININE 2.88* 2.79*  CALCIUM 8.5* 8.4*   CBC: Recent Labs  Lab 02/20/17 1344  WBC 7.7  HGB 9.6*  HCT 30.6*  MCV 86.3  PLT 153   Cardiac Enzymes: Recent Labs  Lab 02/20/17 1344 02/20/17 1634 02/20/17 2057  TROPONINI <0.03 <0.03 <0.03   BNP (last 3 results) Recent Labs    12/31/16 0915  BNP 1,058.0*      Studies: Dg Chest Portable 1 View  Result Date: 02/20/2017 CLINICAL DATA:  Chest pain EXAM: PORTABLE CHEST 1 VIEW COMPARISON:  01/03/2017 chest radiograph. FINDINGS: Pacer pad overlies the left chest. Partially visualized surgical hardware overlying the lower cervical spine. Stable cardiomediastinal silhouette with mild cardiomegaly. No pneumothorax. Trace dependent bilateral pleural effusions, stable. Mild pulmonary edema, decreased. IMPRESSION: Mild congestive heart failure, improved. Trace dependent bilateral pleural effusions, stable. Electronically Signed   By: Ilona Sorrel M.D.   On: 02/20/2017 14:06    Scheduled Meds: . atropine  1 mg Intravenous Once  . carbamide peroxide  4 drop Both EARS BID  . potassium chloride SA  20 mEq Oral BID  . rivaroxaban  15 mg Oral Q supper  . sodium chloride flush  3 mL Intravenous Q12H  . traZODone  50 mg Oral QHS   Continuous Infusions: . sodium chloride 50 mL/hr at 02/21/17 1341    Assessment/Plan:  1. Acute kidney injury.  Stop Lasix and give gentle IV fluid hydration.   2. bradycardia.  Heart rate in the 20s and 30s overnight.  3 medications that slow heart rate are being held. 3. Chronic atrial fibrillation on Xarelto 4. Chronic diastolic congestive heart failure.  Hold Lasix  with acute kidney injury.  Watch closely with gentle IV fluid hydration 5. Insomnia.  Start low-dose trazodone  Code Status:     Code Status Orders  (From admission, onward)        Start     Ordered   02/20/17 1625  Do not attempt resuscitation (DNR)  Continuous    Question Answer Comment  In the event of cardiac or respiratory ARREST Do not call a "code blue"   In the event of cardiac or respiratory ARREST Do not perform Intubation, CPR, defibrillation or ACLS   In the event of cardiac or respiratory ARREST Use medication by any route, position, wound care, and other measures to relive pain and suffering. May use oxygen, suction and manual treatment of airway obstruction as needed for comfort.   Comments RN may pronounce      02/20/17 1625    Code Status History    Date Active Date Inactive Code Status Order ID Comments User Context   12/31/2016 12:06 01/08/2017 17:50 DNR [id number]  Bettey Costa, MD Inpatient     Family Communication: sister at the bedside  Disposition Plan: To be determined  Consultants:  Cardiology  Time spent: 28 minutes  [place]

## 2017-02-21 NOTE — Consult Note (Signed)
[facility] Consultation Note  Patient ID: Mercedes Rodriguez, MRN: [id number], DOB/AGE: 82-Mar-1931 82 y.o. Admit date: 02/20/2017   Date of Consult: 02/21/2017 Primary Physician: Lynnell Jude, MD Primary Cardiologist: [facility]  Chief Complaint:  Chief Complaint  Patient presents with  . Back Pain   Reason for Consult: Bradycardia  HPI: 84 y.o. female with known diastolic dysfunction heart failure with paroxysmal nonvalvular atrial fibrillation and hypertension for which the patient has previously been on Tikosin and amiodarone.  It is unclear why the patient is on both medications at this time.  The patient apparently has been confused and is in hospice care based on previous history.  The patient has had worsening atypical chest discomfort without evidence of myocardial infarction but does have some mild congestive heart failure consistent with acute on chronic diastolic dysfunction heart failure.  The patient has had incidental significant sinus bradycardia likely secondary to multiple medications and higher dosages of amiodarone.  The patient's heart rate is now improved at 50 bpm.  She also has significant acute renal failure on chronic renal failure with a previous glomerular filtration rate of 48 now up to 18.  With this change the patient may have changes in plasma volume of specific medications and including antiarrhythmics.  Due to this change the patient will need discontinuation of these medications.  Additionally there is concerns of anticoagulation with this low glomerular filtration rate and therefore will follow very closely for need for adjustments.  Currently the patient is not alert and oriented unable to give any further history but is not in any distress  Past Medical History:  Diagnosis Date  . Arrhythmia   . Atrial fibrillation ([place])   . CHF (congestive heart failure) ([place])   . Hearing loss   . Hypertension       Surgical History: History reviewed. No pertinent  surgical history.   Home Meds: Prior to Admission medications   Medication Sig Start Date End Date Taking? Authorizing Provider  acetaminophen (TYLENOL) 650 MG suppository Place 650 mg rectally every 4 (four) hours as needed.   Yes [provider]  albuterol (PROVENTIL) (2.5 MG/3ML) 0.083% nebulizer solution Take 3 mLs (2.5 mg total) by nebulization every 6 (six) hours as needed for wheezing or shortness of breath. 01/07/17  Yes Gouru, Illene Silver, MD  amiodarone (PACERONE) 200 MG tablet Take 400 mg by mouth 2 (two) times daily.   Yes [provider]  carbamide peroxide (DEBROX) 6.5 % OTIC solution Place 4 drops into both ears 2 (two) times daily.   Yes [provider]  dofetilide (TIKOSYN) 250 MCG capsule Take 250 mcg by mouth 2 (two) times daily.   Yes [provider]  furosemide (LASIX) 40 MG tablet Take 40 mg by mouth 2 (two) times daily.   Yes [provider]  guaiFENesin-dextromethorphan (ROBITUSSIN DM) 100-10 MG/5ML syrup Take 10 mLs by mouth every 6 (six) hours as needed for cough. 01/07/17  Yes Gouru, Illene Silver, MD  haloperidol (HALDOL) 1 MG tablet Take 1 mg by mouth every 6 (six) hours as needed for agitation.   Yes [provider]  hyoscyamine (LEVSIN, ANASPAZ) 0.125 MG tablet Take 0.125 mg by mouth. Every 4 to 6 hours as needed [secretions]   Yes [provider]  LORazepam (ATIVAN) 0.5 MG tablet Take 0.5 mg by mouth every 4 (four) hours as needed for anxiety.   Yes [provider]  metoprolol succinate (TOPROL-XL) 100 MG 24 hr tablet Take 100 mg by  mouth daily. 11/19/16  Yes [provider]  Morphine Sulfate (MORPHINE CONCENTRATE) 10 mg / 0.5 ml concentrated solution Take 5 mg by mouth every hour as needed for severe pain or shortness of breath.   Yes [provider]  potassium chloride SA (K-DUR,KLOR-CON) 20 MEQ tablet Take 20 mEq by mouth 2 (two) times daily.   Yes [provider]  rivaroxaban  (XARELTO) 20 MG TABS tablet Take 20 mg by mouth daily. 10/27/16  Yes [provider]  senna-docusate (SENOKOT-S) 8.6-50 MG tablet Take 1 tablet by mouth at bedtime as needed for mild constipation. 01/07/17  Yes Gouru, Illene Silver, MD  acetaminophen (TYLENOL) 325 MG tablet Take 2 tablets (650 mg total) by mouth every 6 (six) hours as needed for mild pain (or Fever >/= 101). Patient not taking: Reported on 02/20/2017 01/07/17   [name], MD  HYDROcodone-acetaminophen (NORCO/VICODIN) 5-325 MG tablet Take 1-2 tablets by mouth every 4 (four) hours as needed for moderate pain. Patient not taking: Reported on 02/20/2017 01/07/17   [name], MD    Inpatient Medications:  . atropine  1 mg Intravenous Once  . carbamide peroxide  4 drop Both EARS BID  . furosemide  40 mg Oral BID  . potassium chloride SA  20 mEq Oral BID  . rivaroxaban  15 mg Oral Q supper  . sodium chloride flush  3 mL Intravenous Q12H     Allergies: No Known Allergies  Social History   Socioeconomic History  . Marital status: Widowed    Spouse name: Not on file  . Number of children: Not on file  . Years of education: Not on file  . Highest education level: Not on file  Social Needs  . Financial resource strain: Not on file  . Food insecurity - worry: Not on file  . Food insecurity - inability: Not on file  . Transportation needs - medical: Not on file  . Transportation needs - non-medical: Not on file  Occupational History  . Not on file  Tobacco Use  . Smoking status: Never Smoker  . Smokeless tobacco: Never Used  Substance and Sexual Activity  . Alcohol use: No  . Drug use: Not on file  . Sexual activity: Not on file  Other Topics Concern  . Not on file  Social History Narrative  . Not on file     No family history on file.   Review of Systems Cannot assess due to disorientation ve.  Labs: Recent Labs    02/20/17 1344 02/20/17 1634 02/20/17 2057  TROPONINI <0.03 <0.03 <0.03   Lab Results   Component Value Date   WBC 7.7 02/20/2017   HGB 9.6 (L) 02/20/2017   HCT 30.6 (L) 02/20/2017   MCV 86.3 02/20/2017   PLT 153 02/20/2017    Recent Labs  Lab 02/20/17 1344  NA 139  K 4.9  CL 109  CO2 24  BUN 39*  CREATININE 2.88*  CALCIUM 8.5*  GLUCOSE 106*   No results found for: CHOL, HDL, LDLCALC, TRIG No results found for: DDIMER  Radiology/Studies:  Dg Chest Portable 1 View  Result Date: 02/20/2017 CLINICAL DATA:  Chest pain EXAM: PORTABLE CHEST 1 VIEW COMPARISON:  01/03/2017 chest radiograph. FINDINGS: Pacer pad overlies the left chest. Partially visualized surgical hardware overlying the lower cervical spine. Stable cardiomediastinal silhouette with mild cardiomegaly. No pneumothorax. Trace dependent bilateral pleural effusions, stable. Mild pulmonary edema, decreased. IMPRESSION: Mild congestive heart failure, improved. Trace dependent bilateral pleural effusions, stable. Electronically  Signed   By: Ilona Sorrel M.D.   On: 02/20/2017 14:06    EKG: Sinus bradycardia at 40-50 bpm  Weights: Filed Weights   02/20/17 1344 02/21/17 0246  Weight: 160 lb (72.6 kg) 161 lb 8 oz (73.3 kg)     Physical Exam: Blood pressure 134/88, pulse (!) 45, temperature 97.9 F (36.6 C), temperature source Oral, resp. rate 18, weight 161 lb 8 oz (73.3 kg), SpO2 94 %. Body mass index is 27.72 kg/m. General: Well developed, well nourished, in no acute distress. Head eyes ears nose throat: Normocephalic, atraumatic, sclera non-icteric, no xanthomas, nares are without discharge. No apparent thyromegaly and/or mass  Lungs: Normal respiratory effort.  Some wheezes, few rales, no rhonchi.  Heart: RRR with normal S1 S2. no murmur gallop, no rub, PMI is normal size and placement, carotid upstroke normal without bruit, jugular venous pressure is normal Abdomen: Soft, non-tender, non-distended with normoactive bowel sounds. No hepatomegaly. No rebound/guarding. No obvious abdominal masses. Abdominal  aorta is normal size without bruit Extremities: Trace edema. no cyanosis, no clubbing, no ulcers  Peripheral : 2+ bilateral upper extremity pulses, 2+ bilateral femoral pulses, 2+ bilateral dorsal pedal pulse Neuro: Not alert and oriented. No facial asymmetry. No focal deficit. Moves all extremities spontaneously. Musculoskeletal: Normal muscle tone without kyphosis Psych: Does not responds to questions appropriately with a normal affect.    Assessment: 84 year old female with acute on chronic diastolic dysfunction congestive heart failure acute renal failure and sinus bradycardia with history of paroxysmal atrial fibrillation on multiple other medication management without evidence of myocardial infarction and further distress  Plan: 1.  Discontinue T ikosyn due to low glomerular filtration rate and contraindications at this level with possible side effects 2.  Discontinuation of amiodarone due to significant bradycardia but possible reinstatement at 200 mg when bradycardia improves 3.  Follow glomerular filtration rate and possible discontinuation or adjustments of medication management for anticoagulation with Xarelto 4.  Consideration of echocardiogram for further acute on chronic diastolic dysfunction heart failure and renal failure 5.  Abstain from metoprolol for hypertension control and concerns of bradycardia 6.  Begin ambulation and follow for improvements of symptoms Signed, Corey Skains M.D. [facility] 02/21/2017, 6:27 AM

## 2017-02-21 NOTE — Progress Notes (Signed)
Pt. HR has ran in 30's and 40's throughout the night.

## 2017-02-22 LAB — BASIC METABOLIC PANEL
Anion gap: 8 (ref 5–15)
BUN: 42 mg/dL — AB (ref 6–20)
CHLORIDE: 112 mmol/L — AB (ref 101–111)
CO2: 21 mmol/L — AB (ref 22–32)
Calcium: 8.2 mg/dL — ABNORMAL LOW (ref 8.9–10.3)
Creatinine, Ser: 2.12 mg/dL — ABNORMAL HIGH (ref 0.44–1.00)
GFR calc non Af Amer: 20 mL/min — ABNORMAL LOW (ref >60)
GFR, EST AFRICAN AMERICAN: 23 mL/min — AB (ref >60)
Glucose, Bld: 90 mg/dL (ref 65–99)
POTASSIUM: 4.1 mmol/L (ref 3.5–5.1)
Sodium: 141 mmol/L (ref 135–145)

## 2017-02-22 LAB — URINALYSIS, COMPLETE (UACMP) WITH MICROSCOPIC
BACTERIA UA: NONE SEEN
BILIRUBIN URINE: NEGATIVE
Glucose, UA: NEGATIVE mg/dL
HGB URINE DIPSTICK: NEGATIVE
Ketones, ur: NEGATIVE mg/dL
LEUKOCYTES UA: NEGATIVE
NITRITE: NEGATIVE
PH: 5 (ref 5.0–8.0)
Protein, ur: NEGATIVE mg/dL
SPECIFIC GRAVITY, URINE: 1.011 (ref 1.005–1.030)

## 2017-02-22 MED ORDER — ENSURE ENLIVE PO LIQD: 237.0000 mL | Freq: Three times a day (TID) | ORAL | Status: DC

## 2017-02-22 MED ORDER — TRAZODONE HCL 100 MG PO TABS
100.0000 mg | ORAL_TABLET | Freq: Every day | ORAL | Status: DC
  Administered 2017-02-22: 100 mg via ORAL
  Filled 2017-02-22: qty 1

## 2017-02-22 NOTE — Evaluation (Signed)
Physical Therapy Evaluation Patient Details Name: Mercedes Rodriguez MRN: [id number] DOB: September 15, 1929 Today's Date: 02/22/2017   History of Present Illness  Patient is a pleasant 83 y/o female admitted for acute kidney injury, bradycardia (HR 29-30 bpm with chest pain).   Clinical Impression  Patient is a pleasant 84 y/o female admitted with chest pain/bradycardia (HR in the 29-30 range). She has been independent prior to this admission with a RW. She demonstrated HR in the 70s throughout ambulation this date with RW, and was able to ambulate household distance with no loss of balance or complication noted. She appears to be close to her physical baseline as she can transfer sit to stand and perform bed mobility with no assistance required. She would likely benefit from HHPT to address ongoing cardiopulmonary disease and it's impact on her mobility around her household environment.     Follow Up Recommendations Home health PT    Equipment Recommendations  Rolling walker with 5" wheels    Recommendations for Other Services       Precautions / Restrictions Precautions Precautions: Fall Restrictions Weight Bearing Restrictions: No      Mobility  Bed Mobility Overal bed mobility: Independent             General bed mobility comments: Decreased speed in transfer, but no physical assistance required.   Transfers Overall transfer level: Modified independent Equipment used: Rolling walker (2 wheeled)             General transfer comment: Patient is able to complete transfer swiftly, with no deficits observed.   Ambulation/Gait Ambulation/Gait assistance: Modified independent (Device/Increase time) Ambulation Distance (Feet): 125 Feet Assistive device: Rolling walker (2 wheeled) Gait Pattern/deviations: WFL(Within Functional Limits)   Gait velocity interpretation: <1.8 ft/sec, indicative of risk for recurrent falls General Gait Details: HR maintained in the 70s with no loss  of balance or shortness of breath. Appropriate gait pattern for her age with RW.   Stairs            Wheelchair Mobility    Modified Rankin (Stroke Patients Only)       Balance Overall balance assessment: Modified Independent                                           Pertinent Vitals/Pain Pain Assessment: Faces Faces Pain Scale: Hurts a little bit Pain Location: L low back  Pain Descriptors / Indicators: Aching Pain Intervention(s): Limited activity within patient's tolerance;Monitored during session;Repositioned    Home Living Family/patient expects to be discharged to:: Private residence Living Arrangements: Children Available Help at Discharge: Family;Available PRN/intermittently Type of Home: House Home Access: Stairs to enter Entrance Stairs-Rails: Can reach both Entrance Stairs-Number of Steps: 2 Home Layout: Able to live on main level with bedroom/bathroom;Two level Home Equipment: Walker - 2 wheels;Bedside commode      Prior Function Level of Independence: Independent with assistive device(s)         Comments: Reports using RW around the house      Hand Dominance        Extremity/Trunk Assessment   Upper Extremity Assessment Upper Extremity Assessment: Overall WFL for tasks assessed    Lower Extremity Assessment Lower Extremity Assessment: Overall WFL for tasks assessed       Communication   Communication: No difficulties  Cognition Arousal/Alertness: Awake/alert Behavior During Therapy: WFL for tasks assessed/performed Overall Cognitive Status:  Within Functional Limits for tasks assessed                                        General Comments      Exercises     Assessment/Plan    PT Assessment Patient needs continued PT services  PT Problem List Decreased strength;Decreased mobility;Cardiopulmonary status limiting activity;Decreased activity tolerance;Decreased balance       PT Treatment  Interventions DME instruction;Therapeutic activities;Gait training;Therapeutic exercise;Patient/family education;Neuromuscular re-education;Balance training;Stair training    PT Goals (Current goals can be found in the Care Plan section)  Acute Rehab PT Goals Patient Stated Goal: To return home safely  PT Goal Formulation: With patient Time For Goal Achievement: 03/08/17 Potential to Achieve Goals: Good    Frequency Min 2X/week   Barriers to discharge        Co-evaluation               AM-PAC PT "6 Clicks" Daily Activity  Outcome Measure Difficulty turning over in bed (including adjusting bedclothes, sheets and blankets)?: None Difficulty moving from lying on back to sitting on the side of the bed? : None Difficulty sitting down on and standing up from a chair with arms (e.g., wheelchair, bedside commode, etc,.)?: None Help needed moving to and from a bed to chair (including a wheelchair)?: None Help needed walking in hospital room?: None Help needed climbing 3-5 steps with a railing? : A Little 6 Click Score: 23    End of Session Equipment Utilized During Treatment: Gait belt Activity Tolerance: Patient tolerated treatment well Patient left: with call bell/phone within reach;in chair;with chair alarm set Nurse Communication: Mobility status PT Visit Diagnosis: Difficulty in walking, not elsewhere classified (R26.2)    Time: 5329-9242 PT Time Calculation (min) (ACUTE ONLY): 13 min   Charges:   PT Evaluation $PT Eval Moderate Complexity: 1 Mod     PT G Codes:   PT G-Codes **NOT FOR INPATIENT CLASS** Functional Assessment Tool Used: AM-PAC 6 Clicks Basic Mobility   Royce Macadamia PT, DPT, CSCS    02/22/2017, 3:10 PM

## 2017-02-22 NOTE — Progress Notes (Signed)
[facility] Encounter Note  Patient: Mercedes Rodriguez / Admit Date: 02/20/2017 / Date of Encounter: 02/22/2017, 6:29 AM   Subjective: Patient slept well last night.  No evidence of significant symptoms overnight.  Heart rate improving with average heart rate of of 60 bpm.  Patient remains in normal sinus heart failure  Review of Systems: Positive for: Weakness Negative for: Vision change, hearing change, syncope, dizziness, nausea, vomiting,diarrhea, bloody stool, stomach pain, cough, congestion, diaphoresis, urinary frequency, urinary pain,skin lesions, skin rashes Others previously listed  Objective: Telemetry: Sinus bradycardia Physical Exam: Blood pressure 136/86, pulse (!) 49, temperature 98.3 F (36.8 C), resp. rate 16, weight 161 lb 8 oz (73.3 kg), SpO2 97 %. Body mass index is 27.72 kg/m. General: Well developed, well nourished, in no acute distress. Head: Normocephalic, atraumatic, sclera non-icteric, no xanthomas, nares are without discharge. Neck: No apparent masses Lungs: Normal respirations with no wheezes, no rhonchi, no rales , no crackles   Heart: Regular rate and rhythm, normal S1 S2, 2+ left upper sternal border murmur, no rub, no gallop, PMI is normal size and placement, carotid upstroke normal without bruit, jugular venous pressure normal Abdomen: Soft, non-tender, non-distended with normoactive bowel sounds. No hepatosplenomegaly. Abdominal aorta is normal size without bruit Extremities: No edema, no clubbing, no cyanosis, no ulcers,  Peripheral: 2+ radial, 2+ femoral, 2+ dorsal pedal pulses Neuro: Alert and oriented. Moves all extremities spontaneously. Psych:  Responds to questions appropriately with a normal affect.   Intake/Output Summary (Last 24 hours) at 02/22/2017 0629 Last data filed at 02/22/2017 0333 Gross per 24 hour  Intake 921.66 ml  Output 1050 ml  Net -128.34 ml    Inpatient Medications:  . atropine  1 mg Intravenous Once   . carbamide peroxide  4 drop Both EARS BID  . rivaroxaban  15 mg Oral Q supper  . sodium chloride flush  3 mL Intravenous Q12H  . traZODone  50 mg Oral QHS   Infusions:  . sodium chloride 50 mL/hr at 02/22/17 0333    Labs: Recent Labs    02/20/17 1344 02/21/17 1247  NA 139 139  K 4.9 5.0  CL 109 107  CO2 24 23  GLUCOSE 106* 113*  BUN 39* 44*  CREATININE 2.88* 2.79*  CALCIUM 8.5* 8.4*   No results for input(s): AST, ALT, ALKPHOS, BILITOT, PROT, ALBUMIN in the last 72 hours. Recent Labs    02/20/17 1344  WBC 7.7  HGB 9.6*  HCT 30.6*  MCV 86.3  PLT 153   Recent Labs    02/20/17 1344 02/20/17 1634 02/20/17 2057  TROPONINI <0.03 <0.03 <0.03   Invalid input(s): POCBNP No results for input(s): HGBA1C in the last 72 hours.   Weights: Filed Weights   02/20/17 1344 02/21/17 0246  Weight: 160 lb (72.6 kg) 161 lb 8 oz (73.3 kg)     Radiology/Studies:  Dg Chest Portable 1 View  Result Date: 02/20/2017 CLINICAL DATA:  Chest pain EXAM: PORTABLE CHEST 1 VIEW COMPARISON:  01/03/2017 chest radiograph. FINDINGS: Pacer pad overlies the left chest. Partially visualized surgical hardware overlying the lower cervical spine. Stable cardiomediastinal silhouette with mild cardiomegaly. No pneumothorax. Trace dependent bilateral pleural effusions, stable. Mild pulmonary edema, decreased. IMPRESSION: Mild congestive heart failure, improved. Trace dependent bilateral pleural effusions, stable. Electronically Signed   By: Ilona Sorrel M.D.   On: 02/20/2017 14:06     Assessment and Recommendation  84 y.o. female with essential hypertension mixed hyperlipidemia with acute on chronic renal  failure and paroxysmal nonvalvular atrial fibrillation on multiple medication management which cause symptomatic bradycardia now slowly improving without evidence of myocardial infarction or congestive heart failure 1.  Continue to abstain from high dose amiodarone beta-blocker and tikosin. 2.  Possible  reinstatement of low-dose amiodarone either at end of hospitalization or as as necessary for maintenance of normal sinus rhythm 3.  Creatinine adjusted anticoagulation for further risk reduction of stroke with atrial fibrillation 4.  Begin ambulation and follow for improvements of symptoms and begin rehabilitation 5.  If ambulating well with no evidence of significant further bradycardia through tomorrow with possible discharge to home or rehab facility of with follow-up next week  Signed, Serafina Royals M.D. FACC

## 2017-02-22 NOTE — Progress Notes (Signed)
Nutrition Brief Note  Patient identified on the Malnutrition Screening Tool (MST) Report  Wt Readings from Last 15 Encounters:  02/21/17 161 lb 8 oz (73.3 kg)  01/29/17 160 lb 4 oz (72.7 kg)  01/21/17 170 lb 8 oz (77.3 kg)  01/08/17 162 lb (73.5 kg)  02/09/16 170 lb (77.1 kg)   Met with patient at bedside. She reports her appetite is good and she is eating well. She reports eating 100% of three meals daily. Confirmed in chart patient is finishing 95-100% of all meals. She reports she had a good appetite PTA and always eats 3 meals daily. She follows a salt restriction at home in setting of CHF and CKD stage IV. No questions on diet. UBW 162-170 lbs depending on fluid status. Patient reports she does not drink any oral nutrition supplements since she eats so well. No nutrition questions or concerns at this time.  Patient does not meet criteria for malnutrition at this time.  Nutrition-Focused Physical Exam:    Most Recent Value  Orbital Region  No depletion  Upper Arm Region  Mild depletion  Thoracic and Lumbar Region  No depletion  Buccal Region  No depletion  Temple Region  Mild depletion  Clavicle Bone Region  No depletion  Clavicle and Acromion Bone Region  No depletion  Scapular Bone Region  No depletion  Dorsal Hand  Mild depletion  Patellar Region  Unable to assess  Anterior Thigh Region  Unable to assess  Posterior Calf Region  Unable to assess  Edema (RD Assessment)  Unable to assess  Hair  Reviewed  Eyes  Reviewed  Mouth  Reviewed  Skin  Reviewed  Nails  Reviewed      Body mass index is 27.72 kg/m. Patient meets criteria for normal weight based on current BMI.   Current diet order is Heart Healthy, patient is consuming approximately 95-100% of meals at this time. Labs and medications reviewed.   No nutrition interventions warranted at this time. If nutrition issues arise, please consult RD.   Willey Blade, MS, Joy, LDN Office: [phone] Pager:  [phone] After Hours/Weekend Pager: [phone]

## 2017-02-22 NOTE — Progress Notes (Signed)
Patient ID: Mercedes Rodriguez, female   DOB: [birth year], 84 y.o.   MRN: [id number]  [facility] PROGRESS NOTE  KEAMBER MACFADDEN DUK:[id number] DOB: 16-Mar-1929 DOA: 02/20/2017 PCP: Lynnell Jude, MD  HPI/Subjective: Patient feeling better.  Offers no complaints.  Heart rate over night still on the lower side.  Patient did not sleep very well last night.  Periods of confusion.   Objective: Vitals:   02/22/17 0800 02/22/17 1501  BP: (!) 152/69   Pulse: (!) 51 75  Resp: 16   Temp: 98 F (36.7 C)   SpO2: 99%     Filed Weights   02/20/17 1344 02/21/17 0246  Weight: 72.6 kg (160 lb) 73.3 kg (161 lb 8 oz)    ROS: Review of Systems  Constitutional: Negative for chills and fever.  Eyes: Negative for blurred vision.  Respiratory: Negative for cough and shortness of breath.   Cardiovascular: Negative for chest pain.  Gastrointestinal: Negative for abdominal pain, constipation, diarrhea, nausea and vomiting.  Genitourinary: Negative for dysuria.  Musculoskeletal: Negative for joint pain.  Neurological: Negative for dizziness and headaches.   Exam: Physical Exam  HENT:  Nose: No mucosal edema.  Mouth/Throat: No oropharyngeal exudate or posterior oropharyngeal edema.  Eyes: Conjunctivae, EOM and lids are normal. Pupils are equal, round, and reactive to light.  Neck: No JVD present. Carotid bruit is not present. No edema present. No thyroid mass and no thyromegaly present.  Cardiovascular: S1 normal and S2 normal. Exam reveals no gallop.  No murmur heard. Pulses:      Dorsalis pedis pulses are 2+ on the right side, and 2+ on the left side.  Respiratory: No respiratory distress. She has decreased breath sounds in the right lower field and the left lower field. She has no wheezes. She has no rhonchi. She has no rales.  GI: Soft. Bowel sounds are normal. There is no tenderness.  Musculoskeletal:       Right ankle: She exhibits no swelling.       Left ankle: She exhibits no swelling.   Lymphadenopathy:    She has no cervical adenopathy.  Neurological: She is alert. No cranial nerve deficit.  Skin: Skin is warm. No rash noted. Nails show no clubbing.  Psychiatric: She has a normal mood and affect.      Data Reviewed: Basic Metabolic Panel: Recent Labs  Lab 02/20/17 1344 02/21/17 1247 02/22/17 0728  NA 139 139 141  K 4.9 5.0 4.1  CL 109 107 112*  CO2 24 23 21*  GLUCOSE 106* 113* 90  BUN 39* 44* 42*  CREATININE 2.88* 2.79* 2.12*  CALCIUM 8.5* 8.4* 8.2*   CBC: Recent Labs  Lab 02/20/17 1344  WBC 7.7  HGB 9.6*  HCT 30.6*  MCV 86.3  PLT 153   Cardiac Enzymes: Recent Labs  Lab 02/20/17 1344 02/20/17 1634 02/20/17 2057  TROPONINI <0.03 <0.03 <0.03   BNP (last 3 results) Recent Labs    12/31/16 0915  BNP 1,058.0*       Scheduled Meds: . atropine  1 mg Intravenous Once  . carbamide peroxide  4 drop Both EARS BID  . rivaroxaban  15 mg Oral Q supper  . sodium chloride flush  3 mL Intravenous Q12H  . traZODone  100 mg Oral QHS   Continuous Infusions: . sodium chloride 30 mL/hr at 02/22/17 1324    Assessment/Plan:  1. Acute kidney injury.  Stop Lasix and give gentle IV fluid hydration.  Creatinine improved with IV fluids.  Continue IV fluids 1 more day. 2. bradycardia.  3 medications that slow heart rate are being held.  Last heart rate recorded at 75.  In the 50s prior to that. 3. Chronic atrial fibrillation on Xarelto 4. Chronic diastolic congestive heart failure.  Hold Lasix with acute kidney injury.  Watch closely with gentle IV fluid hydration 5. Insomnia.  Increase dose of trazodone  Code Status:     Code Status Orders  (From admission, onward)        Start     Ordered   02/20/17 1625  Do not attempt resuscitation (DNR)  Continuous    Question Answer Comment  In the event of cardiac or respiratory ARREST Do not call a "code blue"   In the event of cardiac or respiratory ARREST Do not perform Intubation, CPR,  defibrillation or ACLS   In the event of cardiac or respiratory ARREST Use medication by any route, position, wound care, and other measures to relive pain and suffering. May use oxygen, suction and manual treatment of airway obstruction as needed for comfort.   Comments RN may pronounce      02/20/17 1625    Code Status History    Date Active Date Inactive Code Status Order ID Comments User Context   12/31/2016 12:06 01/08/2017 17:50 DNR [id number]  Bettey Costa, MD Inpatient     Family Communication: Permission to speak in front of her friend at the bedside Disposition Plan: Home with home health potentially tomorrow or the following day depending on heart rate and whether we restart medications.  Consultants:  Cardiology  Time spent: 25 minutes  [place]

## 2017-02-22 NOTE — Progress Notes (Signed)
Pt. Slept better tonight than Friday night, she had longer increments of sleep throughout the night. HR ran in 40's to mid 50's throughout the night. No c/o pain, SOB or acute distress noted.

## 2017-02-23 LAB — BASIC METABOLIC PANEL
Anion gap: 7 (ref 5–15)
BUN: 35 mg/dL — ABNORMAL HIGH (ref 6–20)
CO2: 21 mmol/L — ABNORMAL LOW (ref 22–32)
Calcium: 8 mg/dL — ABNORMAL LOW (ref 8.9–10.3)
Chloride: 112 mmol/L — ABNORMAL HIGH (ref 101–111)
Creatinine, Ser: 1.9 mg/dL — ABNORMAL HIGH (ref 0.44–1.00)
GFR calc Af Amer: 27 mL/min — ABNORMAL LOW (ref >60)
GFR, EST NON AFRICAN AMERICAN: 23 mL/min — AB (ref >60)
GLUCOSE: 110 mg/dL — AB (ref 65–99)
Potassium: 4.3 mmol/L (ref 3.5–5.1)
Sodium: 140 mmol/L (ref 135–145)

## 2017-02-23 MED ORDER — FUROSEMIDE 40 MG PO TABS: 40.0000 mg | ORAL_TABLET | Freq: Every day | ORAL | Status: DC

## 2017-02-23 MED ORDER — POTASSIUM CHLORIDE CRYS ER 20 MEQ PO TBCR: 20.0000 meq | EXTENDED_RELEASE_TABLET | Freq: Every day | ORAL | Status: DC

## 2017-02-23 MED ORDER — TRAZODONE HCL 100 MG PO TABS: 100.0000 mg | ORAL_TABLET | Freq: Every evening | ORAL | 0 refills | Status: AC | PRN

## 2017-02-23 MED ORDER — FUROSEMIDE 10 MG/ML IJ SOLN
20.0000 mg | Freq: Once | INTRAMUSCULAR | Status: AC
  Administered 2017-02-23: 20 mg via INTRAVENOUS
  Filled 2017-02-23: qty 2

## 2017-02-23 MED ORDER — RIVAROXABAN 15 MG PO TABS: 15.0000 mg | ORAL_TABLET | Freq: Every day | ORAL | 0 refills | Status: DC

## 2017-02-23 NOTE — Progress Notes (Signed)
Pt. Slept entire night with, c/o pain x1. No SOB or acute distress note.

## 2017-02-23 NOTE — Progress Notes (Signed)
Visit made. Patient seen having a bath, alert and interactive. Staff aide Mykia present. Plan is for discharge home via car today, per chart note review patient's heart rate has been running in the high 50's, medications have been adjusted per cardiology. After visit summary faxed to triage, hospice team updated to discharge. Flo Shanks RN, BSN, [facility] [place], hospital liaison [phone]

## 2017-02-23 NOTE — Care Management Important Message (Signed)
Important Message  Patient Details  Name: Mercedes Rodriguez MRN: [id number] Date of Birth: 11/04/1929   Medicare Important Message Given:  Yes Signed IM notice given   Katrina Stack, RN 02/23/2017, 9:02 AM

## 2017-02-23 NOTE — Care Management (Signed)
Discharge home today with resumption of hospice services in the home.  Craige Cotta with [facility] notified.  Family to transport home

## 2017-02-23 NOTE — Progress Notes (Signed)
IV and tele removed from patient. Discharge instructions given to patient and son. Verbalized understanding. No distress at this time. Son is at bedside and will transport patient home.

## 2017-02-23 NOTE — Discharge Summary (Signed)
[place] [facility] [place] NAME: Esbeydi Manago    MR#:  [id number]  DATE OF BIRTH:  1929-07-31  DATE OF ADMISSION:  02/20/2017 ADMITTING PHYSICIAN: [name], MD  DATE OF DISCHARGE: 02/23/2017 11:44 AM  PRIMARY CARE PHYSICIAN: Lynnell Jude, MD    ADMISSION DIAGNOSIS:  Bradycardia [R00.1]  DISCHARGE DIAGNOSIS:  Active Problems:   Sinus bradycardia   Bradycardia   SECONDARY DIAGNOSIS:   Past Medical History:  Diagnosis Date  . Arrhythmia   . Atrial fibrillation ([place])   . CHF (congestive heart failure) ([place])   . Hearing loss   . Hypertension     HOSPITAL COURSE:   1.  Acute kidney injury on chronic kidney disease stage III.  The patient was given IV fluid hydration during the hospital course.  Creatinine has improved.  Creatinine was 2.88 on admission and down to 1.9 upon discharge home.  Previous baseline around 1.2. 2.  Severe bradycardia with heart rates in the 20s and 30s.  The patient was on 3 medications for atrial fibrillation rate control including T cassette and amiodarone and metoprolol.  All 3 of these medications were discontinued.  Heart rate still in the 50s and 60s upon disposition.  Follow-up with Dr. Nehemiah Massed this week to monitor heart rate. 3.  Chronic atrial fibrillation on Xarelto now renally dosed 4.  Chronic diastolic congestive heart failure.  The patient's Lasix was held during this hospital course.  Can restart Lasix 40 mg daily as outpatient. 5.  Insomnia.  I did give trazodone 6.  Anemia.  Continue to watch as outpatient.   DISCHARGE CONDITIONS:   Satisfactory Patient does follow with hospice as outpatient  CONSULTS OBTAINED:  Treatment Team:  Corey Skains, MD  DRUG ALLERGIES:  No Known Allergies  DISCHARGE MEDICATIONS:   Allergies as of 02/23/2017   No Known Allergies     Medication List    STOP taking these medications   amiodarone 200 MG tablet Commonly known as:  PACERONE    dofetilide 250 MCG capsule Commonly known as:  TIKOSYN   HYDROcodone-acetaminophen 5-325 MG tablet Commonly known as:  NORCO/VICODIN   metoprolol succinate 100 MG 24 hr tablet Commonly known as:  TOPROL-XL     TAKE these medications   acetaminophen 325 MG tablet Commonly known as:  TYLENOL Take 2 tablets (650 mg total) by mouth every 6 (six) hours as needed for mild pain (or Fever >/= 101). What changed:  Another medication with the same name was removed. Continue taking this medication, and follow the directions you see here.   albuterol (2.5 MG/3ML) 0.083% nebulizer solution Commonly known as:  PROVENTIL Take 3 mLs (2.5 mg total) by nebulization every 6 (six) hours as needed for wheezing or shortness of breath.   carbamide peroxide 6.5 % OTIC solution Commonly known as:  DEBROX Place 4 drops into both ears 2 (two) times daily.   furosemide 40 MG tablet Commonly known as:  LASIX Take 1 tablet (40 mg total) by mouth daily. What changed:  when to take this   guaiFENesin-dextromethorphan 100-10 MG/5ML syrup Commonly known as:  ROBITUSSIN DM Take 10 mLs by mouth every 6 (six) hours as needed for cough.   haloperidol 1 MG tablet Commonly known as:  HALDOL Take 1 mg by mouth every 6 (six) hours as needed for agitation.   hyoscyamine 0.125 MG tablet Commonly known as:  LEVSIN, ANASPAZ Take 0.125 mg by mouth. Every 4 to 6 hours  as needed [secretions]   LORazepam 0.5 MG tablet Commonly known as:  ATIVAN Take 0.5 mg by mouth every 4 (four) hours as needed for anxiety.   morphine CONCENTRATE 10 mg / 0.5 ml concentrated solution Take 5 mg by mouth every hour as needed for severe pain or shortness of breath.   potassium chloride SA 20 MEQ tablet Commonly known as:  K-DUR,KLOR-CON Take 1 tablet (20 mEq total) by mouth daily. What changed:  when to take this   Rivaroxaban 15 MG Tabs tablet Commonly known as:  XARELTO Take 1 tablet (15 mg total) by mouth daily with  supper. What changed:    medication strength  how much to take  when to take this   senna-docusate 8.6-50 MG tablet Commonly known as:  Senokot-S Take 1 tablet by mouth at bedtime as needed for mild constipation.   traZODone 100 MG tablet Commonly known as:  DESYREL Take 1 tablet (100 mg total) by mouth at bedtime as needed for sleep.        DISCHARGE INSTRUCTIONS:   Follow-up with PMD 1 week Follow-up with Dr. Nehemiah Massed 1 week  If you experience worsening of your admission symptoms, develop shortness of breath, life threatening emergency, suicidal or homicidal thoughts you must seek medical attention immediately by calling 911 or calling your MD immediately  if symptoms less severe.  You Must read complete instructions/literature along with all the possible adverse reactions/side effects for all the Medicines you take and that have been prescribed to you. Take any new Medicines after you have completely understood and accept all the possible adverse reactions/side effects.   Please note  You were cared for by a hospitalist during your hospital stay. If you have any questions about your discharge medications or the care you received while you were in the hospital after you are discharged, you can call the unit and asked to speak with the hospitalist on call if the hospitalist that took care of you is not available. Once you are discharged, your primary care physician will handle any further medical issues. Please note that NO REFILLS for any discharge medications will be authorized once you are discharged, as it is imperative that you return to your primary care physician (or establish a relationship with a primary care physician if you do not have one) for your aftercare needs so that they can reassess your need for medications and monitor your lab values.    Today   CHIEF COMPLAINT:   Chief Complaint  Patient presents with  . Back Pain    HISTORY OF PRESENT ILLNESS:   Ellason Segar  is a 84 y.o. female came in with bradycardia and found to have acute kidney injury   VITAL SIGNS:  Blood pressure (!) 132/44, pulse (!) 57, temperature 98.1 F (36.7 C), temperature source Oral, resp. rate 18, weight 73.3 kg (161 lb 8 oz), SpO2 97 %.    PHYSICAL EXAMINATION:  GENERAL:  84 y.o.-year-old patient lying in the bed with no acute distress.  EYES: Pupils equal, round, reactive to light and accommodation. No scleral icterus. Extraocular muscles intact.  HEENT: Head atraumatic, normocephalic. Oropharynx and nasopharynx clear.  NECK:  Supple, no jugular venous distention. No thyroid enlargement, no tenderness.  LUNGS:  decreased  breath sounds bilateral bases. no wheezing.  Positive rhonchi at the bases no use of accessory muscles of respiration.  CARDIOVASCULAR: S1, S2  bradycardia.  3 out of 6 systolic murmurs.  No rubs, or gallops.  ABDOMEN: Soft,  non-tender, non-distended. Bowel sounds present. No organomegaly or mass.  EXTREMITIES:  trace edema, no cyanosis, or clubbing.  NEUROLOGIC: Cranial nerves II through XII are intact. Muscle strength 5/5 in all extremities. Sensation intact. Gait not checked.  PSYCHIATRIC: The patient is alert and oriented x 3.  SKIN: No obvious rash, lesion, or ulcer.   DATA REVIEW:   CBC Recent Labs  Lab 02/20/17 1344  WBC 7.7  HGB 9.6*  HCT 30.6*  PLT 153    Chemistries  Recent Labs  Lab 02/23/17 0405  NA 140  K 4.3  CL 112*  CO2 21*  GLUCOSE 110*  BUN 35*  CREATININE 1.90*  CALCIUM 8.0*    Cardiac Enzymes Recent Labs  Lab 02/20/17 2057  TROPONINI <0.03      Management plans discussed with the patient, family and they are in agreement.  CODE STATUS:  Code Status History    Date Active Date Inactive Code Status Order ID Comments User Context   02/20/2017 16:25 02/23/2017 14:49 DNR [id number]  [name], MD Inpatient   12/31/2016 12:06 01/08/2017 17:50 DNR [id number]  Bettey Costa, MD Inpatient     Questions for Most Recent Historical Code Status (Order [id number])    Question Answer Comment   In the event of cardiac or respiratory ARREST Do not call a "code blue"    In the event of cardiac or respiratory ARREST Do not perform Intubation, CPR, defibrillation or ACLS    In the event of cardiac or respiratory ARREST Use medication by any route, position, wound care, and other measures to relive pain and suffering. May use oxygen, suction and manual treatment of airway obstruction as needed for comfort.    Comments RN may pronounce       TOTAL TIME TAKING CARE OF THIS PATIENT: 35 minutes.    Loletha Grayer M.D on 02/23/2017 at 3:50 PM  Between 7am to 6pm - Pager - [phone]  After 6pm go to [email or web address] - password [email or web address] [facility]  [phone]  CC: Primary care physician; Lynnell Jude, MD

## 2017-03-26 NOTE — Progress Notes (Signed)
Patient ID: Mercedes Rodriguez, female    DOB: [birth year], 84 y.o.   MRN: [id number]  HPI  Mercedes Rodriguez is a 84 y/o female with a history of atrial fibrillation, HTN, hearing loss and chronic heart failure.   Echo report from 09/14/15 reviewed and showed an EF of 55-60%.  Admitted 02/20/17 due to severe bradycardia and acute kidney injury. Given IV fluids and renal function improved. Medications were stopped due to bradycardia and HR improved into the 50's. Cardiology consult obtained and she was discharged after 3 days. Admitted 12/31/16 due to HF exacerbation. Cardiology and palliative care consults were obtained. Initially needed IV diuretics and then transitioned to oral diuretics. Needed IV cardizem drip to start with and then amiodarone drip for her atrial fibrillation. Changed to oral amiodarone. Elevated troponin thought to be due to demand ischemia. Discharged to SNF after 8 days.  She presents today for a follow-up visit with a chief complaint of minimal shortness of breath upon moderate exertion. She describes this as chronic in nature having been present for several years. She has associated fatigue, easy bruising and hearing loss. She denies any difficulty sleeping, abdominal distention, palpitations, edema, chest pain, cough, dizziness or known weight gain. She is back to living at home.   Past Medical History:  Diagnosis Date  . Arrhythmia   . Atrial fibrillation ([place])   . CHF (congestive heart failure) ([place])   . Hearing loss   . Hypertension    No past surgical history on file. No family history on file. Social History   Tobacco Use  . Smoking status: Never Smoker  . Smokeless tobacco: Never Used  Substance Use Topics  . Alcohol use: No   No Known Allergies  Prior to Admission medications   Medication Sig Start Date End Date Taking? Authorizing Provider  acetaminophen (TYLENOL) 325 MG tablet Take 2 tablets (650 mg total) by mouth every 6 (six) hours as needed for mild pain  (or Fever >/= 101). 01/07/17  Yes Gouru, Illene Silver, MD  carbamide peroxide (DEBROX) 6.5 % OTIC solution Place 4 drops into both ears 2 (two) times daily.   Yes [provider]  furosemide (LASIX) 40 MG tablet Take 1 tablet (40 mg total) by mouth daily. 02/23/17  Yes Wieting, Richard, MD  metoprolol succinate (TOPROL-XL) 100 MG 24 hr tablet Take 100 mg by mouth daily. Take with or immediately following a meal.   Yes [provider]  potassium chloride SA (K-DUR,KLOR-CON) 20 MEQ tablet Take 1 tablet (20 mEq total) by mouth daily. 02/23/17  Yes Wieting, Richard, MD  ranitidine (ZANTAC) 150 MG tablet Take 150 mg by mouth 2 (two) times daily as needed for heartburn.   Yes [provider]  Rivaroxaban (XARELTO) 15 MG TABS tablet Take 1 tablet (15 mg total) by mouth daily with supper. 02/23/17  Yes Loletha Grayer, MD  traZODone (DESYREL) 100 MG tablet Take 1 tablet (100 mg total) by mouth at bedtime as needed for sleep. 02/23/17  Yes Loletha Grayer, MD  albuterol (PROVENTIL) (2.5 MG/3ML) 0.083% nebulizer solution Take 3 mLs (2.5 mg total) by nebulization every 6 (six) hours as needed for wheezing or shortness of breath. Patient not taking: Reported on 03/30/2017 01/07/17   [name], MD  guaiFENesin-dextromethorphan (ROBITUSSIN DM) 100-10 MG/5ML syrup Take 10 mLs by mouth every 6 (six) hours as needed for cough. Patient not taking: Reported on 03/30/2017 01/07/17   [name], MD  haloperidol (HALDOL) 1 MG tablet Take 1 mg by  mouth every 6 (six) hours as needed for agitation.    [provider]  hyoscyamine (LEVSIN, ANASPAZ) 0.125 MG tablet Take 0.125 mg by mouth. Every 4 to 6 hours as needed [secretions]    [provider]  LORazepam (ATIVAN) 0.5 MG tablet Take 0.5 mg by mouth every 4 (four) hours as needed for anxiety.    [provider]  Morphine Sulfate (MORPHINE CONCENTRATE) 10 mg / 0.5 ml concentrated solution Take 5 mg by mouth every hour as needed for  severe pain or shortness of breath.    [provider]  senna-docusate (SENOKOT-S) 8.6-50 MG tablet Take 1 tablet by mouth at bedtime as needed for mild constipation. Patient not taking: Reported on 03/30/2017 01/07/17   [name], MD   Review of Systems  Constitutional: Positive for fatigue. Negative for appetite change.  HENT: Positive for hearing loss. Negative for congestion, nosebleeds, postnasal drip and sore throat.   Eyes: Negative.   Respiratory: Positive for shortness of breath (better). Negative for cough and chest tightness.   Cardiovascular: Negative for chest pain, palpitations and leg swelling.  Gastrointestinal: Negative for abdominal distention and abdominal pain.  Endocrine: Negative.   Genitourinary: Negative.   Musculoskeletal: Negative for back pain and neck pain.  Skin: Negative.   Allergic/Immunologic: Negative.   Neurological: Negative for dizziness and light-headedness.  Hematological: Negative for adenopathy. Bruises/bleeds easily.  Psychiatric/Behavioral: Negative for dysphoric mood and sleep disturbance (sleeping on 2 pillows and wear oxygen at 2L). The patient is not nervous/anxious.    Vitals:   03/30/17 1005  BP: (!) 140/37  Pulse: (!) 101  Resp: 18  SpO2: 97%  Weight: 165 lb (74.8 kg)  Height: 5\' 4"  (1.626 m)   Wt Readings from Last 3 Encounters:  03/30/17 165 lb (74.8 kg)  02/21/17 161 lb 8 oz (73.3 kg)  01/29/17 160 lb 4 oz (72.7 kg)   Lab Results  Component Value Date   CREATININE 1.90 (H) 02/23/2017   CREATININE 2.12 (H) 02/22/2017   CREATININE 2.79 (H) 02/21/2017   Physical Exam  Constitutional: She is oriented to person, place, and time. She appears well-developed and well-nourished.  HENT:  Right Ear: Decreased hearing is noted.  Left Ear: Decreased hearing is noted.  Neck: Normal range of motion. Neck supple. No JVD present.  Cardiovascular: Regular rhythm. Tachycardia present.  Pulmonary/Chest: Effort normal. She has no  wheezes. She has no rales.  Abdominal: Soft. She exhibits no distension. There is no tenderness.  Musculoskeletal: She exhibits no edema or tenderness.  Neurological: She is alert and oriented to person, place, and time.  Skin: Skin is warm and dry.  Psychiatric: She has a normal mood and affect. Her behavior is normal. Thought content normal.  Nursing note and vitals reviewed.  Assessment & Plan:   1: Chronic heart failure with preserved ejection fraction- - NYHA class II - euvolemic today - not weighing daily and she was instructed to begin weighing every morning and to call for an overnight weight gain of >2 pounds or a weekly weight gain of >5 pounds - weight up 5 pounds from 01/29/17 - not adding salt to her food and is trying to eat low sodium foods - saw cardiology Nehemiah Massed) 03/12/17 and returns 04/02/17 - BNP from 12/31/16 was 1058.0 - PharmD reconciled medications with the patient - patient reports receiving her flu vaccine for this season  2: HTN- - BP looks good - follows with PCP ([facility]) - BMP from 02/23/17 reviewed and  showed sodium 140, potassium 4.3 and GFR 23  3: Atrial fibrillation- - saw [facility] cardiology 10/03/16 - tachycardic today but was bradycardic last time  Medication bottles were reviewed.   Return in 3 months or sooner for any questions/problems before then.

## 2017-03-30 ENCOUNTER — Encounter: Payer: Self-pay | Admitting: Family

## 2017-03-30 ENCOUNTER — Ambulatory Visit: Payer: Medicare Other | Attending: Family | Admitting: Family

## 2017-03-30 VITALS — BP 140/37 | HR 101 | Resp 18 | Ht 64.0 in | Wt 165.0 lb

## 2017-03-30 DIAGNOSIS — R000 Tachycardia, unspecified: Secondary | ICD-10-CM | POA: Insufficient documentation

## 2017-03-30 DIAGNOSIS — I4891 Unspecified atrial fibrillation: Secondary | ICD-10-CM | POA: Insufficient documentation

## 2017-03-30 DIAGNOSIS — R001 Bradycardia, unspecified: Secondary | ICD-10-CM | POA: Insufficient documentation

## 2017-03-30 DIAGNOSIS — Z79899 Other long term (current) drug therapy: Secondary | ICD-10-CM | POA: Insufficient documentation

## 2017-03-30 DIAGNOSIS — I5032 Chronic diastolic (congestive) heart failure: Secondary | ICD-10-CM | POA: Insufficient documentation

## 2017-03-30 DIAGNOSIS — Z7901 Long term (current) use of anticoagulants: Secondary | ICD-10-CM | POA: Diagnosis not present

## 2017-03-30 DIAGNOSIS — I110 Hypertensive heart disease with heart failure: Secondary | ICD-10-CM | POA: Insufficient documentation

## 2017-03-30 DIAGNOSIS — I10 Essential (primary) hypertension: Secondary | ICD-10-CM

## 2017-03-30 NOTE — Patient Instructions (Addendum)
Begin weighing daily and call for an overnight weight gain of > 2 pounds or a weekly weight gain of >5 pounds. 

## 2017-04-09 ENCOUNTER — Emergency Department: Payer: Medicare HMO

## 2017-04-09 ENCOUNTER — Inpatient Hospital Stay
Admission: EM | Admit: 2017-04-09 | Discharge: 2017-04-13 | DRG: 377 | Disposition: A | Discharge status: Home / Self Care | Payer: Medicare HMO | Attending: Specialist | Admitting: Specialist

## 2017-04-09 ENCOUNTER — Other Ambulatory Visit: Payer: Self-pay

## 2017-04-09 DIAGNOSIS — Z66 Do not resuscitate: Secondary | ICD-10-CM | POA: Diagnosis present

## 2017-04-09 DIAGNOSIS — H9190 Unspecified hearing loss, unspecified ear: Secondary | ICD-10-CM | POA: Diagnosis present

## 2017-04-09 DIAGNOSIS — D6859 Other primary thrombophilia: Secondary | ICD-10-CM | POA: Diagnosis present

## 2017-04-09 DIAGNOSIS — Z7901 Long term (current) use of anticoagulants: Secondary | ICD-10-CM | POA: Diagnosis not present

## 2017-04-09 DIAGNOSIS — K449 Diaphragmatic hernia without obstruction or gangrene: Secondary | ICD-10-CM | POA: Diagnosis present

## 2017-04-09 DIAGNOSIS — I130 Hypertensive heart and chronic kidney disease with heart failure and stage 1 through stage 4 chronic kidney disease, or unspecified chronic kidney disease: Secondary | ICD-10-CM | POA: Diagnosis present

## 2017-04-09 DIAGNOSIS — K921 Melena: Secondary | ICD-10-CM | POA: Diagnosis present

## 2017-04-09 DIAGNOSIS — Z79899 Other long term (current) drug therapy: Secondary | ICD-10-CM

## 2017-04-09 DIAGNOSIS — N170 Acute kidney failure with tubular necrosis: Secondary | ICD-10-CM | POA: Diagnosis present

## 2017-04-09 DIAGNOSIS — K922 Gastrointestinal hemorrhage, unspecified: Secondary | ICD-10-CM | POA: Diagnosis present

## 2017-04-09 DIAGNOSIS — I5032 Chronic diastolic (congestive) heart failure: Secondary | ICD-10-CM | POA: Diagnosis present

## 2017-04-09 DIAGNOSIS — E869 Volume depletion, unspecified: Secondary | ICD-10-CM | POA: Diagnosis present

## 2017-04-09 DIAGNOSIS — N184 Chronic kidney disease, stage 4 (severe): Secondary | ICD-10-CM | POA: Diagnosis present

## 2017-04-09 DIAGNOSIS — I482 Chronic atrial fibrillation: Secondary | ICD-10-CM | POA: Diagnosis present

## 2017-04-09 DIAGNOSIS — R0602 Shortness of breath: Secondary | ICD-10-CM

## 2017-04-09 DIAGNOSIS — D62 Acute posthemorrhagic anemia: Secondary | ICD-10-CM | POA: Diagnosis present

## 2017-04-09 LAB — BASIC METABOLIC PANEL
ANION GAP: 10 (ref 5–15)
BUN: 51 mg/dL — ABNORMAL HIGH (ref 6–20)
CALCIUM: 8.3 mg/dL — AB (ref 8.9–10.3)
CO2: 22 mmol/L (ref 22–32)
CREATININE: 2.38 mg/dL — AB (ref 0.44–1.00)
Chloride: 108 mmol/L (ref 101–111)
GFR, EST AFRICAN AMERICAN: 20 mL/min — AB (ref >60)
GFR, EST NON AFRICAN AMERICAN: 17 mL/min — AB (ref >60)
Glucose, Bld: 127 mg/dL — ABNORMAL HIGH (ref 65–99)
Potassium: 4.4 mmol/L (ref 3.5–5.1)
SODIUM: 140 mmol/L (ref 135–145)

## 2017-04-09 LAB — TROPONIN I

## 2017-04-09 LAB — CBC
HCT: 13.3 % — CL (ref 35.0–47.0)
HEMOGLOBIN: 4 g/dL — AB (ref 12.0–16.0)
MCH: 22.5 pg — ABNORMAL LOW (ref 26.0–34.0)
MCHC: 29.8 g/dL — ABNORMAL LOW (ref 32.0–36.0)
MCV: 75.3 fL — ABNORMAL LOW (ref 80.0–100.0)
PLATELETS: 308 10*3/uL (ref 150–440)
RBC: 1.77 MIL/uL — AB (ref 3.80–5.20)
RDW: 19.8 % — ABNORMAL HIGH (ref 11.5–14.5)
WBC: 7.3 10*3/uL (ref 3.6–11.0)

## 2017-04-09 LAB — APTT: aPTT: 36 seconds (ref 24–36)

## 2017-04-09 LAB — HEMOGLOBIN AND HEMATOCRIT, BLOOD
HCT: 18.9 % — ABNORMAL LOW (ref 35.0–47.0)
Hemoglobin: 5.8 g/dL — ABNORMAL LOW (ref 12.0–16.0)

## 2017-04-09 LAB — ABO/RH: ABO/RH(D): O POS

## 2017-04-09 LAB — PROTIME-INR
INR: 4.11
Prothrombin Time: 39.5 seconds — ABNORMAL HIGH (ref 11.4–15.2)

## 2017-04-09 MED ORDER — HYDROCODONE-ACETAMINOPHEN 5-325 MG PO TABS
1.0000 | ORAL_TABLET | ORAL | Status: DC | PRN
  Administered 2017-04-12 (×2): 2 via ORAL
  Administered 2017-04-12: 1 via ORAL
  Filled 2017-04-09: qty 2
  Filled 2017-04-09: qty 1
  Filled 2017-04-09: qty 2

## 2017-04-09 MED ORDER — FUROSEMIDE 10 MG/ML IJ SOLN: 20.0000 mg | INTRAMUSCULAR | Status: DC | PRN

## 2017-04-09 MED ORDER — PROTHROMBIN COMPLEX CONC HUMAN 500 UNITS IV KIT
3500.0000 [IU] | PACK | Status: AC
  Administered 2017-04-09: 3500 [IU] via INTRAVENOUS
  Filled 2017-04-09: qty 3500

## 2017-04-09 MED ORDER — DIPHENHYDRAMINE HCL 25 MG PO CAPS
25.0000 mg | ORAL_CAPSULE | Freq: Once | ORAL | Status: AC
  Administered 2017-04-09: 25 mg via ORAL
  Filled 2017-04-09: qty 1

## 2017-04-09 MED ORDER — ALBUTEROL SULFATE (2.5 MG/3ML) 0.083% IN NEBU: 2.5000 mg | INHALATION_SOLUTION | Freq: Four times a day (QID) | RESPIRATORY_TRACT | Status: DC | PRN

## 2017-04-09 MED ORDER — METOPROLOL SUCCINATE ER 100 MG PO TB24
100.0000 mg | ORAL_TABLET | Freq: Every day | ORAL | Status: DC
  Administered 2017-04-12 – 2017-04-13 (×2): 100 mg via ORAL
  Filled 2017-04-09 (×3): qty 1

## 2017-04-09 MED ORDER — POLYETHYLENE GLYCOL 3350 17 G PO PACK: 17.0000 g | PACK | Freq: Every day | ORAL | Status: DC | PRN

## 2017-04-09 MED ORDER — PANTOPRAZOLE SODIUM 40 MG IV SOLR: 40.0000 mg | Freq: Two times a day (BID) | INTRAVENOUS | Status: DC

## 2017-04-09 MED ORDER — SODIUM CHLORIDE 0.9 % IV SOLN
8.0000 mg/h | INTRAVENOUS | Status: DC
  Administered 2017-04-09 – 2017-04-12 (×6): 8 mg/h via INTRAVENOUS
  Filled 2017-04-09 (×5): qty 80

## 2017-04-09 MED ORDER — SODIUM CHLORIDE 0.9 % IV SOLN
8.0000 mg/h | INTRAVENOUS | Status: DC
  Administered 2017-04-09 (×3): 8 mg/h via INTRAVENOUS
  Filled 2017-04-09: qty 80

## 2017-04-09 MED ORDER — DEXTROSE-NACL 5-0.45 % IV SOLN
INTRAVENOUS | Status: DC
  Administered 2017-04-10 – 2017-04-11 (×2): via INTRAVENOUS

## 2017-04-09 MED ORDER — SODIUM CHLORIDE 0.9 % IV SOLN
80.0000 mg | Freq: Once | INTRAVENOUS | Status: AC
  Administered 2017-04-09: 80 mg via INTRAVENOUS
  Filled 2017-04-09: qty 40

## 2017-04-09 MED ORDER — DOCUSATE SODIUM 100 MG PO CAPS
100.0000 mg | ORAL_CAPSULE | Freq: Two times a day (BID) | ORAL | Status: DC
  Administered 2017-04-09 – 2017-04-13 (×6): 100 mg via ORAL
  Filled 2017-04-09 (×7): qty 1

## 2017-04-09 MED ORDER — SODIUM CHLORIDE 0.9 % IV SOLN
80.0000 mg | Freq: Once | INTRAVENOUS | Status: AC
  Administered 2017-04-09: 80 mg via INTRAVENOUS
  Filled 2017-04-09: qty 80

## 2017-04-09 MED ORDER — SODIUM CHLORIDE 0.9 % IV SOLN
Freq: Once | INTRAVENOUS | Status: AC
  Administered 2017-04-09: 18:00:00 via INTRAVENOUS

## 2017-04-09 MED ORDER — ACETAMINOPHEN 325 MG PO TABS: 650.0000 mg | ORAL_TABLET | Freq: Four times a day (QID) | ORAL | Status: DC | PRN

## 2017-04-09 MED ORDER — ACETAMINOPHEN 325 MG PO TABS
650.0000 mg | ORAL_TABLET | Freq: Once | ORAL | Status: AC
  Administered 2017-04-09: 650 mg via ORAL
  Filled 2017-04-09: qty 2

## 2017-04-09 MED ORDER — ONDANSETRON HCL 4 MG/2ML IJ SOLN: 4.0000 mg | Freq: Four times a day (QID) | INTRAMUSCULAR | Status: DC | PRN

## 2017-04-09 MED ORDER — ACETAMINOPHEN 650 MG RE SUPP: 650.0000 mg | Freq: Four times a day (QID) | RECTAL | Status: DC | PRN

## 2017-04-09 MED ORDER — SODIUM CHLORIDE 0.9 % IV SOLN
10.0000 mL/h | Freq: Once | INTRAVENOUS | Status: AC
  Administered 2017-04-09: 10 mL/h via INTRAVENOUS

## 2017-04-09 MED ORDER — ONDANSETRON HCL 4 MG PO TABS: 4.0000 mg | ORAL_TABLET | Freq: Four times a day (QID) | ORAL | Status: DC | PRN

## 2017-04-09 NOTE — ED Notes (Signed)
Critical [facility] called by lab, 4.0

## 2017-04-09 NOTE — Progress Notes (Signed)
Family Meeting Note  Advance Directive:yes  Today a meeting took place with the Patient.  Patient is able to participate   The following clinical team members were present during this meeting:MD  The following were discussed:Patient's diagnosis: , Patient's progosis: Unable to determine and Goals for treatment: DNR  Additional follow-up to be provided: prn  Time spent during discussion:20 minutes  Roseanne Juenger D Ocie Tino, MD  

## 2017-04-09 NOTE — ED Notes (Signed)
Pt placed on 2L O2 via [place] due to O2 saturation at 88-90%

## 2017-04-09 NOTE — H&P (Signed)
[facility] [place] NAME: Mercedes Rodriguez    MR#:  [id number]  DATE OF BIRTH:  01-18-1929  DATE OF ADMISSION:  04/09/2017  PRIMARY CARE PHYSICIAN: Lynnell Jude, MD   REQUESTING/REFERRING PHYSICIAN:   CHIEF COMPLAINT:   Chief Complaint  Patient presents with  . Shortness of Breath    HISTORY OF PRESENT ILLNESS: Mercedes Rodriguez  is a 84 y.o. female with a known history per below presenting to the emergency room with a one-week history of worsening shortness of breath, vague intermittent abdominal pain, patient was brought to the emergency room via EMS at the request of the son, per the patient's son-patient has not taking medication correctly since December 2018, but the patient denies this-patient states that she treated she does take her medication as directed, patient is on chronic Xarelto for A. fib, ER workup noted for hemoglobin for down from 9.6, creatinine 2.3 with baseline normal renal function, chest x-ray unimpressive, EKG noted for rate control A. fib, per ER attending-patient noted to have black stool consistent with melena on examination, patient in no apparent distress, resting comfortably in bed, patient is now being admitted for acute melena/GI bleeding on Xarelto.  PAST MEDICAL HISTORY:   Past Medical History:  Diagnosis Date  . Arrhythmia   . Atrial fibrillation ([place])   . CHF (congestive heart failure) ([place])   . Hearing loss   . Hypertension     PAST SURGICAL HISTORY: None  SOCIAL HISTORY:  Social History   Tobacco Use  . Smoking status: Never Smoker  . Smokeless tobacco: Never Used  Substance Use Topics  . Alcohol use: No    FAMILY HISTORY: Breat cancer  DRUG ALLERGIES: NKDA  REVIEW OF SYSTEMS:   CONSTITUTIONAL: No fever, +fatigue/ weakness.  EYES: No blurred or double vision.  EARS, NOSE, AND THROAT: No tinnitus or ear pain.  RESPIRATORY: No cough, +shortness of breath, no wheezing or hemoptysis.   CARDIOVASCULAR: No chest pain, orthopnea, edema.  GASTROINTESTINAL: No nausea, vomiting, diarrhea or abdominal pain.  GENITOURINARY: No dysuria, hematuria.  ENDOCRINE: No polyuria, nocturia,  HEMATOLOGY: No anemia, easy bruising or bleeding SKIN: No rash or lesion. MUSCULOSKELETAL: No joint pain or arthritis.   NEUROLOGIC: No tingling, numbness, weakness.  PSYCHIATRY: No anxiety or depression.   MEDICATIONS AT HOME:  Prior to Admission medications   Medication Sig Start Date End Date Taking? Authorizing Provider  acetaminophen (TYLENOL) 325 MG tablet Take 2 tablets (650 mg total) by mouth every 6 (six) hours as needed for mild pain (or Fever >/= 101). 01/07/17   Gouru, Illene Silver, MD  albuterol (PROVENTIL) (2.5 MG/3ML) 0.083% nebulizer solution Take 3 mLs (2.5 mg total) by nebulization every 6 (six) hours as needed for wheezing or shortness of breath. Patient not taking: Reported on 03/30/2017 01/07/17   [name], MD  carbamide peroxide (DEBROX) 6.5 % OTIC solution Place 4 drops into both ears 2 (two) times daily.    [provider]  furosemide (LASIX) 40 MG tablet Take 1 tablet (40 mg total) by mouth daily. 02/23/17   Loletha Grayer, MD  haloperidol (HALDOL) 1 MG tablet Take 1 mg by mouth every 6 (six) hours as needed for agitation.    [provider]  hyoscyamine (LEVSIN, ANASPAZ) 0.125 MG tablet Take 0.125 mg by mouth. Every 4 to 6 hours as needed [secretions]    [provider]  LORazepam (ATIVAN) 0.5 MG tablet Take 0.5 mg by mouth every 4 (four)  hours as needed for anxiety.    [provider]  metoprolol succinate (TOPROL-XL) 100 MG 24 hr tablet Take 100 mg by mouth daily. Take with or immediately following a meal.    [provider]  Morphine Sulfate (MORPHINE CONCENTRATE) 10 mg / 0.5 ml concentrated solution Take 5 mg by mouth every hour as needed for severe pain or shortness of breath.    [provider]  potassium chloride SA  (K-DUR,KLOR-CON) 20 MEQ tablet Take 1 tablet (20 mEq total) by mouth daily. 02/23/17   Loletha Grayer, MD  ranitidine (ZANTAC) 150 MG tablet Take 150 mg by mouth 2 (two) times daily as needed for heartburn.    [provider]  Rivaroxaban (XARELTO) 15 MG TABS tablet Take 1 tablet (15 mg total) by mouth daily with supper. 02/23/17   Loletha Grayer, MD  traZODone (DESYREL) 100 MG tablet Take 1 tablet (100 mg total) by mouth at bedtime as needed for sleep. 02/23/17   Loletha Grayer, MD      PHYSICAL EXAMINATION:   VITAL SIGNS: Blood pressure 102/69, pulse 76, temperature (!) 97.1 F (36.2 C), temperature source Oral, resp. rate 18, height 5\' 4"  (1.626 m), weight 74.8 kg (165 lb), SpO2 100 %.  GENERAL:  84 y.o.-year-old patient lying in the bed with no acute distress.  Frail-appearing EYES: Pupils equal, round, reactive to light and accommodation. No scleral icterus. Extraocular muscles intact.  HEENT: Head atraumatic, normocephalic. Oropharynx and nasopharynx clear.  NECK:  Supple, no jugular venous distention. No thyroid enlargement, no tenderness.  LUNGS: Normal breath sounds bilaterally, no wheezing, rales,rhonchi or crepitation. No use of accessory muscles of respiration.  CARDIOVASCULAR: S1, S2 normal. No murmurs, rubs, or gallops.  ABDOMEN: Soft, nontender, nondistended. Bowel sounds present. No organomegaly or mass.  EXTREMITIES: No pedal edema, cyanosis, or clubbing.  NEUROLOGIC: Cranial nerves II through XII are intact. MAES. Gait not checked.  PSYCHIATRIC: The patient is alert and oriented x 3.  SKIN: No obvious rash, lesion, or ulcer.   LABORATORY PANEL:   CBC Recent Labs  Lab 04/09/17 1135  WBC 7.3  HGB 4.0*  HCT 13.3*  PLT 308  MCV 75.3*  MCH 22.5*  MCHC 29.8*  RDW 19.8*   ------------------------------------------------------------------------------------------------------------------  Chemistries  Recent Labs  Lab 04/09/17 1135  NA 140  K 4.4  CL  108  CO2 22  GLUCOSE 127*  BUN 51*  CREATININE 2.38*  CALCIUM 8.3*   ------------------------------------------------------------------------------------------------------------------ estimated creatinine clearance is 17.1 mL/min (A) (by C-G formula based on SCr of 2.38 mg/dL (H)). ------------------------------------------------------------------------------------------------------------------ No results for input(s): TSH, T4TOTAL, T3FREE, THYROIDAB in the last 72 hours.  Invalid input(s): FREET3   Coagulation profile No results for input(s): INR, PROTIME in the last 168 hours. ------------------------------------------------------------------------------------------------------------------- No results for input(s): DDIMER in the last 72 hours. -------------------------------------------------------------------------------------------------------------------  Cardiac Enzymes Recent Labs  Lab 04/09/17 1135  TROPONINI <0.03   ------------------------------------------------------------------------------------------------------------------ Invalid input(s): POCBNP  ---------------------------------------------------------------------------------------------------------------  Urinalysis    Component Value Date/Time   COLORURINE YELLOW (A) 02/21/2017 0630   APPEARANCEUR CLEAR (A) 02/21/2017 0630   LABSPEC 1.011 02/21/2017 0630   PHURINE 5.0 02/21/2017 0630   GLUCOSEU NEGATIVE 02/21/2017 0630   HGBUR NEGATIVE 02/21/2017 0630   BILIRUBINUR NEGATIVE 02/21/2017 0630   KETONESUR NEGATIVE 02/21/2017 0630   PROTEINUR NEGATIVE 02/21/2017 0630   NITRITE NEGATIVE 02/21/2017 0630   LEUKOCYTESUR NEGATIVE 02/21/2017 0630     RADIOLOGY: Dg Chest 2 View  Result Date: 04/09/2017 CLINICAL DATA:  Worsening shortness of breath  over the last week. History of congestive heart failure. EXAM: CHEST - 2 VIEW COMPARISON:  02/20/2017.  01/03/2017. FINDINGS: Cardiomegaly with left ventricular  prominence. Tortuous aorta. Chronic widespread pulmonary scarring. No sign of active infiltrate, mass, effusion or collapse. Both mild edema and mild pneumonia could be hidden within the chronic markings. No acute bone finding. IMPRESSION: Cardiomegaly and chronic pulmonary scarring. No active process identified, though low level edema or pneumonia could be hidden within the chronic markings. Electronically Signed   By: Nelson Chimes M.D.   On: 04/09/2017 12:27    EKG: Orders placed or performed during the hospital encounter of 04/09/17  . ED EKG within 10 minutes  . ED EKG within 10 minutes    IMPRESSION AND PLAN: 1 acute GI bleeding/melena Compounded by Xarelto use for A. Fib  admit to regular nursing floor bed, hold anticoagulations, avoid NSAIDs/antiplatelet medications, gastroenterology to see, transfuse 4 units packed red blood cells, CBC daily, Protonix drip, and continue close medical monitoring  2 acute symptomatic anemia  secondary to above  Plan of care per above  3 acute kidney injury, chronic kidney disease stage III Exacerbated by acute blood loss anemia Avoid nephrotoxic agents, strict I&O monitoring, daily weights, consult nephrology for expert opinion, transfusion as stated above, BMP daily  4 chronic diastolic congestive heart failure without exacerbation Previously on hospice Hold Lasix for now given acute kidney injury/volume depletion, continue metoprolol, avoid antiplatelet/anticoagulants stated above  5 chronic A. Fib Stable Continue metoprolol for rate control Xarelto discontinued given acute GI bleeding  All the records are reviewed and case discussed with ED provider. Management plans discussed with the patient, family and they are in agreement.  CODE STATUS:DNR per pt Code Status History    Date Active Date Inactive Code Status Order ID Comments User Context   02/20/2017 1625 02/23/2017 1449 DNR [id number]  [name], MD Inpatient   12/31/2016 1206  01/08/2017 1750 DNR [id number]  Bettey Costa, MD Inpatient    Questions for Most Recent Historical Code Status (Order [id number])    Question Answer Comment   In the event of cardiac or respiratory ARREST Do not call a "code blue"    In the event of cardiac or respiratory ARREST Do not perform Intubation, CPR, defibrillation or ACLS    In the event of cardiac or respiratory ARREST Use medication by any route, position, wound care, and other measures to relive pain and suffering. May use oxygen, suction and manual treatment of airway obstruction as needed for comfort.    Comments RN may pronounce        TOTAL TIME TAKING CARE OF THIS PATIENT: 45 minutes.    Avel Peace Patrena Santalucia M.D on 04/09/2017   Between 7am to 6pm - Pager - [phone]  After 6pm go to [email or web address] - password [email or web address] [facility]  Office  [phone]  CC: Primary care physician; Lynnell Jude, MD   Note: This dictation was prepared with Dragon dictation along with smaller phrase technology. Any transcriptional errors that result from this process are unintentional.

## 2017-04-09 NOTE — ED Triage Notes (Signed)
Pt comes into the ED via EMS from home with c/o increased SOB over the past week, states she was suppose to go to for a routine visit to her PCP today but just did not feel up to it so the son called [facility] reports CBG 194, b/p 118/80, HR64, O2 98% on RA. Son also reports to EMS that pt has not taken her daily meds since December, pt denies, states she has been taking them as prescribed.

## 2017-04-09 NOTE — ED Provider Notes (Signed)
[facility] Emergency Department Provider Note  ____________________________________________   I have reviewed the triage vital signs and the nursing notes.   HISTORY  Chief Complaint Shortness of Breath   History limited by: Not Limited   HPI Mercedes Rodriguez is a 84 y.o. female who presents to the emergency department today with primary concern for shortness of breath.  The patient states that the shortness of breath has been progressively getting worse over the past few days.  She has noticed worsening shortness of breath with exertion.  In addition she is complaining of some epigastric abdominal pain when she eats.  Furthermore she has noticed tarry and dark stools for the past few days. She denies any fevers.  Per medical record review patient has a history of atrial fibrillation. HTN. CHF.  Past Medical History:  Diagnosis Date  . Arrhythmia   . Atrial fibrillation ([place])   . CHF (congestive heart failure) ([place])   . Hearing loss   . Hypertension     Patient Active Problem List   Diagnosis Date Noted  . Bradycardia 02/21/2017  . Sinus bradycardia 02/20/2017  . HTN (hypertension) 01/29/2017  . Hard of hearing 01/21/2017  . Atrial fibrillation with RVR ([place])   . Palliative care encounter   . CHF (congestive heart failure) ([place]) 12/31/2016    History reviewed. No pertinent surgical history.  Prior to Admission medications   Medication Sig Start Date End Date Taking? Authorizing Provider  acetaminophen (TYLENOL) 325 MG tablet Take 2 tablets (650 mg total) by mouth every 6 (six) hours as needed for mild pain (or Fever >/= 101). 01/07/17   Gouru, Illene Silver, MD  albuterol (PROVENTIL) (2.5 MG/3ML) 0.083% nebulizer solution Take 3 mLs (2.5 mg total) by nebulization every 6 (six) hours as needed for wheezing or shortness of breath. Patient not taking: Reported on 03/30/2017 01/07/17   [name], MD  carbamide peroxide (DEBROX) 6.5 % OTIC solution Place 4 drops  into both ears 2 (two) times daily.    [provider]  furosemide (LASIX) 40 MG tablet Take 1 tablet (40 mg total) by mouth daily. 02/23/17   Loletha Grayer, MD  guaiFENesin-dextromethorphan (ROBITUSSIN DM) 100-10 MG/5ML syrup Take 10 mLs by mouth every 6 (six) hours as needed for cough. Patient not taking: Reported on 03/30/2017 01/07/17   [name], MD  haloperidol (HALDOL) 1 MG tablet Take 1 mg by mouth every 6 (six) hours as needed for agitation.    [provider]  hyoscyamine (LEVSIN, ANASPAZ) 0.125 MG tablet Take 0.125 mg by mouth. Every 4 to 6 hours as needed [secretions]    [provider]  LORazepam (ATIVAN) 0.5 MG tablet Take 0.5 mg by mouth every 4 (four) hours as needed for anxiety.    [provider]  metoprolol succinate (TOPROL-XL) 100 MG 24 hr tablet Take 100 mg by mouth daily. Take with or immediately following a meal.    [provider]  Morphine Sulfate (MORPHINE CONCENTRATE) 10 mg / 0.5 ml concentrated solution Take 5 mg by mouth every hour as needed for severe pain or shortness of breath.    [provider]  potassium chloride SA (K-DUR,KLOR-CON) 20 MEQ tablet Take 1 tablet (20 mEq total) by mouth daily. 02/23/17   Loletha Grayer, MD  ranitidine (ZANTAC) 150 MG tablet Take 150 mg by mouth 2 (two) times daily as needed for heartburn.    [provider]  Rivaroxaban (XARELTO) 15 MG TABS tablet Take 1 tablet (15  mg total) by mouth daily with supper. 02/23/17   Loletha Grayer, MD  senna-docusate (SENOKOT-S) 8.6-50 MG tablet Take 1 tablet by mouth at bedtime as needed for mild constipation. Patient not taking: Reported on 03/30/2017 01/07/17   [name], MD  traZODone (DESYREL) 100 MG tablet Take 1 tablet (100 mg total) by mouth at bedtime as needed for sleep. 02/23/17   Loletha Grayer, MD    Allergies Patient has no known allergies.  No family history on file.  Social History Social History   Tobacco Use   . Smoking status: Never Smoker  . Smokeless tobacco: Never Used  Substance Use Topics  . Alcohol use: No  . Drug use: Not on file    Review of Systems Constitutional: No fever/chills Eyes: No visual changes. ENT: No sore throat. Cardiovascular: Denies chest pain. Respiratory: Positive for shortness of breath. Gastrointestinal: Positive for epigastric pain. Positive for tarry dark stools. Genitourinary: Negative for dysuria. Musculoskeletal: Negative for back pain. Skin: Negative for rash. Neurological: Negative for headaches, focal weakness or numbness.  ____________________________________________   PHYSICAL EXAM:  VITAL SIGNS: ED Triage Vitals  Enc Vitals Group     BP 04/09/17 1122 102/69     Pulse Rate 04/09/17 1122 76     Resp 04/09/17 1122 18     Temp 04/09/17 1122 (!) 97.1 F (36.2 C)     Temp Source 04/09/17 1122 Oral     SpO2 04/09/17 1122 100 %     Weight 04/09/17 1125 165 lb (74.8 kg)     Height 04/09/17 1125 5\' 4"  (1.626 m)     Head Circumference --      Peak Flow --      Pain Score 04/09/17 1125 0   Constitutional: Alert and oriented. Well appearing and in no distress. Eyes: Conjunctivae are normal.  ENT   Head: Normocephalic and atraumatic.   Nose: No congestion/rhinnorhea.   Mouth/Throat: Mucous membranes are moist.   Neck: No stridor. Hematological/Lymphatic/Immunilogical: No cervical lymphadenopathy. Cardiovascular: Normal rate, regular rhythm.  No murmurs, rubs, or gallops.  Respiratory: Normal respiratory effort without tachypnea nor retractions. Breath sounds are clear and equal bilaterally. No wheezes/rales/rhonchi. Gastrointestinal: Soft and non tender. No rebound. No guarding.  Rectal: Melenotic stool on glove. GUIAC positive.  Musculoskeletal: Normal range of motion in all extremities. No lower extremity edema. Neurologic:  Normal speech and language. No gross focal neurologic deficits are appreciated.  Skin:  Skin is warm,  dry and intact. No rash noted. Psychiatric: Mood and affect are normal. Speech and behavior are normal. Patient exhibits appropriate insight and judgment.  ____________________________________________    LABS (pertinent positives/negatives)  CBC wbc 7.3, hgb 4.0, plt 308 BMP na 140, k 4.4, glu 127, cr 2.38 Trop <0.03  ____________________________________________   EKG  I, Nance Pear, attending physician, personally viewed and interpreted this EKG  EKG Time: 1131 Rate: 76 Rhythm: atrial fibrillation Axis: left axis deviation Intervals: qtc 454 QRS: incomplete RBBB ST changes: no st elevation Impression: abnormal ekg  ____________________________________________    RADIOLOGY  CXR Chronic markings, no obvious acute disease  ____________________________________________   PROCEDURES  Procedures  CRITICAL CARE Performed by: Nance Pear   Total critical care time: 20 minutes  Critical care time was exclusive of separately billable procedures and treating other patients.  Critical care was necessary to treat or prevent imminent or life-threatening deterioration.  Critical care was time spent personally by me on the following activities: development of treatment plan with patient and/or surrogate  as well as nursing, discussions with consultants, evaluation of patient's response to treatment, examination of patient, obtaining history from patient or surrogate, ordering and performing treatments and interventions, ordering and review of laboratory studies, ordering and review of radiographic studies, pulse oximetry and re-evaluation of patient's condition.  ____________________________________________   INITIAL IMPRESSION / ASSESSMENT AND PLAN / ED COURSE  Pertinent labs & imaging results that were available during my care of the patient were reviewed by me and considered in my medical decision making (see chart for details).  Patient presented to the  emergency department today because of concerns for shortness of breath.  Blood work does show a hemoglobin of 4.  Patient did have melanotic stool on the glove was guaiac positive.  Do have concerns for GI bleed, possibly gastric ulcer given complaints of epigastric pain.  Patient was consented for blood transfusions.  Will plan on admission to the hospital service.  Additionally will start Protonix and will give patient Kcentra given Xarelto on medication list. ____________________________________________   FINAL CLINICAL IMPRESSION(S) / ED DIAGNOSES  Final diagnoses:  SOB (shortness of breath)  Melena     Note: This dictation was prepared with Dragon dictation. Any transcriptional errors that result from this process are unintentional     Nance Pear, MD 04/09/17 1249

## 2017-04-09 NOTE — Progress Notes (Signed)
[place] for Kcentra  Indication: GI bleed and on Xarelto   No Known Allergies  Patient Measurements: Height: 5\' 4"  (162.6 cm) Weight: 165 lb (74.8 kg) IBW/kg (Calculated) : 54.7  Vital Signs: Temp: 97.5 F (36.4 C) (04/04 1438) Temp Source: Oral (04/04 1438) BP: 104/92 (04/04 1438) Pulse Rate: 60 (04/04 1438) Intake/Output from previous day: No intake/output data recorded. Intake/Output from this shift: Total I/O In: 360 [Blood:360] Out: -   Labs: Recent Labs    04/09/17 1135 04/09/17 1248  WBC 7.3  --   HGB 4.0*  --   HCT 13.3*  --   PLT 308  --   APTT  --  36  CREATININE 2.38*  --    Estimated Creatinine Clearance: 17.1 mL/min (A) (by C-G formula based on SCr of 2.38 mg/dL (H)).   Microbiology: No results found for this or any previous visit (from the past 720 hour(s)).  Medical History: Past Medical History:  Diagnosis Date  . Arrhythmia   . Atrial fibrillation ([place])   . CHF (congestive heart failure) ([place])   . Hearing loss   . Hypertension     Assessment: 84 year old female presents to ED with melana/GI bleeding on Xarelto chronically at home for AFib.   INR: 4.11 PT: 39.5  Hgb: 4.0   Goal of Therapy:  Normalize anticoagulation factors.   Plan:  KCentra administered @1340 .  Will follow up with CBC and INR tomorrow morning.   Candelaria Stagers, PharmD Pharmacy Resident  04/09/2017,2:52 PM

## 2017-04-10 LAB — CBC
HCT: 31.6 % — ABNORMAL LOW (ref 35.0–47.0)
HEMATOCRIT: 26.5 % — AB (ref 35.0–47.0)
HEMOGLOBIN: 10.2 g/dL — AB (ref 12.0–16.0)
Hemoglobin: 8.5 g/dL — ABNORMAL LOW (ref 12.0–16.0)
MCH: 26.2 pg (ref 26.0–34.0)
MCH: 26.8 pg (ref 26.0–34.0)
MCHC: 32.2 g/dL (ref 32.0–36.0)
MCHC: 32.2 g/dL (ref 32.0–36.0)
MCV: 81.3 fL (ref 80.0–100.0)
MCV: 83.3 fL (ref 80.0–100.0)
Platelets: 189 10*3/uL (ref 150–440)
Platelets: 177 10*3/uL (ref 150–440)
RBC: 3.26 MIL/uL — ABNORMAL LOW (ref 3.80–5.20)
RBC: 3.8 MIL/uL (ref 3.80–5.20)
RDW: 16.9 % — AB (ref 11.5–14.5)
RDW: 16.9 % — ABNORMAL HIGH (ref 11.5–14.5)
WBC: 4.7 10*3/uL (ref 3.6–11.0)
WBC: 5.9 10*3/uL (ref 3.6–11.0)

## 2017-04-10 LAB — PROTIME-INR
INR: 1.61
INR: 1.33
PROTHROMBIN TIME: 16.4 s — AB (ref 11.4–15.2)
Prothrombin Time: 19 seconds — ABNORMAL HIGH (ref 11.4–15.2)

## 2017-04-10 LAB — BASIC METABOLIC PANEL
ANION GAP: 5 (ref 5–15)
BUN: 42 mg/dL — AB (ref 6–20)
CHLORIDE: 112 mmol/L — AB (ref 101–111)
CO2: 25 mmol/L (ref 22–32)
Calcium: 8 mg/dL — ABNORMAL LOW (ref 8.9–10.3)
Creatinine, Ser: 2.03 mg/dL — ABNORMAL HIGH (ref 0.44–1.00)
GFR calc Af Amer: 25 mL/min — ABNORMAL LOW (ref >60)
GFR calc non Af Amer: 21 mL/min — ABNORMAL LOW (ref >60)
Glucose, Bld: 83 mg/dL (ref 65–99)
POTASSIUM: 4 mmol/L (ref 3.5–5.1)
SODIUM: 142 mmol/L (ref 135–145)

## 2017-04-10 LAB — PREPARE RBC (CROSSMATCH)

## 2017-04-10 NOTE — Care Management (Signed)
Patient is not a [facility] of [facility] [place] patient. Confirmed with Flo Shanks. Patient was discharged from hospice several weeks ago.

## 2017-04-10 NOTE — Progress Notes (Signed)
[facility] NAME: Mercedes Rodriguez    MR#:  [id number]  DATE OF BIRTH:  1929/07/23  SUBJECTIVE:  CHIEF COMPLAINT:   Chief Complaint  Patient presents with  . Shortness of Breath   Came with hemoglobin 4 with a dark stool and generalized weakness.  4 units of blood transfusion was given and feels much stronger today. The The morning, she was n.p.o. and was waiting for the seizure to happen.  REVIEW OF SYSTEMS:  CONSTITUTIONAL: No fever, fatigue or weakness.  EYES: No blurred or double vision.  EARS, NOSE, AND THROAT: No tinnitus or ear pain.  RESPIRATORY: No cough, shortness of breath, wheezing or hemoptysis.  CARDIOVASCULAR: No chest pain, orthopnea, edema.  GASTROINTESTINAL: No nausea, vomiting, diarrhea or abdominal pain.  GENITOURINARY: No dysuria, hematuria.  ENDOCRINE: No polyuria, nocturia,  HEMATOLOGY: No anemia, easy bruising or bleeding SKIN: No rash or lesion. MUSCULOSKELETAL: No joint pain or arthritis.   NEUROLOGIC: No tingling, numbness, weakness.  PSYCHIATRY: No anxiety or depression.   ROS  DRUG ALLERGIES:  No Known Allergies  VITALS:  Blood pressure 139/63, pulse (!) 59, temperature 98.5 F (36.9 C), temperature source Oral, resp. rate 20, height 5\' 4"  (1.626 m), weight 72 kg (158 lb 11.7 oz), SpO2 100 %.  PHYSICAL EXAMINATION:  GENERAL:  84 y.o.-year-old patient lying in the bed with no acute distress.  EYES: Pupils equal, round, reactive to light and accommodation. No scleral icterus. Extraocular muscles intact.  HEENT: Head atraumatic, normocephalic. Oropharynx and nasopharynx clear.  NECK:  Supple, no jugular venous distention. No thyroid enlargement, no tenderness.  LUNGS: Normal breath sounds bilaterally, no wheezing, rales,rhonchi or crepitation. No use of accessory muscles of respiration.  CARDIOVASCULAR: S1, S2 normal. No murmurs, rubs, or gallops.  ABDOMEN: Soft, nontender, nondistended. Bowel  sounds present. No organomegaly or mass.  EXTREMITIES: No pedal edema, cyanosis, or clubbing.  NEUROLOGIC: Cranial nerves II through XII are intact. Muscle strength 5/5 in all extremities. Sensation intact. Gait not checked.  PSYCHIATRIC: The patient is alert and oriented x 3.  SKIN: No obvious rash, lesion, or ulcer.   Physical Exam LABORATORY PANEL:   CBC Recent Labs  Lab 04/10/17 1627  WBC 5.9  HGB 10.2*  HCT 31.6*  PLT 177   ------------------------------------------------------------------------------------------------------------------  Chemistries  Recent Labs  Lab 04/10/17 0902  NA 142  K 4.0  CL 112*  CO2 25  GLUCOSE 83  BUN 42*  CREATININE 2.03*  CALCIUM 8.0*   ------------------------------------------------------------------------------------------------------------------  Cardiac Enzymes Recent Labs  Lab 04/09/17 1135  TROPONINI <0.03   ------------------------------------------------------------------------------------------------------------------  RADIOLOGY:  Dg Chest 2 View  Result Date: 04/09/2017 CLINICAL DATA:  Worsening shortness of breath over the last week. History of congestive heart failure. EXAM: CHEST - 2 VIEW COMPARISON:  02/20/2017.  01/03/2017. FINDINGS: Cardiomegaly with left ventricular prominence. Tortuous aorta. Chronic widespread pulmonary scarring. No sign of active infiltrate, mass, effusion or collapse. Both mild edema and mild pneumonia could be hidden within the chronic markings. No acute bone finding. IMPRESSION: Cardiomegaly and chronic pulmonary scarring. No active process identified, though low level edema or pneumonia could be hidden within the chronic markings. Electronically Signed   By: Nelson Chimes M.D.   On: 04/09/2017 12:27    ASSESSMENT AND PLAN:   Active Problems:   GIB (gastrointestinal bleeding)  1 acute GI bleeding/melena Compounded by Xarelto use for A. Fib  avoid NSAIDs/antiplatelet medications,  gastroenterology to see, transfuse 4 units packed red  blood cells, CBC daily, Protonix drip, and continue close medical monitoring I spoke to GI physician, he is to do endoscopy and colonoscopy on Sunday, as patient took her last dose of Xarelto 2 days ago.  2 acute symptomatic anemia  secondary to above  Plan of care per above  3 acute kidney injury, chronic kidney disease stage III Exacerbated by acute blood loss anemia Avoid nephrotoxic agents, strict I&O monitoring, daily weights, consult nephrology for expert opinion, transfusion as stated above, BMP daily  4 chronic diastolic congestive heart failure without exacerbation Previously on hospice Hold Lasix for now given acute kidney injury/volume depletion, continue metoprolol, avoid antiplatelet/anticoagulants stated above  5 chronic A. Fib Stable Continue metoprolol for rate control Xarelto discontinued given acute GI bleeding    All the records are reviewed and case discussed with Care Management/Social Workerr. Management plans discussed with the patient, family and they are in agreement.  CODE STATUS: DNR  TOTAL TIME TAKING CARE OF THIS PATIENT: 1-2 minutes.     POSSIBLE D/C IN 1-2 DAYS, DEPENDING ON CLINICAL CONDITION.   Vaughan Basta M.D on 04/10/2017   Between 7am to 6pm - Pager - [phone]  After 6pm go to [email or web address] - password [email or web address] [facility]  Office  [phone]  CC: Primary care physician; Lynnell Jude, MD  Note: This dictation was prepared with Dragon dictation along with smaller phrase technology. Any transcriptional errors that result from this process are unintentional.

## 2017-04-10 NOTE — Progress Notes (Signed)
[facility]  Indication: GI bleed and on Xarelto   No Known Allergies  Patient Measurements: Height: 5\' 4"  (162.6 cm) Weight: 158 lb 11.7 oz (72 kg) IBW/kg (Calculated) : 54.7  Vital Signs: Temp: 97.8 F (36.6 C) (04/05 0815) Temp Source: Oral (04/05 0815) BP: 146/64 (04/05 0815) Pulse Rate: 51 (04/05 0815) Intake/Output from previous day: 04/04 0701 - 04/05 0700 In: 1620 [I.V.:331; Blood:1289] Out: 650 [Urine:650] Intake/Output from this shift: Total I/O In: 317 [Blood:317] Out: -   Labs: Recent Labs    04/09/17 1135 04/09/17 1248 04/09/17 2332 04/10/17 0902  WBC 7.3  --   --  4.7  HGB 4.0*  --  5.8* 8.5*  HCT 13.3*  --  18.9* 26.5*  PLT 308  --   --  189  APTT  --  36  --   --   CREATININE 2.38*  --   --  2.03*   Estimated Creatinine Clearance: 19.7 mL/min (A) (by C-G formula based on SCr of 2.03 mg/dL (H)).   Microbiology: No results found for this or any previous visit (from the past 720 hour(s)).  Medical History: Past Medical History:  Diagnosis Date  . Arrhythmia   . Atrial fibrillation ([place])   . CHF (congestive heart failure) ([place])   . Hearing loss   . Hypertension     Assessment: 84 year old female presents to ED with melana/GI bleeding on Xarelto chronically at home for AFib.   Baseline;  INR: 4.11 PT: 39.5  Hgb: 4.0   Goal of Therapy:  Normalize anticoagulation factors.   Plan:  4/5@902  INR 1.61, Hgb 8.5, PT 19.0. Continue monitoring inr q6h x 4 per protocol  Will order CBC and INR for 4/5@1600    Donna Christen Iram Astorino, PharmD, MBA, [employer] Clinical Pharmacist [facility]   04/10/2017,10:38 AM

## 2017-04-10 NOTE — Consult Note (Signed)
[facility]  CONSULT NOTE    Date: 04/10/2017                  Patient Name:  Mercedes Rodriguez  MRN: [id number]  DOB: 1929/01/30  Age / Sex: 84 y.o., female         PCP: Lynnell Jude, MD                 Service Requesting Consult: Dr. Jerelyn Charles                 Reason for Consult: Acute Renal Failure            History of Present Illness: Mercedes Rodriguez is a 84 y.o. black female with hypertension, congestive heart failure, atrial fibrillation on xarelto, who was admitted to [facility] on 04/09/2017 for Melena [K92.1] SOB (shortness of breath) [R06.02]  Patient found to have a hemoglobin of 4 and transfused 4 units PRBC. She endorses melana. Denies use of NSAIDs. INR was 4.11.   Creatinine was 2.28 on admission from 1.9 on previous admission: 02/23/17 1.9 GFR of 23.    Medications: Outpatient medications: Medications Prior to Admission  Medication Sig Dispense Refill Last Dose  . acetaminophen (TYLENOL) 325 MG tablet Take 2 tablets (650 mg total) by mouth every 6 (six) hours as needed for mild pain (or Fever >/= 101).   PRN at PRN  . carbamide peroxide (DEBROX) 6.5 % OTIC solution Place 4 drops into both ears 2 (two) times daily.   Past Week at prn  . furosemide (LASIX) 40 MG tablet Take 1 tablet (40 mg total) by mouth daily. (Patient taking differently: Take 20 mg by mouth daily. ) 30 tablet  04/09/2017 at Unknown time  . haloperidol (HALDOL) 1 MG tablet Take 1 mg by mouth every 6 (six) hours as needed for agitation.   prn at prn  . hyoscyamine (LEVSIN, ANASPAZ) 0.125 MG tablet Take 0.125 mg by mouth. Every 4 to 6 hours as needed [secretions]   prn at prn  . LORazepam (ATIVAN) 0.5 MG tablet Take 0.5 mg by mouth every 4 (four) hours as needed for anxiety.   prn at prn  . metoprolol succinate (TOPROL-XL) 100 MG 24 hr tablet Take 100 mg by mouth daily. Take with or immediately following a meal.   04/09/2017 at Unknown time  . Morphine Sulfate (MORPHINE CONCENTRATE) 10 mg  / 0.5 ml concentrated solution Take 5 mg by mouth every hour as needed for severe pain or shortness of breath.   prn at prn  . potassium chloride SA (K-DUR,KLOR-CON) 20 MEQ tablet Take 1 tablet (20 mEq total) by mouth daily.   04/09/2017 at Unknown time  . ranitidine (ZANTAC) 150 MG tablet Take 150 mg by mouth 2 (two) times daily as needed for heartburn.   Past Week at Unknown time  . Rivaroxaban (XARELTO) 15 MG TABS tablet Take 1 tablet (15 mg total) by mouth daily with supper. 30 tablet 0 04/09/2017 at Unknown time  . traZODone (DESYREL) 100 MG tablet Take 1 tablet (100 mg total) by mouth at bedtime as needed for sleep. (Patient taking differently: Take 100 mg by mouth at bedtime. ) 30 tablet 0 Past Week at Unknown time  . albuterol (PROVENTIL) (2.5 MG/3ML) 0.083% nebulizer solution Take 3 mLs (2.5 mg total) by nebulization every 6 (six) hours as needed for wheezing or shortness of breath. (Patient not taking: Reported on 03/30/2017) 75 mL 12 Not Taking at  Unknown time    Current medications: Current Facility-Administered Medications  Medication Dose Route Frequency Provider Last Rate Last Dose  . acetaminophen (TYLENOL) tablet 650 mg  650 mg Oral Q6H PRN Salary, Montell D, MD       Or  . acetaminophen (TYLENOL) suppository 650 mg  650 mg Rectal Q6H PRN Salary, Montell D, MD      . albuterol (PROVENTIL) (2.5 MG/3ML) 0.083% nebulizer solution 2.5 mg  2.5 mg Nebulization Q6H PRN Salary, Montell D, MD      . dextrose 5 %-0.45 % sodium chloride infusion   Intravenous Continuous Salary, Montell D, MD 75 mL/hr at 04/10/17 1404    . docusate sodium (COLACE) capsule 100 mg  100 mg Oral BID Salary, Montell D, MD   100 mg at 04/10/17 1048  . furosemide (LASIX) injection 20 mg  20 mg Intravenous PRN Salary, Montell D, MD      . HYDROcodone-acetaminophen (NORCO/VICODIN) 5-325 MG per tablet 1-2 tablet  1-2 tablet Oral Q4H PRN Salary, Montell D, MD      . metoprolol succinate (TOPROL-XL) 24 hr tablet 100 mg  100  mg Oral Daily Salary, Avel Peace, MD   Stopped at 04/10/17 [phone]  . ondansetron (ZOFRAN) tablet 4 mg  4 mg Oral Q6H PRN Salary, Montell D, MD       Or  . ondansetron (ZOFRAN) injection 4 mg  4 mg Intravenous Q6H PRN Salary, Montell D, MD      . pantoprazole (PROTONIX) 80 mg in sodium chloride 0.9 % 250 mL (0.32 mg/mL) infusion  8 mg/hr Intravenous Continuous Nance Pear, MD 25 mL/hr at 04/09/17 1852 8 mg/hr at 04/09/17 1852  . pantoprazole (PROTONIX) 80 mg in sodium chloride 0.9 % 250 mL (0.32 mg/mL) infusion  8 mg/hr Intravenous Continuous Salary, Montell D, MD 25 mL/hr at 04/10/17 1404 8 mg/hr at 04/10/17 1404  . [START ON 04/13/2017] pantoprazole (PROTONIX) injection 40 mg  40 mg Intravenous Q12H Nance Pear, MD      . Derrill Memo ON 04/13/2017] pantoprazole (PROTONIX) injection 40 mg  40 mg Intravenous Q12H Salary, Montell D, MD      . polyethylene glycol (MIRALAX / GLYCOLAX) packet 17 g  17 g Oral Daily PRN Salary, Avel Peace, MD          Allergies: No Known Allergies    Past Medical History: Past Medical History:  Diagnosis Date  . Arrhythmia   . Atrial fibrillation ([place])   . CHF (congestive heart failure) ([place])   . Hearing loss   . Hypertension      Past Surgical History: History reviewed. No pertinent surgical history.   Family History: History reviewed. No pertinent family history.   Social History: Social History   Socioeconomic History  . Marital status: Widowed    Spouse name: Not on file  . Number of children: Not on file  . Years of education: Not on file  . Highest education level: Not on file  Occupational History  . Not on file  Social Needs  . Financial resource strain: Not on file  . Food insecurity:    Worry: Not on file    Inability: Not on file  . Transportation needs:    Medical: Not on file    Non-medical: Not on file  Tobacco Use  . Smoking status: Former Smoker    Types: Cigarettes    Last attempt to quit: 01/06/1993    Years since  quitting: 24.2  . Smokeless tobacco: Never Used  Substance and Sexual Activity  . Alcohol use: No  . Drug use: Not on file  . Sexual activity: Not Currently  Lifestyle  . Physical activity:    Days per week: Not on file    Minutes per session: Not on file  . Stress: Not on file  Relationships  . Social connections:    Talks on phone: Not on file    Gets together: Not on file    Attends religious service: Not on file    Active member of club or organization: Not on file    Attends meetings of clubs or organizations: Not on file    Relationship status: Not on file  . Intimate partner violence:    Fear of current or ex partner: Not on file    Emotionally abused: Not on file    Physically abused: Not on file    Forced sexual activity: Not on file  Other Topics Concern  . Not on file  Social History Narrative  . Not on file     Review of Systems: Review of Systems  Constitutional: Positive for malaise/fatigue. Negative for chills, diaphoresis, fever and weight loss.  HENT: Negative.  Negative for congestion, ear discharge, ear pain, hearing loss, nosebleeds, sinus pain, sore throat and tinnitus.   Eyes: Negative.  Negative for blurred vision, double vision, photophobia, pain, discharge and redness.  Respiratory: Positive for shortness of breath. Negative for cough, hemoptysis, sputum production, wheezing and stridor.   Cardiovascular: Negative.  Negative for chest pain, palpitations, orthopnea, claudication, leg swelling and PND.  Gastrointestinal: Positive for melena. Negative for abdominal pain, blood in stool, constipation, diarrhea, heartburn, nausea and vomiting.  Genitourinary: Negative.  Negative for dysuria, flank pain, frequency, hematuria and urgency.  Musculoskeletal: Negative.  Negative for back pain, falls, joint pain, myalgias and neck pain.  Skin: Negative.  Negative for itching and rash.  Neurological: Negative.  Negative for dizziness, tingling, tremors, sensory  change, speech change, focal weakness, seizures, loss of consciousness, weakness and headaches.  Endo/Heme/Allergies: Negative.  Negative for environmental allergies and polydipsia. Does not bruise/bleed easily.  Psychiatric/Behavioral: Negative.  Negative for depression, hallucinations, memory loss, substance abuse and suicidal ideas. The patient is not nervous/anxious and does not have insomnia.     Vital Signs: Blood pressure (!) 138/55, pulse (!) 47, temperature 97.9 F (36.6 C), temperature source Oral, resp. rate 20, height 5\' 4"  (1.626 m), weight 72 kg (158 lb 11.7 oz), SpO2 91 %.  Weight trends: Filed Weights   04/09/17 1125 04/10/17 0500  Weight: 74.8 kg (165 lb) 72 kg (158 lb 11.7 oz)    Physical Exam: General: NAD, laying in bed  Head: Normocephalic, atraumatic. Moist oral mucosal membranes  Eyes: Anicteric, PERRL  Neck: Supple, trachea midline  Lungs:  Clear to auscultation  Heart: Regular rate and rhythm  Abdomen:  Soft, nontender,   Extremities: no peripheral edema.  Neurologic: Nonfocal, moving all four extremities  Skin: No lesions        Lab results: Basic Metabolic Panel: Recent Labs  Lab 04/09/17 1135 04/10/17 0902  NA 140 142  K 4.4 4.0  CL 108 112*  CO2 22 25  GLUCOSE 127* 83  BUN 51* 42*  CREATININE 2.38* 2.03*  CALCIUM 8.3* 8.0*    Liver Function Tests: No results for input(s): AST, ALT, ALKPHOS, BILITOT, PROT, ALBUMIN in the last 168 hours. No results for input(s): LIPASE, AMYLASE in the last 168 hours. No results for input(s): AMMONIA in the last 168  hours.  CBC: Recent Labs  Lab 04/09/17 1135 04/09/17 2332 04/10/17 0902 04/10/17 1627  WBC 7.3  --  4.7 5.9  HGB 4.0* 5.8* 8.5* 10.2*  HCT 13.3* 18.9* 26.5* 31.6*  MCV 75.3*  --  81.3 83.3  PLT 308  --  189 177    Cardiac Enzymes: Recent Labs  Lab 04/09/17 1135  TROPONINI <0.03    BNP: Invalid input(s): POCBNP  CBG: No results for input(s): GLUCAP in the last 168  hours.  Microbiology: Results for orders placed or performed during the hospital encounter of 12/31/16  Blood culture (routine x 2)     Status: Abnormal   Collection Time: 12/31/16  9:15 AM  Result Value Ref Range Status   Specimen Description   Final    BLOOD LEFT ANTECUBITAL Performed at [facility], [address] [place], [place] [address]    Special Requests   Final    BOTTLES DRAWN AEROBIC AND ANAEROBIC Blood Culture adequate volume Performed at [facility], [address], [place], [place] [address]    Culture  Setup Time   Final    GRAM NEGATIVE RODS IN BOTH AEROBIC AND ANAEROBIC BOTTLES CRITICAL RESULT CALLED TO, READ BACK BY AND VERIFIED WITH: SHEEMA HALLAJI ON 01/01/17 AT 31 QSD     Culture (A)  Final    HAEMOPHILUS INFLUENZAE NON TYPABLE BIOTYPE II BETA LACTAMASE NEGATIVE HEALTH DEPARTMENT NOTIFIED RESULTS BY [facility] IN [place], [place] Performed at [facility], [place] [address] [place], [place] [address]    Report Status 01/27/2017 FINAL  Final  Blood Culture ID Panel (Reflexed)     Status: Abnormal   Collection Time: 12/31/16  9:15 AM  Result Value Ref Range Status   Enterococcus species NOT DETECTED NOT DETECTED Final   Listeria monocytogenes NOT DETECTED NOT DETECTED Final   Staphylococcus species NOT DETECTED NOT DETECTED Final   Staphylococcus aureus NOT DETECTED NOT DETECTED Final   Streptococcus species NOT DETECTED NOT DETECTED Final   Streptococcus agalactiae NOT DETECTED NOT DETECTED Final   Streptococcus pneumoniae NOT DETECTED NOT DETECTED Final   Streptococcus pyogenes NOT DETECTED NOT DETECTED Final   Acinetobacter baumannii NOT DETECTED NOT DETECTED Final   Enterobacteriaceae species NOT DETECTED NOT DETECTED Final   Enterobacter cloacae complex NOT DETECTED NOT DETECTED Final   Escherichia coli NOT DETECTED NOT DETECTED Final   Klebsiella oxytoca NOT DETECTED NOT DETECTED Final   Klebsiella pneumoniae  NOT DETECTED NOT DETECTED Final   Proteus species NOT DETECTED NOT DETECTED Final   Serratia marcescens NOT DETECTED NOT DETECTED Final   Haemophilus influenzae DETECTED (A) NOT DETECTED Final    Comment: CRITICAL RESULT CALLED TO, READ BACK BY AND VERIFIED WITH: SHEEMA HALLAJI ON 01/01/17 AT 1153 QSD    Neisseria meningitidis NOT DETECTED NOT DETECTED Final   Pseudomonas aeruginosa NOT DETECTED NOT DETECTED Final   Candida albicans NOT DETECTED NOT DETECTED Final   Candida glabrata NOT DETECTED NOT DETECTED Final   Candida krusei NOT DETECTED NOT DETECTED Final   Candida parapsilosis NOT DETECTED NOT DETECTED Final   Candida tropicalis NOT DETECTED NOT DETECTED Final    Comment: Performed at [facility], [address] [place], [place] [address]  Blood culture (routine x 2)     Status: None   Collection Time: 12/31/16 10:00 AM  Result Value Ref Range Status   Specimen Description BLOOD RIGHT ANTECUBITAL  Final   Special Requests   Final  BOTTLES DRAWN AEROBIC AND ANAEROBIC Blood Culture results may not be optimal due to an excessive volume of blood received in culture bottles   Culture   Final    NO GROWTH 5 DAYS Performed at [facility], [address] [place], [place] [address]    Report Status 01/05/2017 FINAL  Final  MRSA PCR Screening     Status: None   Collection Time: 01/01/17  8:59 PM  Result Value Ref Range Status   MRSA by PCR NEGATIVE NEGATIVE Final    Comment:        The GeneXpert MRSA Assay (FDA approved for NASAL specimens only), is one component of a comprehensive MRSA colonization surveillance program. It is not intended to diagnose MRSA infection nor to guide or monitor treatment for MRSA infections. Performed at [facility], [address] [place], [place] [address]   CULTURE, BLOOD (ROUTINE X 2) w Reflex to ID Panel     Status: None   Collection Time: 01/02/17  5:30 AM  Result Value Ref Range Status   Specimen  Description BLOOD RIGHT HAND  Final   Special Requests   Final    BOTTLES DRAWN AEROBIC AND ANAEROBIC Blood Culture adequate volume   Culture   Final    NO GROWTH 5 DAYS Performed at [facility], [address] [place], [place] [address]    Report Status 01/07/2017 FINAL  Final  CULTURE, BLOOD (ROUTINE X 2) w Reflex to ID Panel     Status: None   Collection Time: 01/02/17  5:30 AM  Result Value Ref Range Status   Specimen Description BLOOD LEFT HAND  Final   Special Requests   Final    BOTTLES DRAWN AEROBIC AND ANAEROBIC Blood Culture results may not be optimal due to an inadequate volume of blood received in culture bottles   Culture   Final    NO GROWTH 5 DAYS Performed at [facility], [address] [place], [place] [address]    Report Status 01/07/2017 FINAL  Final    Coagulation Studies: Recent Labs    04/09/17 1248 04/10/17 0902  LABPROT 39.5* 19.0*  INR 4.11* 1.61    Urinalysis: No results for input(s): COLORURINE, LABSPEC, PHURINE, GLUCOSEU, HGBUR, BILIRUBINUR, KETONESUR, PROTEINUR, UROBILINOGEN, NITRITE, LEUKOCYTESUR in the last 72 hours.  Invalid input(s): APPERANCEUR    Imaging: Dg Chest 2 View  Result Date: 04/09/2017 CLINICAL DATA:  Worsening shortness of breath over the last week. History of congestive heart failure. EXAM: CHEST - 2 VIEW COMPARISON:  02/20/2017.  01/03/2017. FINDINGS: Cardiomegaly with left ventricular prominence. Tortuous aorta. Chronic widespread pulmonary scarring. No sign of active infiltrate, mass, effusion or collapse. Both mild edema and mild pneumonia could be hidden within the chronic markings. No acute bone finding. IMPRESSION: Cardiomegaly and chronic pulmonary scarring. No active process identified, though low level edema or pneumonia could be hidden within the chronic markings. Electronically Signed   By: Nelson Chimes M.D.   On: 04/09/2017 12:27      Assessment & Plan: Mercedes Rodriguez is a 84 y.o. black  female with hypertension, congestive heart failure, atrial fibrillation on xarelto, who was admitted to [facility] on 04/09/2017 for Melena [K92.1] SOB (shortness of breath) [R06.02]  1. Acute renal failure 2. Chronic kidney disease stage IV: baseline creatinine of 1.9, GFR of 23 on 02/23/17 3. Anemia from GI bleed: status post 4 units PRBC 4. Hypercoaguable state 5. Congestive heart failure not in exacerbation  Impression Acute  renal failure secondary to acute anemia causing ATN Chronic kidney disease seems to be secondary to cardiorenal syndrome Bland Urine Continue IV fluids. Hemodynamically stable.      LOS: 1 Elige Shouse 4/5/20194:55 PM

## 2017-04-10 NOTE — Consult Note (Signed)
GI Inpatient Consult Note  Reason for Consult: Melena   Attending Requesting Consult: Dr. Vaughan Basta  History of Present Illness: Mercedes Rodriguez is a 84 y.o. female seen for evaluation of acute melena for at least the past two days at the request of Dr. Vaughan Basta. Patient originally admitted yesterday for increasing shortness of breath, found to be hemoccult positive in the ED. Patient on Xarelto for atrial fibrillation. Hemoglobin yesterday was 4.0 and 5.8, pt received 4 units PRBCs. Her baseline hemoglobin is around 11.  Hemoglobin today 8.5 and patient currently on protonix drip.   Patient seen and examined bedside this morning in no acute distress. She endorses mild epigastric discomfort for the past week that she rates 4/10. Pain does not radiate and she denies any alleviating/aggravating factors. She lives with her son, [name], who states his mom has not been taken her medication as prescribed recently. He states his mom has been grieving the loss of her daughter since December and hasn't been right since this time. He states yesterday he noticed dark tarry stools his mother was having. No BMs since admission. Patient denies any fever, nausea, vomiting, dysphagia, lower abdominal pain, changes in bowel habits. Denies frequent NSAID use, denies tobacco/alcohol use. Denies family hx of gastric/esophageal cancer. She had appt scheduled with Dr. Nehemiah Massed yesterday, but was unable to make it due to being transported to the ED.    Last Colonoscopy/Endoscopy: Patient denies ever having done   Past Medical History:  Past Medical History:  Diagnosis Date  . Arrhythmia   . Atrial fibrillation ([place])   . CHF (congestive heart failure) ([place])   . Hearing loss   . Hypertension     Problem List: Patient Active Problem List   Diagnosis Date Noted  . GIB (gastrointestinal bleeding) 04/09/2017  . Bradycardia 02/21/2017  . Sinus bradycardia 02/20/2017  . HTN (hypertension)  01/29/2017  . Hard of hearing 01/21/2017  . Atrial fibrillation with RVR ([place])   . Palliative care encounter   . CHF (congestive heart failure) ([place]) 12/31/2016    Past Surgical History: History reviewed. No pertinent surgical history.  Allergies: No Known Allergies  Home Medications: Medications Prior to Admission  Medication Sig Dispense Refill Last Dose  . acetaminophen (TYLENOL) 325 MG tablet Take 2 tablets (650 mg total) by mouth every 6 (six) hours as needed for mild pain (or Fever >/= 101).   Taking  . albuterol (PROVENTIL) (2.5 MG/3ML) 0.083% nebulizer solution Take 3 mLs (2.5 mg total) by nebulization every 6 (six) hours as needed for wheezing or shortness of breath. (Patient not taking: Reported on 03/30/2017) 75 mL 12 Not Taking  . carbamide peroxide (DEBROX) 6.5 % OTIC solution Place 4 drops into both ears 2 (two) times daily.   Taking  . furosemide (LASIX) 40 MG tablet Take 1 tablet (40 mg total) by mouth daily. 30 tablet  Taking  . haloperidol (HALDOL) 1 MG tablet Take 1 mg by mouth every 6 (six) hours as needed for agitation.   Not Taking  . hyoscyamine (LEVSIN, ANASPAZ) 0.125 MG tablet Take 0.125 mg by mouth. Every 4 to 6 hours as needed [secretions]   Not Taking  . LORazepam (ATIVAN) 0.5 MG tablet Take 0.5 mg by mouth every 4 (four) hours as needed for anxiety.   Not Taking  . metoprolol succinate (TOPROL-XL) 100 MG 24 hr tablet Take 100 mg by mouth daily. Take with or immediately following a meal.   Taking  . Morphine Sulfate (MORPHINE  CONCENTRATE) 10 mg / 0.5 ml concentrated solution Take 5 mg by mouth every hour as needed for severe pain or shortness of breath.   Not Taking  . potassium chloride SA (K-DUR,KLOR-CON) 20 MEQ tablet Take 1 tablet (20 mEq total) by mouth daily.   Taking  . ranitidine (ZANTAC) 150 MG tablet Take 150 mg by mouth 2 (two) times daily as needed for heartburn.   Taking  . Rivaroxaban (XARELTO) 15 MG TABS tablet Take 1 tablet (15 mg total) by mouth  daily with supper. 30 tablet 0 Taking  . traZODone (DESYREL) 100 MG tablet Take 1 tablet (100 mg total) by mouth at bedtime as needed for sleep. 30 tablet 0 Taking   Home medication reconciliation was completed with the patient.   Scheduled Inpatient Medications:   . docusate sodium  100 mg Oral BID  . metoprolol succinate  100 mg Oral Daily  . [START ON 04/13/2017] pantoprazole  40 mg Intravenous Q12H  . [START ON 04/13/2017] pantoprazole  40 mg Intravenous Q12H    Continuous Inpatient Infusions:   . dextrose 5 % and 0.45% NaCl    . pantoprozole (PROTONIX) infusion 8 mg/hr (04/09/17 1852)  . pantoprozole (PROTONIX) infusion 8 mg/hr (04/10/17 0158)    PRN Inpatient Medications:  acetaminophen **OR** acetaminophen, albuterol, furosemide, HYDROcodone-acetaminophen, ondansetron **OR** ondansetron (ZOFRAN) IV, polyethylene glycol  Family History: family history is not on file.  The patient's family history is negative for inflammatory bowel disorders, GI malignancy, or solid organ transplantation.  Social History:   reports that she quit smoking about 24 years ago. Her smoking use included cigarettes. She has never used smokeless tobacco. She reports that she does not drink alcohol. The patient denies ETOH, tobacco, or drug use.   Review of Systems: Constitutional: Weight is stable.  Eyes: No changes in vision. ENT: No oral lesions, sore throat.  GI: see HPI.  Heme/Lymph: No easy bruising.  CV: No chest pain.  GU: No hematuria.  Integumentary: No rashes.  Neuro: No headaches.  Psych: No depression/anxiety.  Endocrine: No heat/cold intolerance.  Allergic/Immunologic: No urticaria.  Resp: No cough, SOB.  Musculoskeletal: No joint swelling.    Physical Examination: BP (!) 146/64   Pulse (!) 51   Temp 97.8 F (36.6 C) (Oral)   Resp 20   Ht 5\' 4"  (1.626 m)   Wt 72 kg (158 lb 11.7 oz)   SpO2 100%   BMI 27.25 kg/m   Friendly, non-toxic appearing elderly female in hospital  bed. Son in room and helps answer questions. Gen: NAD, alert and oriented x 4 HEENT: PEERLA, EOMI, Neck: supple, no JVD or thyromegaly Chest: CTA bilaterally, no wheezes, crackles, or other adventitious sounds CV: RRR, no m/g/c/r Abd: soft, nondistended, mild epigastric tenderness, +BS in all four quadrants; no HSM, guarding, ridigity, or rebound tenderness Ext: no edema, well perfused with 2+ pulses, Skin: no rash or lesions noted Lymph: no LAD  Data: Lab Results  Component Value Date   WBC 4.7 04/10/2017   HGB 8.5 (L) 04/10/2017   HCT 26.5 (L) 04/10/2017   MCV 81.3 04/10/2017   PLT 189 04/10/2017   Recent Labs  Lab 04/09/17 1135 04/09/17 2332 04/10/17 0902  HGB 4.0* 5.8* 8.5*   Lab Results  Component Value Date   NA 142 04/10/2017   K 4.0 04/10/2017   CL 112 (H) 04/10/2017   CO2 25 04/10/2017   BUN 42 (H) 04/10/2017   CREATININE 2.03 (H) 04/10/2017   Lab Results  Component Value Date   ALT 21 12/31/2016   AST 49 (H) 12/31/2016   ALKPHOS 84 12/31/2016   BILITOT 1.8 (H) 12/31/2016   Recent Labs  Lab 04/09/17 1248 04/10/17 0902  APTT 36  --   INR 4.11* 1.61   Assessment/Plan: Ms. Fulop is a 84 y.o. female with a PMH of CHF, HTN, and atrial fibrillation on Xarelto evaluated for melena.   1. Melena: - Plan for endoscopy/colonoscopy for Sunday due to patient taking her last dose of Xarelto yesterday morning - Continue to monitor Hemoglobin daily - Continue Protonix drip - Hold Xarelto and all anticoagulation/antiplatelet  - Discussed risks of procedures, including but not limited to bleeding, infection, small puncture to the esophagus or colon, or problems with anesthesia, and benefits. Patient agrees to procedures. Denies chest pain/SOB.  EGD/Colonoscopy planned for Sunday    Thank you for the consult. Please call with questions or concerns.  Geanie Kenning, PA-C [place]

## 2017-04-11 LAB — CBC
HEMATOCRIT: 24.9 % — AB (ref 35.0–47.0)
HEMOGLOBIN: 8.2 g/dL — AB (ref 12.0–16.0)
MCH: 26.5 pg (ref 26.0–34.0)
MCHC: 33 g/dL (ref 32.0–36.0)
MCV: 80.4 fL (ref 80.0–100.0)
Platelets: 168 10*3/uL (ref 150–440)
RBC: 3.1 MIL/uL — ABNORMAL LOW (ref 3.80–5.20)
RDW: 17.1 % — ABNORMAL HIGH (ref 11.5–14.5)
WBC: 5.3 10*3/uL (ref 3.6–11.0)

## 2017-04-11 LAB — BASIC METABOLIC PANEL
Anion gap: 3 — ABNORMAL LOW (ref 5–15)
BUN: 30 mg/dL — ABNORMAL HIGH (ref 6–20)
CHLORIDE: 110 mmol/L (ref 101–111)
CO2: 23 mmol/L (ref 22–32)
CREATININE: 1.64 mg/dL — AB (ref 0.44–1.00)
Calcium: 7.3 mg/dL — ABNORMAL LOW (ref 8.9–10.3)
GFR calc non Af Amer: 27 mL/min — ABNORMAL LOW (ref >60)
GFR, EST AFRICAN AMERICAN: 32 mL/min — AB (ref >60)
Glucose, Bld: 86 mg/dL (ref 65–99)
Potassium: 3.9 mmol/L (ref 3.5–5.1)
Sodium: 136 mmol/L (ref 135–145)

## 2017-04-11 LAB — PREPARE RBC (CROSSMATCH)

## 2017-04-11 MED ORDER — BISACODYL 5 MG PO TBEC
20.0000 mg | DELAYED_RELEASE_TABLET | Freq: Once | ORAL | Status: AC
  Administered 2017-04-11: 20 mg via ORAL
  Filled 2017-04-11: qty 4

## 2017-04-11 MED ORDER — SODIUM CHLORIDE 0.9 % IV SOLN
INTRAVENOUS | Status: DC
  Administered 2017-04-11: 19:00:00 via INTRAVENOUS

## 2017-04-11 MED ORDER — METOPROLOL SUCCINATE ER 50 MG PO TB24
50.0000 mg | ORAL_TABLET | Freq: Once | ORAL | Status: DC
  Filled 2017-04-11: qty 1

## 2017-04-11 MED ORDER — PEG 3350-KCL-NA BICARB-NACL 420 G PO SOLR
4000.0000 mL | Freq: Once | ORAL | Status: AC
  Administered 2017-04-11: 4000 mL via ORAL
  Filled 2017-04-11: qty 4000

## 2017-04-11 NOTE — Progress Notes (Signed)
[place] [place] NAME: Mercedes Rodriguez    MR#:  [id number]  DATE OF BIRTH:  1929/10/13  SUBJECTIVE:   Patient is here due to shortness of breath and noted to have symptomatic anemia.  Plan for upper GI endoscopy and colonoscopy tomorrow.  Patient had no acute bleeding overnight.  Hemoglobin improved posttransfusion and currently stable.  REVIEW OF SYSTEMS:    Review of Systems  Constitutional: Negative for chills and fever.  HENT: Negative for congestion and tinnitus.   Eyes: Negative for blurred vision and double vision.  Respiratory: Negative for cough, shortness of breath and wheezing.   Cardiovascular: Negative for chest pain, orthopnea and PND.  Gastrointestinal: Negative for abdominal pain, diarrhea, nausea and vomiting.  Genitourinary: Negative for dysuria and hematuria.  Neurological: Negative for dizziness, sensory change and focal weakness.  All other systems reviewed and are negative.   Nutrition: Clear liquid Tolerating Diet: Yes Tolerating PT: Await Eval.   DRUG ALLERGIES:  No Known Allergies  VITALS:  Blood pressure (!) 145/48, pulse (!) 55, temperature 98.1 F (36.7 C), temperature source Oral, resp. rate 18, height 5\' 4"  (1.626 m), weight 72 kg (158 lb 11.7 oz), SpO2 100 %.  PHYSICAL EXAMINATION:   Physical Exam  GENERAL:  84 y.o.-year-old patient lying in bed in no acute distress.  EYES: Pupils equal, round, reactive to light and accommodation. No scleral icterus. Extraocular muscles intact.  HEENT: Head atraumatic, normocephalic. Oropharynx and nasopharynx clear.  NECK:  Supple, no jugular venous distention. No thyroid enlargement, no tenderness.  LUNGS: Normal breath sounds bilaterally, no wheezing, rales, rhonchi. No use of accessory muscles of respiration.  CARDIOVASCULAR: S1, S2 normal. No murmurs, rubs, or gallops.  ABDOMEN: Soft, nontender, nondistended. Bowel sounds present. No organomegaly or mass.   EXTREMITIES: No cyanosis, clubbing or edema b/l.    NEUROLOGIC: Cranial nerves II through XII are intact. No focal Motor or sensory deficits b/l.   PSYCHIATRIC: The patient is alert and oriented x 3.  SKIN: No obvious rash, lesion, or ulcer.    LABORATORY PANEL:   CBC Recent Labs  Lab 04/11/17 0445  WBC 5.3  HGB 8.2*  HCT 24.9*  PLT 168   ------------------------------------------------------------------------------------------------------------------  Chemistries  Recent Labs  Lab 04/11/17 0657  NA 136  K 3.9  CL 110  CO2 23  GLUCOSE 86  BUN 30*  CREATININE 1.64*  CALCIUM 7.3*   ------------------------------------------------------------------------------------------------------------------  Cardiac Enzymes Recent Labs  Lab 04/09/17 1135  TROPONINI <0.03   ------------------------------------------------------------------------------------------------------------------  RADIOLOGY:  No results found.   ASSESSMENT AND PLAN:   84 year old female with past medical history of chronic atrial fibrillation, CHF, hypertension who presented to the hospital due to shortness of breath and noted to be significantly anemic.  1.  Acute GI bleed-this is the cause of patient's anemia.  This is likely a upper GI bleed given patient's melanotic stools. - Continue to hold Xarelto, continue Protonix.  Placed on clear liquid diet.  As per GI plan for upper GI endoscopy and colonoscopy tomorrow. - follow serial Hg.    2. Acute blood Loss Anemia - due to GI bleed.  - s/p Transfusion and hg. Stable and will monitor.  - hold Xarelto for now.   3.  Acute kidney injury-secondary to ATN from volume loss and GI bleed.  Creatinine improved with blood transfusion and fluids.  Follow BUN and creatinine and urine output.  Renal dose meds, avoid nephrotoxins.  4.  Essential hypertension-continue  Toprol.  5.  History of diastolic CHF.  Clinically patient is not in congestive heart  failure. -Continue Lasix, Toprol.    All the records are reviewed and case discussed with Care Management/Social Worker. Management plans discussed with the patient, family and they are in agreement.  CODE STATUS: DNR  DVT Prophylaxis: TEd's & SCD's.   TOTAL TIME TAKING CARE OF THIS PATIENT: 30 minutes.   POSSIBLE D/C IN 1-2 DAYS, DEPENDING ON CLINICAL CONDITION.   Henreitta Leber M.D on 04/11/2017 at 2:33 PM  Between 7am to 6pm - Pager - [phone]  After 6pm go to [email or web address] - Proofreader  [facility]  Office  [phone]  CC: Primary care physician; Lynnell Jude, MD

## 2017-04-11 NOTE — Progress Notes (Signed)
[facility]  ROUNDING NOTE   Subjective:   Sister at bedside.   Creatinine 1.64 (2.03)  States she feels better.   Objective:  Vital signs in last 24 hours:  Temp:  [97.9 F (36.6 C)-98.5 F (36.9 C)] 98.1 F (36.7 C) (04/06 0450) Pulse Rate:  [47-103] 55 (04/06 0948) Resp:  [18-20] 18 (04/06 0450) BP: (122-145)/(48-63) 145/48 (04/06 0948) SpO2:  [91 %-100 %] 100 % (04/06 0450)  Weight change:  Filed Weights   04/09/17 1125 04/10/17 0500  Weight: 74.8 kg (165 lb) 72 kg (158 lb 11.7 oz)    Intake/Output: I/O last 3 completed shifts: In: 3019.1 [P.O.:700; I.V.:1433.1; Blood:886] Out: 1100 [Urine:1100]   Intake/Output this shift:  Total I/O In: 700 [P.O.:700] Out: -   Physical Exam: General: NAD,   Head: Normocephalic, atraumatic. Moist oral mucosal membranes  Eyes: Anicteric, PERRL  Neck: Supple, trachea midline  Lungs:  Clear to auscultation  Heart: Regular rate and rhythm  Abdomen:  Soft, nontender,   Extremities: no peripheral edema.  Neurologic: Nonfocal, moving all four extremities  Skin: No lesions        Basic Metabolic Panel: Recent Labs  Lab 04/09/17 1135 04/10/17 0902 04/11/17 0657  NA 140 142 136  K 4.4 4.0 3.9  CL 108 112* 110  CO2 22 25 23   GLUCOSE 127* 83 86  BUN 51* 42* 30*  CREATININE 2.38* 2.03* 1.64*  CALCIUM 8.3* 8.0* 7.3*    Liver Function Tests: No results for input(s): AST, ALT, ALKPHOS, BILITOT, PROT, ALBUMIN in the last 168 hours. No results for input(s): LIPASE, AMYLASE in the last 168 hours. No results for input(s): AMMONIA in the last 168 hours.  CBC: Recent Labs  Lab 04/09/17 1135 04/09/17 2332 04/10/17 0902 04/10/17 1627 04/11/17 0445  WBC 7.3  --  4.7 5.9 5.3  HGB 4.0* 5.8* 8.5* 10.2* 8.2*  HCT 13.3* 18.9* 26.5* 31.6* 24.9*  MCV 75.3*  --  81.3 83.3 80.4  PLT 308  --  189 177 168    Cardiac Enzymes: Recent Labs  Lab 04/09/17 1135  TROPONINI <0.03    BNP: Invalid input(s):  POCBNP  CBG: No results for input(s): GLUCAP in the last 168 hours.  Microbiology: Results for orders placed or performed during the hospital encounter of 12/31/16  Blood culture (routine x 2)     Status: Abnormal   Collection Time: 12/31/16  9:15 AM  Result Value Ref Range Status   Specimen Description   Final    BLOOD LEFT ANTECUBITAL Performed at [facility], [address], [place], [place] [address]    Special Requests   Final    BOTTLES DRAWN AEROBIC AND ANAEROBIC Blood Culture adequate volume Performed at [facility], [address] [place], [place] [address]    Culture  Setup Time   Final    GRAM NEGATIVE RODS IN BOTH AEROBIC AND ANAEROBIC BOTTLES CRITICAL RESULT CALLED TO, READ BACK BY AND VERIFIED WITH: SHEEMA HALLAJI ON 01/01/17 AT 78 QSD     Culture (A)  Final    HAEMOPHILUS INFLUENZAE NON TYPABLE BIOTYPE II BETA LACTAMASE NEGATIVE HEALTH DEPARTMENT NOTIFIED RESULTS BY [facility] [place], [place] Performed at [facility], [place] [address], [place],  [address]    Report Status 01/27/2017 FINAL  Final  Blood Culture ID Panel (Reflexed)     Status: Abnormal   Collection Time: 12/31/16  9:15 AM  Result Value Ref Range Status  Enterococcus species NOT DETECTED NOT DETECTED Final   Listeria monocytogenes NOT DETECTED NOT DETECTED Final   Staphylococcus species NOT DETECTED NOT DETECTED Final   Staphylococcus aureus NOT DETECTED NOT DETECTED Final   Streptococcus species NOT DETECTED NOT DETECTED Final   Streptococcus agalactiae NOT DETECTED NOT DETECTED Final   Streptococcus pneumoniae NOT DETECTED NOT DETECTED Final   Streptococcus pyogenes NOT DETECTED NOT DETECTED Final   Acinetobacter baumannii NOT DETECTED NOT DETECTED Final   Enterobacteriaceae species NOT DETECTED NOT DETECTED Final   Enterobacter cloacae complex NOT DETECTED NOT DETECTED Final   Escherichia coli NOT DETECTED NOT DETECTED Final   Klebsiella  oxytoca NOT DETECTED NOT DETECTED Final   Klebsiella pneumoniae NOT DETECTED NOT DETECTED Final   Proteus species NOT DETECTED NOT DETECTED Final   Serratia marcescens NOT DETECTED NOT DETECTED Final   Haemophilus influenzae DETECTED (A) NOT DETECTED Final    Comment: CRITICAL RESULT CALLED TO, READ BACK BY AND VERIFIED WITH: SHEEMA HALLAJI ON 01/01/17 AT 1153 QSD    Neisseria meningitidis NOT DETECTED NOT DETECTED Final   Pseudomonas aeruginosa NOT DETECTED NOT DETECTED Final   Candida albicans NOT DETECTED NOT DETECTED Final   Candida glabrata NOT DETECTED NOT DETECTED Final   Candida krusei NOT DETECTED NOT DETECTED Final   Candida parapsilosis NOT DETECTED NOT DETECTED Final   Candida tropicalis NOT DETECTED NOT DETECTED Final    Comment: Performed at [facility], [address] [place], [place] [address]  Blood culture (routine x 2)     Status: None   Collection Time: 12/31/16 10:00 AM  Result Value Ref Range Status   Specimen Description BLOOD RIGHT ANTECUBITAL  Final   Special Requests   Final    BOTTLES DRAWN AEROBIC AND ANAEROBIC Blood Culture results may not be optimal due to an excessive volume of blood received in culture bottles   Culture   Final    NO GROWTH 5 DAYS Performed at [facility], [address] [place], [place] [address]    Report Status 01/05/2017 FINAL  Final  MRSA PCR Screening     Status: None   Collection Time: 01/01/17  8:59 PM  Result Value Ref Range Status   MRSA by PCR NEGATIVE NEGATIVE Final    Comment:        The GeneXpert MRSA Assay (FDA approved for NASAL specimens only), is one component of a comprehensive MRSA colonization surveillance program. It is not intended to diagnose MRSA infection nor to guide or monitor treatment for MRSA infections. Performed at [facility], [address] [place], [place] [address]   CULTURE, BLOOD (ROUTINE X 2) w Reflex to ID Panel     Status: None   Collection Time:  01/02/17  5:30 AM  Result Value Ref Range Status   Specimen Description BLOOD RIGHT HAND  Final   Special Requests   Final    BOTTLES DRAWN AEROBIC AND ANAEROBIC Blood Culture adequate volume   Culture   Final    NO GROWTH 5 DAYS Performed at [facility], [address] [place],  [address]    Report Status 01/07/2017 FINAL  Final  CULTURE, BLOOD (ROUTINE X 2) w Reflex to ID Panel     Status: None   Collection Time: 01/02/17  5:30 AM  Result Value Ref Range Status   Specimen Description BLOOD LEFT HAND  Final   Special Requests   Final    BOTTLES DRAWN AEROBIC AND ANAEROBIC Blood Culture results  may not be optimal due to an inadequate volume of blood received in culture bottles   Culture   Final    NO GROWTH 5 DAYS Performed at [facility], [address] [place], [place] [address]    Report Status 01/07/2017 FINAL  Final    Coagulation Studies: Recent Labs    04/09/17 1248 04/10/17 0902 04/10/17 1627  LABPROT 39.5* 19.0* 16.4*  INR 4.11* 1.61 1.33    Urinalysis: No results for input(s): COLORURINE, LABSPEC, PHURINE, GLUCOSEU, HGBUR, BILIRUBINUR, KETONESUR, PROTEINUR, UROBILINOGEN, NITRITE, LEUKOCYTESUR in the last 72 hours.  Invalid input(s): APPERANCEUR    Imaging: Dg Chest 2 View  Result Date: 04/09/2017 CLINICAL DATA:  Worsening shortness of breath over the last week. History of congestive heart failure. EXAM: CHEST - 2 VIEW COMPARISON:  02/20/2017.  01/03/2017. FINDINGS: Cardiomegaly with left ventricular prominence. Tortuous aorta. Chronic widespread pulmonary scarring. No sign of active infiltrate, mass, effusion or collapse. Both mild edema and mild pneumonia could be hidden within the chronic markings. No acute bone finding. IMPRESSION: Cardiomegaly and chronic pulmonary scarring. No active process identified, though low level edema or pneumonia could be hidden within the chronic markings. Electronically Signed   By: Nelson Chimes M.D.    On: 04/09/2017 12:27     Medications:   . pantoprozole (PROTONIX) infusion 8 mg/hr (04/09/17 1852)  . pantoprozole (PROTONIX) infusion 8 mg/hr (04/11/17 0134)   . docusate sodium  100 mg Oral BID  . metoprolol succinate  100 mg Oral Daily  . metoprolol succinate  50 mg Oral Once  . [START ON 04/13/2017] pantoprazole  40 mg Intravenous Q12H  . [START ON 04/13/2017] pantoprazole  40 mg Intravenous Q12H   acetaminophen **OR** acetaminophen, albuterol, furosemide, HYDROcodone-acetaminophen, ondansetron **OR** ondansetron (ZOFRAN) IV, polyethylene glycol  Assessment/ Plan:  [name] is a 84 y.o.  female [name] is a 84 y.o. black female with hypertension, congestive heart failure, atrial fibrillation on xarelto, who was admitted to [facility] on 04/09/2017 for Melena [K92.1] SOB (shortness of breath) [R06.02]  1. Acute renal failure 2. Chronic kidney disease stage IV: baseline creatinine of 1.9, GFR of 23 on 02/23/17 3. Anemia from GI bleed: status post 4 units PRBC 4. Hypercoaguable state 5. Congestive heart failure not in exacerbation  Impression Acute renal failure secondary to acute anemia causing ATN Chronic kidney disease seems to be secondary to cardiorenal syndrome Bland Urine Discontinue IV fluids. Hemodynamically stable.    LOS: 2 Jonae Renshaw 4/6/201911:50 AM

## 2017-04-11 NOTE — Progress Notes (Signed)
[facility] Gastroenterology Inpatient Progress Note  Subjective: Patient seen for follow-up of melena and anemia.  No recurrent bleeding overnight.  Patient will be preparing for EGD and colonoscopy tomorrow.  No acute problems today.  Objective: Vital signs in last 24 hours: Temp:  [98.1 F (36.7 C)-98.5 F (36.9 C)] 98.1 F (36.7 C) (04/06 0450) Pulse Rate:  [55-103] 55 (04/06 1447) Resp:  [18-20] 18 (04/06 0450) BP: (122-146)/(48-63) 146/56 (04/06 1447) SpO2:  [92 %-100 %] 92 % (04/06 1447) Blood pressure (!) 146/56, pulse (!) 55, temperature 98.1 F (36.7 C), temperature source Oral, resp. rate 18, height 5\' 4"  (1.626 m), weight 72 kg (158 lb 11.7 oz), SpO2 92 %.    Intake/Output from previous day: 04/05 0701 - 04/06 0700 In: 2138.1 [P.O.:700; I.V.:1121.1; Blood:317] Out: 450 [Urine:450]  Intake/Output this shift: Total I/O In: 1400 [P.O.:1400] Out: -    General appearance:  Alert, HOH  Resp:  Clear bilaterally. Cardio:  RRR GI:  Soft and minimally tender diffusely Extremities:  No edema.   Lab Results: Results for orders placed or performed during the hospital encounter of 04/09/17 (from the past 24 hour(s))  Protime-INR     Status: Abnormal   Collection Time: 04/10/17  4:27 PM  Result Value Ref Range   Prothrombin Time 16.4 (H) 11.4 - 15.2 seconds   INR 1.33   CBC     Status: Abnormal   Collection Time: 04/10/17  4:27 PM  Result Value Ref Range   WBC 5.9 3.6 - 11.0 K/uL   RBC 3.80 3.80 - 5.20 MIL/uL   Hemoglobin 10.2 (L) 12.0 - 16.0 g/dL   HCT 31.6 (L) 35.0 - 47.0 %   MCV 83.3 80.0 - 100.0 fL   MCH 26.8 26.0 - 34.0 pg   MCHC 32.2 32.0 - 36.0 g/dL   RDW 16.9 (H) 11.5 - 14.5 %   Platelets 177 150 - 440 K/uL  CBC     Status: Abnormal   Collection Time: 04/11/17  4:45 AM  Result Value Ref Range   WBC 5.3 3.6 - 11.0 K/uL   RBC 3.10 (L) 3.80 - 5.20 MIL/uL   Hemoglobin 8.2 (L) 12.0 - 16.0 g/dL   HCT 24.9 (L) 35.0 - 47.0 %   MCV 80.4 80.0 - 100.0 fL   MCH 26.5 26.0 - 34.0 pg   MCHC 33.0 32.0 - 36.0 g/dL   RDW 17.1 (H) 11.5 - 14.5 %   Platelets 168 150 - 440 K/uL  Basic metabolic panel     Status: Abnormal   Collection Time: 04/11/17  6:57 AM  Result Value Ref Range   Sodium 136 135 - 145 mmol/L   Potassium 3.9 3.5 - 5.1 mmol/L   Chloride 110 101 - 111 mmol/L   CO2 23 22 - 32 mmol/L   Glucose, Bld 86 65 - 99 mg/dL   BUN 30 (H) 6 - 20 mg/dL   Creatinine, Ser 1.64 (H) 0.44 - 1.00 mg/dL   Calcium 7.3 (L) 8.9 - 10.3 mg/dL   GFR calc non Af Amer 27 (L) >60 mL/min   GFR calc Af Amer 32 (L) >60 mL/min   Anion gap 3 (L) 5 - 15     Recent Labs    04/10/17 0902 04/10/17 1627 04/11/17 0445  WBC 4.7 5.9 5.3  HGB 8.5* 10.2* 8.2*  HCT 26.5* 31.6* 24.9*  PLT 189 177 168   BMET Recent Labs    04/09/17 1135 04/10/17 0902 04/11/17 0657  NA  140 142 136  K 4.4 4.0 3.9  CL 108 112* 110  CO2 22 25 23   GLUCOSE 127* 83 86  BUN 51* 42* 30*  CREATININE 2.38* 2.03* 1.64*  CALCIUM 8.3* 8.0* 7.3*   LFT No results for input(s): PROT, ALBUMIN, AST, ALT, ALKPHOS, BILITOT, BILIDIR, IBILI in the last 72 hours. PT/INR Recent Labs    04/10/17 0902 04/10/17 1627  LABPROT 19.0* 16.4*  INR 1.61 1.33   Hepatitis Panel No results for input(s): HEPBSAG, HCVAB, HEPAIGM, HEPBIGM in the last 72 hours. C-Diff No results for input(s): CDIFFTOX in the last 72 hours. No results for input(s): CDIFFPCR in the last 72 hours.   Studies/Results: No results found.  Scheduled Inpatient Medications:   . bisacodyl  20 mg Oral Once  . docusate sodium  100 mg Oral BID  . metoprolol succinate  100 mg Oral Daily  . metoprolol succinate  50 mg Oral Once  . [START ON 04/13/2017] pantoprazole  40 mg Intravenous Q12H  . [START ON 04/13/2017] pantoprazole  40 mg Intravenous Q12H  . polyethylene glycol-electrolytes  4,000 mL Oral Once    Continuous Inpatient Infusions:   . sodium chloride    . pantoprozole (PROTONIX) infusion 8 mg/hr (04/09/17 1852)  .  pantoprozole (PROTONIX) infusion 8 mg/hr (04/11/17 1309)    PRN Inpatient Medications:  acetaminophen **OR** acetaminophen, albuterol, furosemide, HYDROcodone-acetaminophen, ondansetron **OR** ondansetron (ZOFRAN) IV, polyethylene glycol  Miscellaneous: N/A  Assessment:  1. Anemia secondary to Gastrointestinal blood loss.  2. Melena, stable currently.  3. Advanced age.  4. Atrial fibrillation.  5. Long term anticoagulation with Xarelto, held.   Plan:   1. Proceed with EGD and colonoscopy tomorrow.   Heiley Shaikh K. Alice Reichert, M.D. 04/11/2017, 4:26 PM

## 2017-04-11 NOTE — H&P (View-Only) (Signed)
[facility] Gastroenterology Inpatient Progress Note  Subjective: Patient seen for follow-up of melena and anemia.  No recurrent bleeding overnight.  Patient will be preparing for EGD and colonoscopy tomorrow.  No acute problems today.  Objective: Vital signs in last 24 hours: Temp:  [98.1 F (36.7 C)-98.5 F (36.9 C)] 98.1 F (36.7 C) (04/06 0450) Pulse Rate:  [55-103] 55 (04/06 1447) Resp:  [18-20] 18 (04/06 0450) BP: (122-146)/(48-63) 146/56 (04/06 1447) SpO2:  [92 %-100 %] 92 % (04/06 1447) Blood pressure (!) 146/56, pulse (!) 55, temperature 98.1 F (36.7 C), temperature source Oral, resp. rate 18, height 5\' 4"  (1.626 m), weight 72 kg (158 lb 11.7 oz), SpO2 92 %.    Intake/Output from previous day: 04/05 0701 - 04/06 0700 In: 2138.1 [P.O.:700; I.V.:1121.1; Blood:317] Out: 450 [Urine:450]  Intake/Output this shift: Total I/O In: 1400 [P.O.:1400] Out: -    General appearance:  Alert, HOH  Resp:  Clear bilaterally. Cardio:  RRR GI:  Soft and minimally tender diffusely Extremities:  No edema.   Lab Results: Results for orders placed or performed during the hospital encounter of 04/09/17 (from the past 24 hour(s))  Protime-INR     Status: Abnormal   Collection Time: 04/10/17  4:27 PM  Result Value Ref Range   Prothrombin Time 16.4 (H) 11.4 - 15.2 seconds   INR 1.33   CBC     Status: Abnormal   Collection Time: 04/10/17  4:27 PM  Result Value Ref Range   WBC 5.9 3.6 - 11.0 K/uL   RBC 3.80 3.80 - 5.20 MIL/uL   Hemoglobin 10.2 (L) 12.0 - 16.0 g/dL   HCT 31.6 (L) 35.0 - 47.0 %   MCV 83.3 80.0 - 100.0 fL   MCH 26.8 26.0 - 34.0 pg   MCHC 32.2 32.0 - 36.0 g/dL   RDW 16.9 (H) 11.5 - 14.5 %   Platelets 177 150 - 440 K/uL  CBC     Status: Abnormal   Collection Time: 04/11/17  4:45 AM  Result Value Ref Range   WBC 5.3 3.6 - 11.0 K/uL   RBC 3.10 (L) 3.80 - 5.20 MIL/uL   Hemoglobin 8.2 (L) 12.0 - 16.0 g/dL   HCT 24.9 (L) 35.0 - 47.0 %   MCV 80.4 80.0 - 100.0 fL   MCH 26.5 26.0 - 34.0 pg   MCHC 33.0 32.0 - 36.0 g/dL   RDW 17.1 (H) 11.5 - 14.5 %   Platelets 168 150 - 440 K/uL  Basic metabolic panel     Status: Abnormal   Collection Time: 04/11/17  6:57 AM  Result Value Ref Range   Sodium 136 135 - 145 mmol/L   Potassium 3.9 3.5 - 5.1 mmol/L   Chloride 110 101 - 111 mmol/L   CO2 23 22 - 32 mmol/L   Glucose, Bld 86 65 - 99 mg/dL   BUN 30 (H) 6 - 20 mg/dL   Creatinine, Ser 1.64 (H) 0.44 - 1.00 mg/dL   Calcium 7.3 (L) 8.9 - 10.3 mg/dL   GFR calc non Af Amer 27 (L) >60 mL/min   GFR calc Af Amer 32 (L) >60 mL/min   Anion gap 3 (L) 5 - 15     Recent Labs    04/10/17 0902 04/10/17 1627 04/11/17 0445  WBC 4.7 5.9 5.3  HGB 8.5* 10.2* 8.2*  HCT 26.5* 31.6* 24.9*  PLT 189 177 168   BMET Recent Labs    04/09/17 1135 04/10/17 0902 04/11/17 0657  NA  140 142 136  K 4.4 4.0 3.9  CL 108 112* 110  CO2 22 25 23   GLUCOSE 127* 83 86  BUN 51* 42* 30*  CREATININE 2.38* 2.03* 1.64*  CALCIUM 8.3* 8.0* 7.3*   LFT No results for input(s): PROT, ALBUMIN, AST, ALT, ALKPHOS, BILITOT, BILIDIR, IBILI in the last 72 hours. PT/INR Recent Labs    04/10/17 0902 04/10/17 1627  LABPROT 19.0* 16.4*  INR 1.61 1.33   Hepatitis Panel No results for input(s): HEPBSAG, HCVAB, HEPAIGM, HEPBIGM in the last 72 hours. C-Diff No results for input(s): CDIFFTOX in the last 72 hours. No results for input(s): CDIFFPCR in the last 72 hours.   Studies/Results: No results found.  Scheduled Inpatient Medications:   . bisacodyl  20 mg Oral Once  . docusate sodium  100 mg Oral BID  . metoprolol succinate  100 mg Oral Daily  . metoprolol succinate  50 mg Oral Once  . [START ON 04/13/2017] pantoprazole  40 mg Intravenous Q12H  . [START ON 04/13/2017] pantoprazole  40 mg Intravenous Q12H  . polyethylene glycol-electrolytes  4,000 mL Oral Once    Continuous Inpatient Infusions:   . sodium chloride    . pantoprozole (PROTONIX) infusion 8 mg/hr (04/09/17 1852)  .  pantoprozole (PROTONIX) infusion 8 mg/hr (04/11/17 1309)    PRN Inpatient Medications:  acetaminophen **OR** acetaminophen, albuterol, furosemide, HYDROcodone-acetaminophen, ondansetron **OR** ondansetron (ZOFRAN) IV, polyethylene glycol  Miscellaneous: N/A  Assessment:  1. Anemia secondary to Gastrointestinal blood loss.  2. Melena, stable currently.  3. Advanced age.  4. Atrial fibrillation.  5. Long term anticoagulation with Xarelto, held.   Plan:   1. Proceed with EGD and colonoscopy tomorrow.   Aalina Brege K. Alice Reichert, M.D. 04/11/2017, 4:26 PM

## 2017-04-11 NOTE — Progress Notes (Signed)
CCMD called and reported a 2.4 second pause. Dr. Verdell Carmine was present on the unit and was notified of this. He gave an order to hold dose of toprol xl.

## 2017-04-12 ENCOUNTER — Inpatient Hospital Stay: Payer: Medicare HMO | Admitting: Anesthesiology

## 2017-04-12 ENCOUNTER — Encounter
Admission: EM | Disposition: A | Discharge status: Home / Self Care | Payer: Self-pay | Source: Home / Self Care | Attending: Specialist

## 2017-04-12 ENCOUNTER — Encounter: Payer: Self-pay | Admitting: Anesthesiology

## 2017-04-12 HISTORY — PX: COLONOSCOPY: SHX5424

## 2017-04-12 HISTORY — PX: ESOPHAGOGASTRODUODENOSCOPY: SHX5428

## 2017-04-12 LAB — KOH PREP: KOH PREP: NONE SEEN

## 2017-04-12 LAB — RENAL FUNCTION PANEL
ALBUMIN: 1.9 g/dL — AB (ref 3.5–5.0)
Anion gap: 4 — ABNORMAL LOW (ref 5–15)
BUN: 19 mg/dL (ref 6–20)
CALCIUM: 7.2 mg/dL — AB (ref 8.9–10.3)
CO2: 21 mmol/L — AB (ref 22–32)
CREATININE: 1.5 mg/dL — AB (ref 0.44–1.00)
Chloride: 112 mmol/L — ABNORMAL HIGH (ref 101–111)
GFR calc Af Amer: 35 mL/min — ABNORMAL LOW (ref >60)
GFR calc non Af Amer: 31 mL/min — ABNORMAL LOW (ref >60)
GLUCOSE: 82 mg/dL (ref 65–99)
Phosphorus: 2.4 mg/dL — ABNORMAL LOW (ref 2.5–4.6)
Potassium: 4.4 mmol/L (ref 3.5–5.1)
SODIUM: 137 mmol/L (ref 135–145)

## 2017-04-12 LAB — HEMOGLOBIN: HEMOGLOBIN: 10 g/dL — AB (ref 12.0–16.0)

## 2017-04-12 SURGERY — COLONOSCOPY
Anesthesia: General

## 2017-04-12 MED ORDER — LIDOCAINE 2% (20 MG/ML) 5 ML SYRINGE
INTRAMUSCULAR | Status: DC | PRN
  Administered 2017-04-12: 100 mg via INTRAVENOUS

## 2017-04-12 MED ORDER — SODIUM CHLORIDE 0.9 % IV SOLN: INTRAVENOUS | Status: DC

## 2017-04-12 MED ORDER — PROPOFOL 500 MG/50ML IV EMUL
INTRAVENOUS | Status: AC
  Filled 2017-04-12: qty 50

## 2017-04-12 MED ORDER — PANTOPRAZOLE SODIUM 40 MG PO TBEC
40.0000 mg | DELAYED_RELEASE_TABLET | Freq: Two times a day (BID) | ORAL | Status: DC
  Administered 2017-04-12 – 2017-04-13 (×3): 40 mg via ORAL
  Filled 2017-04-12 (×3): qty 1

## 2017-04-12 MED ORDER — PHENYLEPHRINE HCL 10 MG/ML IJ SOLN
INTRAMUSCULAR | Status: DC | PRN
  Administered 2017-04-12 (×3): 100 ug via INTRAVENOUS

## 2017-04-12 MED ORDER — PHENYLEPHRINE HCL 10 MG/ML IJ SOLN
INTRAMUSCULAR | Status: AC
  Filled 2017-04-12: qty 1

## 2017-04-12 MED ORDER — LIDOCAINE HCL (PF) 2 % IJ SOLN
INTRAMUSCULAR | Status: AC
  Filled 2017-04-12: qty 10

## 2017-04-12 MED ORDER — PROPOFOL 10 MG/ML IV BOLUS
INTRAVENOUS | Status: DC | PRN
  Administered 2017-04-12: 30 mg via INTRAVENOUS
  Administered 2017-04-12: 50 mg via INTRAVENOUS
  Administered 2017-04-12: 20 mg via INTRAVENOUS

## 2017-04-12 MED ORDER — PROPOFOL 500 MG/50ML IV EMUL
INTRAVENOUS | Status: DC | PRN
  Administered 2017-04-12: 150 ug/kg/min via INTRAVENOUS

## 2017-04-12 NOTE — Interval H&P Note (Signed)
History and Physical Interval Note:  04/12/2017 9:57 AM  Mercedes Rodriguez  has presented today for surgery, with the diagnosis of Melena/Abdominal Pain  The various methods of treatment have been discussed with the patient and family. After consideration of risks, benefits and other options for treatment, the patient has consented to  Procedure(s): COLONOSCOPY (N/A) ESOPHAGOGASTRODUODENOSCOPY (EGD) (N/A) as a surgical intervention .  The patient's history has been reviewed, patient examined, no change in status, stable for surgery.  I have reviewed the patient's chart and labs.  Questions were answered to the patient's satisfaction.     [place], [place]

## 2017-04-12 NOTE — Anesthesia Post-op Follow-up Note (Signed)
Anesthesia QCDR form completed.        

## 2017-04-12 NOTE — Transfer of Care (Signed)
Immediate Anesthesia Transfer of Care Note  Patient: Mercedes Rodriguez  Procedure(s) Performed: COLONOSCOPY (N/A ) ESOPHAGOGASTRODUODENOSCOPY (EGD) (N/A )  Patient Location: PACU  Anesthesia Type:General  Level of Consciousness: sedated  Airway & Oxygen Therapy: Patient connected to face mask oxygen  Post-op Assessment: Post -op Vital signs reviewed and stable  Post vital signs: stable  Last Vitals:  Vitals Value Taken Time  BP 102/60 04/12/2017  8:59 AM  Temp    Pulse 84 04/12/2017  8:59 AM  Resp 11 04/12/2017  8:59 AM  SpO2 98 % 04/12/2017  8:59 AM    Last Pain:  Vitals:   04/12/17 0724  TempSrc:   PainSc: 5          Complications: No apparent anesthesia complications

## 2017-04-12 NOTE — Anesthesia Postprocedure Evaluation (Signed)
Anesthesia Post Note  Patient: Mercedes Rodriguez  Procedure(s) Performed: COLONOSCOPY (N/A ) ESOPHAGOGASTRODUODENOSCOPY (EGD) (N/A )  Patient location during evaluation: PACU Anesthesia Type: General Level of consciousness: oriented and awake and alert Pain management: pain level controlled Vital Signs Assessment: post-procedure vital signs reviewed and stable Respiratory status: spontaneous breathing, respiratory function stable and patient connected to nasal cannula oxygen Cardiovascular status: blood pressure returned to baseline and stable Postop Assessment: no headache, no backache and no apparent nausea or vomiting Anesthetic complications: no     Last Vitals:  Vitals:   04/12/17 0914 04/12/17 0928  BP: 109/71 119/69  Pulse:    Resp: 18 13  Temp:    SpO2: 100%     Last Pain:  Vitals:   04/12/17 0859  TempSrc:   PainSc: Asleep                 [name]

## 2017-04-12 NOTE — Progress Notes (Signed)
[facility] [place] NAME: Mercedes Rodriguez    MR#:  [id number]  DATE OF BIRTH:  1929/09/24  SUBJECTIVE:   S/p Endoscopy and Colonoscopy showing no evidence of bleeding.  Patient denies any belly pain, nausea or vomiting.  REVIEW OF SYSTEMS:    Review of Systems  Constitutional: Negative for chills and fever.  HENT: Negative for congestion and tinnitus.   Eyes: Negative for blurred vision and double vision.  Respiratory: Negative for cough, shortness of breath and wheezing.   Cardiovascular: Negative for chest pain, orthopnea and PND.  Gastrointestinal: Negative for abdominal pain, diarrhea, nausea and vomiting.  Genitourinary: Negative for dysuria and hematuria.  Neurological: Negative for dizziness, sensory change and focal weakness.  All other systems reviewed and are negative.   Nutrition: Clear liquid and will advance as tolerated Tolerating Diet: Yes Tolerating PT: Await Eval.   DRUG ALLERGIES:  No Known Allergies  VITALS:  Blood pressure 116/69, pulse 69, temperature 98.5 F (36.9 C), resp. rate 13, height 5\' 4"  (1.626 m), weight 75 kg (165 lb 4.8 oz), SpO2 93 %.  PHYSICAL EXAMINATION:   Physical Exam  GENERAL:  84 y.o.-year-old patient lying in bed in no acute distress.  EYES: Pupils equal, round, reactive to light and accommodation. No scleral icterus. Extraocular muscles intact.  HEENT: Head atraumatic, normocephalic. Oropharynx and nasopharynx clear.  NECK:  Supple, no jugular venous distention. No thyroid enlargement, no tenderness.  LUNGS: Normal breath sounds bilaterally, no wheezing, rales, rhonchi. No use of accessory muscles of respiration.  CARDIOVASCULAR: S1, S2 normal. No murmurs, rubs, or gallops.  ABDOMEN: Soft, nontender, nondistended. Bowel sounds present. No organomegaly or mass.  EXTREMITIES: No cyanosis, clubbing or edema b/l.    NEUROLOGIC: Cranial nerves II through XII are intact. No focal Motor or  sensory deficits b/l.   PSYCHIATRIC: The patient is alert and oriented x 3.  SKIN: No obvious rash, lesion, or ulcer.    LABORATORY PANEL:   CBC Recent Labs  Lab 04/11/17 0445  WBC 5.3  HGB 8.2*  HCT 24.9*  PLT 168   ------------------------------------------------------------------------------------------------------------------  Chemistries  Recent Labs  Lab 04/12/17 0448  NA 137  K 4.4  CL 112*  CO2 21*  GLUCOSE 82  BUN 19  CREATININE 1.50*  CALCIUM 7.2*   ------------------------------------------------------------------------------------------------------------------  Cardiac Enzymes Recent Labs  Lab 04/09/17 1135  TROPONINI <0.03   ------------------------------------------------------------------------------------------------------------------  RADIOLOGY:  No results found.   ASSESSMENT AND PLAN:   84 year old female with past medical history of chronic atrial fibrillation, CHF, hypertension who presented to the hospital due to shortness of breath and noted to be significantly anemic.  1.  Acute GI bleed-this is the cause of patient's anemia.  -Patient is status post endoscopy colonoscopy showing no evidence of acute bleeding.  Consider capsule/push enteroscopy if patient has further bleeding as per gastroenterology. -Repeat hemoglobin this morning is still pending.  If stable will resume Xarelto.  Started on clear liquid diet and will advance as tolerated. - follow serial Hg.    2. Acute blood Loss Anemia - due to GI bleed.  - s/p Transfusion and hg. Stable as of yesterday and await repeat results today.  - hold Xarelto for now and will resume if Hg. Remains stable.   3.  Acute kidney injury-secondary to ATN from volume loss and GI bleed.  Creatinine improved with blood transfusion and fluids.   - Follow BUN/Cr.   4.  Essential hypertension-continue Toprol.  5.  History of diastolic CHF.  Clinically patient is not in congestive heart  failure. -Continue Lasix, Toprol.  Discharge home tomorrow if no further bleeding and remained stable overnight.  All the records are reviewed and case discussed with Care Management/Social Worker. Management plans discussed with the patient, family and they are in agreement.  CODE STATUS: DNR  DVT Prophylaxis: TEd's & SCD's.   TOTAL TIME TAKING CARE OF THIS PATIENT: 30 minutes.   POSSIBLE D/C IN 1-2 DAYS, DEPENDING ON CLINICAL CONDITION.   Henreitta Leber M.D on 04/12/2017 at 2:43 PM  Between 7am to 6pm - Pager - [phone]  After 6pm go to [email or web address] - Proofreader  [employer] [facility]  Hospitalists  Office  [phone]  CC: Primary care physician; Lynnell Jude, MD

## 2017-04-12 NOTE — Anesthesia Preprocedure Evaluation (Addendum)
Anesthesia Evaluation  Patient identified by MRN, date of birth, ID band Patient awake    Reviewed: Allergy & Precautions, H&P , NPO status , Patient's Chart, lab work & pertinent test results  History of Anesthesia Complications Negative for: history of anesthetic complications  Airway Mallampati: III  TM Distance: <3 FB Neck ROM: limited    Dental  (+) Missing, Edentulous Upper, Edentulous Lower   Pulmonary shortness of breath, COPD, Current Smoker, former smoker,           Cardiovascular Exercise Tolerance: Poor hypertension, (-) angina+CHF       Neuro/Psych negative neurological ROS  negative psych ROS   GI/Hepatic negative GI ROS, Neg liver ROS, neg GERD  ,  Endo/Other  negative endocrine ROS  Renal/GU ESRFRenal disease  negative genitourinary   Musculoskeletal   Abdominal   Peds  Hematology negative hematology ROS (+)   Anesthesia Other Findings Past Medical History: No date: Arrhythmia No date: Atrial fibrillation (HCC) No date: CHF (congestive heart failure) (HCC) No date: Hearing loss No date: Hypertension  History reviewed. No pertinent surgical history.  BMI    Body Mass Index:  28.37 kg/m      Reproductive/Obstetrics negative OB ROS                            Anesthesia Physical Anesthesia Plan  ASA: IV  Anesthesia Plan: General   Post-op Pain Management:    Induction: Intravenous  PONV Risk Score and Plan: Propofol infusion and TIVA  Airway Management Planned: Natural Airway and Nasal Cannula  Additional Equipment:   Intra-op Plan:   Post-operative Plan:   Informed Consent: I have reviewed the patients History and Physical, chart, labs and discussed the procedure including the risks, benefits and alternatives for the proposed anesthesia with the patient or authorized representative who has indicated his/her understanding and acceptance.   Dental  Advisory Given  Plan Discussed with: Anesthesiologist, CRNA and Surgeon  Anesthesia Plan Comments: (Plan to suspend DNR for procedure.  Patient voiced understanding.   Patient consented for risks of anesthesia including but not limited to:  - adverse reactions to medications - risk of intubation if required - damage to teeth, lips or other oral mucosa - sore throat or hoarseness - Damage to heart, brain, lungs or loss of life  Patient voiced understanding.)        Anesthesia Quick Evaluation

## 2017-04-12 NOTE — Plan of Care (Signed)
Pt with successful EGD and colonoscopy this am. No nausea and has tolerated advance in diet. No pain since returning from procedure. Up in room. D/C protonix gtt.

## 2017-04-12 NOTE — Op Note (Signed)
[facility] Gastroenterology Patient Name: Mercedes Rodriguez Procedure Date: 04/12/2017 7:42 AM MRN: [id number] Account #: [id number] Date of Birth: 09-06-1929 Admit Type: Inpatient Age: 84 Room: [facility] ENDO ROOM 4 Gender: Female Note Status: Finalized Procedure:            Upper GI endoscopy Indications:          Recent gastrointestinal bleeding, Suspected upper                        gastrointestinal bleeding Providers:            Benay Pike. Alice Reichert MD, MD Referring MD:         Lynnell Jude (Referring MD) Medicines:            Propofol per Anesthesia Complications:        No immediate complications. Procedure:            Pre-Anesthesia Assessment:                       - The risks and benefits of the procedure and the                        sedation options and risks were discussed with the                        patient. All questions were answered and informed                        consent was obtained.                       - Patient identification and proposed procedure were                        verified prior to the procedure by the nurse. The                        procedure was verified in the procedure room.                       - ASA Grade Assessment: III - A patient with severe                        systemic disease.                       - After reviewing the risks and benefits, the patient                        was deemed in satisfactory condition to undergo the                        procedure.                       After obtaining informed consent, the endoscope was                        passed under direct vision. Throughout the procedure,  the patient's blood pressure, pulse, and oxygen                        saturations were monitored continuously. The Endoscope                        was introduced through the mouth, and advanced to the                        third part of duodenum. The upper GI endoscopy was               accomplished without difficulty. The patient tolerated                        the procedure well. Findings:      Diffuse, white plaques were found in the middle third of the esophagus       and in the lower third of the esophagus. Cells for cytology were       obtained by brushing.      A small hiatal hernia was present.      A single 15 mm mucosal papule (nodule) with no bleeding and no stigmata       of recent bleeding was found in the gastric antrum. Biopsies were taken       with a cold forceps for histology.      The exam was otherwise without abnormality.      The examined duodenum was normal. Impression:           - Esophageal plaques were found, suspicious for                        candidiasis. Cells for cytology obtained.                       - Small hiatal hernia.                       - A single mucosal papule (nodule) found in the                        stomach. Biopsied.                       - The examination was otherwise normal.                       - Normal examined duodenum. Recommendation:       - Await pathology results.                       - Proceed with colonoscopy Procedure Code(s):    --- Professional ---                       [phone], Esophagogastroduodenoscopy, flexible, transoral;                        with biopsy, single or multiple Diagnosis Code(s):    --- Professional ---                       K22.9, Disease of esophagus, unspecified  K44.9, Diaphragmatic hernia without obstruction or                        gangrene                       K31.89, Other diseases of stomach and duodenum                       K92.2, Gastrointestinal hemorrhage, unspecified CPT copyright 2017 [employer]. All rights reserved. The codes documented in this report are preliminary and upon coder review may  be revised to meet current compliance requirements. Efrain Sella MD, MD 04/12/2017 8:37:24 AM This report has been  signed electronically. Number of Addenda: 0 Note Initiated On: 04/12/2017 7:42 AM      [facility]

## 2017-04-12 NOTE — Progress Notes (Signed)
Report given to RN Josh

## 2017-04-12 NOTE — Op Note (Signed)
[facility] Gastroenterology Patient Name: Mercedes Rodriguez Procedure Date: 04/12/2017 7:41 AM MRN: [id number] Account #: [id number] Date of Birth: 27-Aug-1929 Admit Type: Inpatient Age: 84 Room: [facility] ENDO ROOM 4 Gender: Female Note Status: Finalized Procedure:            Colonoscopy Indications:          Evaluation of unexplained GI bleeding Providers:            Benay Pike. Alice Reichert MD, MD Referring MD:         Lynnell Jude (Referring MD) Medicines:            Propofol per Anesthesia Complications:        No immediate complications. Procedure:            Pre-Anesthesia Assessment:                       - The risks and benefits of the procedure and the                        sedation options and risks were discussed with the                        patient. All questions were answered and informed                        consent was obtained.                       - Patient identification and proposed procedure were                        verified prior to the procedure by the nurse. The                        procedure was verified in the procedure room.                       - ASA Grade Assessment: III - A patient with severe                        systemic disease.                       - After reviewing the risks and benefits, the patient                        was deemed in satisfactory condition to undergo the                        procedure.                       After obtaining informed consent, the colonoscope was                        passed under direct vision. Throughout the procedure,                        the patient's blood pressure, pulse, and oxygen  saturations were monitored continuously. The                        Colonoscope was introduced through the anus and                        advanced to the the cecum, identified by appendiceal                        orifice and ileocecal valve. The colonoscopy was       performed without difficulty. The patient tolerated the                        procedure well. The quality of the bowel preparation                        was excellent. The ileocecal valve, appendiceal                        orifice, and rectum were photographed. Findings:      The perianal and digital rectal examinations were normal. Pertinent       negatives include normal sphincter tone and no palpable rectal lesions.      The colon (entire examined portion) appeared normal.      The exam was otherwise without abnormality on direct and retroflexion       views. Impression:           - The entire examined colon is normal.                       - The examination was otherwise normal on direct and                        retroflexion views.                       - No specimens collected. Recommendation:       - Return patient to hospital ward for ongoing care.                       - Will consider small bowel wireless capsule endoscopy                        based upon review of pathology results.                       - Advance diet as tolerated - advance as tolerated to                        diabetic (ADA) diet. Procedure Code(s):    --- Professional ---                       [phone], Colonoscopy, flexible; diagnostic, including                        collection of specimen(s) by brushing or washing, when                        performed (separate procedure) Diagnosis Code(s):    --- Professional ---  K92.2, Gastrointestinal hemorrhage, unspecified CPT copyright 2017 American Medical Association. All rights reserved. The codes documented in this report are preliminary and upon coder review may  be revised to meet current compliance requirements. Efrain Sella MD, MD 04/12/2017 8:54:25 AM This report has been signed electronically. Number of Addenda: 0 Note Initiated On: 04/12/2017 7:41 AM Scope Withdrawal Time: 0 hours 5 minutes 48 seconds  Total Procedure  Duration: 0 hours 9 minutes 19 seconds       [facility]

## 2017-04-12 NOTE — Progress Notes (Signed)
[facility]  ROUNDING NOTE   Subjective:   Family at bedside.   EGD for today.   Creatinine 1.5 (1.64)  Objective:  Vital signs in last 24 hours:  Temp:  [97.8 F (36.6 C)-98.2 F (36.8 C)] 97.8 F (36.6 C) (04/07 0859) Pulse Rate:  [52-102] 84 (04/07 0859) Resp:  [11-19] (P) 13 (04/07 0928) BP: (102-146)/(56-79) (P) 119/69 (04/07 0928) SpO2:  [92 %-100 %] 100 % (04/07 0914) Weight:  [75 kg (165 lb 4.8 oz)] 75 kg (165 lb 4.8 oz) (04/07 0500)  Weight change:  Filed Weights   04/09/17 1125 04/10/17 0500 04/12/17 0500  Weight: 74.8 kg (165 lb) 72 kg (158 lb 11.7 oz) 75 kg (165 lb 4.8 oz)    Intake/Output: I/O last 3 completed shifts: In: 3610.9 [P.O.:2160; I.V.:1450.9] Out: 253 [Urine:251; Stool:2]   Intake/Output this shift:  Total I/O In: 11.7 [I.V.:11.7] Out: -   Physical Exam: General: NAD,   Head: Normocephalic, atraumatic. Moist oral mucosal membranes  Eyes: Anicteric, PERRL  Neck: Supple, trachea midline  Lungs:  Clear to auscultation  Heart: Regular rate and rhythm  Abdomen:  Soft, nontender,   Extremities: no peripheral edema.  Neurologic: Nonfocal, moving all four extremities  Skin: No lesions        Basic Metabolic Panel: Recent Labs  Lab 04/09/17 1135 04/10/17 0902 04/11/17 0657 04/12/17 0448  NA 140 142 136 137  K 4.4 4.0 3.9 4.4  CL 108 112* 110 112*  CO2 22 25 23  21*  GLUCOSE 127* 83 86 82  BUN 51* 42* 30* 19  CREATININE 2.38* 2.03* 1.64* 1.50*  CALCIUM 8.3* 8.0* 7.3* 7.2*  PHOS  --   --   --  2.4*    Liver Function Tests: Recent Labs  Lab 04/12/17 0448  ALBUMIN 1.9*   No results for input(s): LIPASE, AMYLASE in the last 168 hours. No results for input(s): AMMONIA in the last 168 hours.  CBC: Recent Labs  Lab 04/09/17 1135 04/09/17 2332 04/10/17 0902 04/10/17 1627 04/11/17 0445  WBC 7.3  --  4.7 5.9 5.3  HGB 4.0* 5.8* 8.5* 10.2* 8.2*  HCT 13.3* 18.9* 26.5* 31.6* 24.9*  MCV 75.3*  --  81.3 83.3 80.4   PLT 308  --  189 177 168    Cardiac Enzymes: Recent Labs  Lab 04/09/17 1135  TROPONINI <0.03    BNP: Invalid input(s): POCBNP  CBG: No results for input(s): GLUCAP in the last 168 hours.  Microbiology: Results for orders placed or performed during the hospital encounter of 04/09/17  KOH prep     Status: None   Collection Time: 04/12/17 11:46 AM  Result Value Ref Range Status   Specimen Description ESOPHAGUS  Final   Special Requests NONE  Final   KOH Prep   Final    NO YEAST OR FUNGAL ELEMENTS SEEN Performed at [facility], [address] [place], [place] [address]    Report Status 04/12/2017 FINAL  Final    Coagulation Studies: Recent Labs    04/09/17 1248 04/10/17 0902 04/10/17 1627  LABPROT 39.5* 19.0* 16.4*  INR 4.11* 1.61 1.33    Urinalysis: No results for input(s): COLORURINE, LABSPEC, PHURINE, GLUCOSEU, HGBUR, BILIRUBINUR, KETONESUR, PROTEINUR, UROBILINOGEN, NITRITE, LEUKOCYTESUR in the last 72 hours.  Invalid input(s): APPERANCEUR    Imaging: No results found.   Medications:   . sodium chloride 10 mL/hr at 04/11/17 1843   . docusate sodium  100 mg Oral BID  . metoprolol succinate  100 mg Oral Daily  . metoprolol succinate  50 mg Oral Once  . pantoprazole  40 mg Oral BID   acetaminophen **OR** acetaminophen, albuterol, furosemide, HYDROcodone-acetaminophen, ondansetron **OR** ondansetron (ZOFRAN) IV, polyethylene glycol  Assessment/ Plan:  [name] is a 84 y.o.  female [name] is a 84 y.o. black female with hypertension, congestive heart failure, atrial fibrillation on xarelto, who was admitted to [facility] on 04/09/2017 for Melena [K92.1] SOB (shortness of breath) [R06.02]  1. Acute renal failure 2. Chronic kidney disease stage IV: baseline creatinine of 1.9, GFR of 23 on 02/23/17 3. Anemia from GI bleed: status post 4 units PRBC on admission 4. Hypercoaguable state 5. Congestive heart failure not in  exacerbation  Impression Acute renal failure secondary to acute anemia causing ATN Chronic kidney disease seems to be secondary to cardiorenal syndrome Bland Urine Discontinue IV fluids. Hemodynamically stable.  Encourage PO intake.    LOS: 3 Mercedes Rodriguez 4/7/201912:36 PM

## 2017-04-13 ENCOUNTER — Encounter: Payer: Self-pay | Admitting: Internal Medicine

## 2017-04-13 LAB — TYPE AND SCREEN
ABO/RH(D): O POS
Antibody Screen: NEGATIVE
UNIT DIVISION: 0
UNIT DIVISION: 0
UNIT DIVISION: 0
UNIT DIVISION: 0
Unit division: 0
Unit division: 0

## 2017-04-13 LAB — BPAM RBC
BLOOD PRODUCT EXPIRATION DATE: 201904292359
Blood Product Expiration Date: 201904112359
Blood Product Expiration Date: 201904302359
Blood Product Expiration Date: 201904302359
Blood Product Expiration Date: 201904302359
Blood Product Expiration Date: 201905012359
ISSUE DATE / TIME: 201904041433
ISSUE DATE / TIME: 201904050410
ISSUE DATE / TIME: 201904041811
ISSUE DATE / TIME: 201904050028
UNIT TYPE AND RH: 5100
UNIT TYPE AND RH: 5100
UNIT TYPE AND RH: 5100
Unit Type and Rh: 5100
Unit Type and Rh: 5100
Unit Type and Rh: 9500

## 2017-04-13 LAB — CBC
HCT: 27.9 % — ABNORMAL LOW (ref 35.0–47.0)
Hemoglobin: 8.9 g/dL — ABNORMAL LOW (ref 12.0–16.0)
MCH: 25.8 pg — ABNORMAL LOW (ref 26.0–34.0)
MCHC: 31.8 g/dL — AB (ref 32.0–36.0)
MCV: 81.1 fL (ref 80.0–100.0)
Platelets: 168 10*3/uL (ref 150–440)
RBC: 3.44 MIL/uL — ABNORMAL LOW (ref 3.80–5.20)
RDW: 17.5 % — ABNORMAL HIGH (ref 11.5–14.5)
WBC: 5.7 10*3/uL (ref 3.6–11.0)

## 2017-04-13 MED ORDER — RIVAROXABAN 15 MG PO TABS
15.0000 mg | ORAL_TABLET | Freq: Every day | ORAL | Status: DC
  Filled 2017-04-13: qty 1

## 2017-04-13 NOTE — Discharge Summary (Signed)
[place] [place] NAME: Koren Plyler    MR#:  [id number]  DATE OF BIRTH:  09/12/1929  DATE OF ADMISSION:  04/09/2017 ADMITTING PHYSICIAN: Gorden Harms, MD  DATE OF DISCHARGE: 04/13/2017  PRIMARY CARE PHYSICIAN: Lynnell Jude, MD    ADMISSION DIAGNOSIS:  Melena [K92.1] SOB (shortness of breath) [R06.02]  DISCHARGE DIAGNOSIS:  Active Problems:   GIB (gastrointestinal bleeding)   SECONDARY DIAGNOSIS:   Past Medical History:  Diagnosis Date  . Arrhythmia   . Atrial fibrillation ([place])   . CHF (congestive heart failure) ([place])   . Hearing loss   . Hypertension     HOSPITAL COURSE:   84 year old female with past medical history of chronic atrial fibrillation, CHF, hypertension who presented to the hospital due to shortness of breath and noted to be significantly anemic.  1.  Acute GI bleed-this was the cause of patient's anemia.  -Patient presented to the hospital with a hemoglobin of 4.0.  Patient was transfused and hemoglobin has improved and stabilized and on the day of discharge is 8.6.  Patient is clinically stable has no evidence of bleeding.  Her Xarelto was stopped but has been resumed.  Patient is status post endoscopy/colonoscopy showing no evidence of acute bleeding.  If patient has further bleeding GI would consider capsule/push enteroscopy.  2. Acute blood Loss Anemia - due to GI bleed.  Patient presented to the hospital with a hemoglobin of 4.0, she has been transfused hemoglobin is improved to 8.6 on discharge.  Her Xarelto has been resumed and she has no further bleeding presently.  3.  Acute kidney injury-secondary to ATN from volume loss and GI bleed.  Creatinine improved and normalized with blood transfusion and fluids.    4.  Essential hypertension- pt. Will continue Toprol.  5.  History of diastolic CHF.  Clinically patient is not in congestive heart failure. - pt. Will Continue Lasix,  Toprol.    DISCHARGE CONDITIONS:   Stable.   CONSULTS OBTAINED:  Treatment Team:  Lavonia Dana, MD Efrain Sella, MD  DRUG ALLERGIES:  No Known Allergies  DISCHARGE MEDICATIONS:   Allergies as of 04/13/2017   No Known Allergies     Medication List    STOP taking these medications   albuterol (2.5 MG/3ML) 0.083% nebulizer solution Commonly known as:  PROVENTIL     TAKE these medications   acetaminophen 325 MG tablet Commonly known as:  TYLENOL Take 2 tablets (650 mg total) by mouth every 6 (six) hours as needed for mild pain (or Fever >/= 101).   carbamide peroxide 6.5 % OTIC solution Commonly known as:  DEBROX Place 4 drops into both ears 2 (two) times daily.   furosemide 40 MG tablet Commonly known as:  LASIX Take 1 tablet (40 mg total) by mouth daily. What changed:  how much to take   haloperidol 1 MG tablet Commonly known as:  HALDOL Take 1 mg by mouth every 6 (six) hours as needed for agitation.   hyoscyamine 0.125 MG tablet Commonly known as:  LEVSIN, ANASPAZ Take 0.125 mg by mouth. Every 4 to 6 hours as needed [secretions]   LORazepam 0.5 MG tablet Commonly known as:  ATIVAN Take 0.5 mg by mouth every 4 (four) hours as needed for anxiety.   metoprolol succinate 100 MG 24 hr tablet Commonly known as:  TOPROL-XL Take 100 mg by mouth daily. Take with or immediately following a meal.   morphine CONCENTRATE 10  mg / 0.5 ml concentrated solution Take 5 mg by mouth every hour as needed for severe pain or shortness of breath.   potassium chloride SA 20 MEQ tablet Commonly known as:  K-DUR,KLOR-CON Take 1 tablet (20 mEq total) by mouth daily.   ranitidine 150 MG tablet Commonly known as:  ZANTAC Take 150 mg by mouth 2 (two) times daily as needed for heartburn.   Rivaroxaban 15 MG Tabs tablet Commonly known as:  XARELTO Take 1 tablet (15 mg total) by mouth daily with supper.   traZODone 100 MG tablet Commonly known as:  DESYREL Take 1 tablet  (100 mg total) by mouth at bedtime as needed for sleep. What changed:  when to take this         DISCHARGE INSTRUCTIONS:   DIET:  Cardiac diet  DISCHARGE CONDITION:  Stable  ACTIVITY:  Activity as tolerated  OXYGEN:  Home Oxygen: No.   Oxygen Delivery: room air  DISCHARGE LOCATION:  home   If you experience worsening of your admission symptoms, develop shortness of breath, life threatening emergency, suicidal or homicidal thoughts you must seek medical attention immediately by calling 911 or calling your MD immediately  if symptoms less severe.  You Must read complete instructions/literature along with all the possible adverse reactions/side effects for all the Medicines you take and that have been prescribed to you. Take any new Medicines after you have completely understood and accpet all the possible adverse reactions/side effects.   Please note  You were cared for by a hospitalist during your hospital stay. If you have any questions about your discharge medications or the care you received while you were in the hospital after you are discharged, you can call the unit and asked to speak with the hospitalist on call if the hospitalist that took care of you is not available. Once you are discharged, your primary care physician will handle any further medical issues. Please note that NO REFILLS for any discharge medications will be authorized once you are discharged, as it is imperative that you return to your primary care physician (or establish a relationship with a primary care physician if you do not have one) for your aftercare needs so that they can reassess your need for medications and monitor your lab values.     Today   No acute bleeding overnight.  Hemoglobin stable today.  Patient Xarelto has been resumed.  Will discharge home today.  VITAL SIGNS:  Blood pressure 98/64, pulse 92, temperature 98.2 F (36.8 C), temperature source Oral, resp. rate 18, height 5\' 4"   (1.626 m), weight 75 kg (165 lb 4.8 oz), SpO2 100 %.  I/O:    Intake/Output Summary (Last 24 hours) at 04/13/2017 1431 Last data filed at 04/12/2017 1700 Gross per 24 hour  Intake 455.17 ml  Output -  Net 455.17 ml    PHYSICAL EXAMINATION:   GENERAL:  84 y.o.-year-old patient lying in bed in no acute distress.  EYES: Pupils equal, round, reactive to light and accommodation. No scleral icterus. Extraocular muscles intact.  HEENT: Head atraumatic, normocephalic. Oropharynx and nasopharynx clear.  NECK:  Supple, no jugular venous distention. No thyroid enlargement, no tenderness.  LUNGS: Normal breath sounds bilaterally, no wheezing, rales, rhonchi. No use of accessory muscles of respiration.  CARDIOVASCULAR: S1, S2 normal. No murmurs, rubs, or gallops.  ABDOMEN: Soft, nontender, nondistended. Bowel sounds present. No organomegaly or mass.  EXTREMITIES: No cyanosis, clubbing or edema b/l.    NEUROLOGIC: Cranial nerves II through  XII are intact. No focal Motor or sensory deficits b/l.   PSYCHIATRIC: The patient is alert and oriented x 3.  SKIN: No obvious rash, lesion, or ulcer.   DATA REVIEW:   CBC Recent Labs  Lab 04/13/17 0831  WBC 5.7  HGB 8.9*  HCT 27.9*  PLT 168    Chemistries  Recent Labs  Lab 04/12/17 0448  NA 137  K 4.4  CL 112*  CO2 21*  GLUCOSE 82  BUN 19  CREATININE 1.50*  CALCIUM 7.2*    Cardiac Enzymes Recent Labs  Lab 04/09/17 1135  TROPONINI <0.03    Microbiology Results  Results for orders placed or performed during the hospital encounter of 04/09/17  KOH prep     Status: None   Collection Time: 04/12/17 11:46 AM  Result Value Ref Range Status   Specimen Description ESOPHAGUS  Final   Special Requests NONE  Final   KOH Prep   Final    NO YEAST OR FUNGAL ELEMENTS SEEN Performed at [facility], [address], [place], [place] [address]    Report Status 04/12/2017 FINAL  Final    RADIOLOGY:  No results  found.    Management plans discussed with the patient, family and they are in agreement.  CODE STATUS:     Code Status Orders  (From admission, onward)        Start     Ordered   04/09/17 1744  Do not attempt resuscitation (DNR)  Continuous    Question Answer Comment  In the event of cardiac or respiratory ARREST Do not call a "code blue"   In the event of cardiac or respiratory ARREST Do not perform Intubation, CPR, defibrillation or ACLS   In the event of cardiac or respiratory ARREST Use medication by any route, position, wound care, and other measures to relive pain and suffering. May use oxygen, suction and manual treatment of airway obstruction as needed for comfort.   Comments RN may pronounce      04/09/17 1744      TOTAL TIME TAKING CARE OF THIS PATIENT: 40 minutes.    Henreitta Leber M.D on 04/13/2017 at 2:31 PM  Between 7am to 6pm - Pager - [phone]  After 6pm go to [email or web address] - Proofreader  [employer] [facility]  Hospitalists  Office  [phone]  CC: Primary care physician; Lynnell Jude, MD

## 2017-04-13 NOTE — Progress Notes (Signed)
[facility]  ROUNDING NOTE   Subjective:   Patient had upper endoscopy which showed whitish plaques suspicious for candida Also had colonoscopy which was negative Denies any acute c/o this morning Ate breakfast ; no Nausea or Vomiting   Objective:  Vital signs in last 24 hours:  Temp:  [97.6 F (36.4 C)-98.5 F (36.9 C)] 98.2 F (36.8 C) (04/08 0459) Pulse Rate:  [30-92] 92 (04/08 0459) Resp:  [18-20] 18 (04/08 0459) BP: (98-138)/(64-117) 98/64 (04/08 0459) SpO2:  [79 %-100 %] 100 % (04/08 0459)  Weight change:  Filed Weights   04/09/17 1125 04/10/17 0500 04/12/17 0500  Weight: 165 lb (74.8 kg) 158 lb 11.7 oz (72 kg) 165 lb 4.8 oz (75 kg)    Intake/Output: I/O last 3 completed shifts: In: 1156.6 [P.O.:720; I.V.:436.6] Out: 3 [Urine:1; Stool:2]   Intake/Output this shift:  No intake/output data recorded.  Physical Exam: General: NAD,   HENT Normocephalic, atraumatic. Moist oral mucosal membranes, decreased hearing  Eyes: Anicteric,   Neck: Supple, trachea midline  Lungs:  Clear to auscultation  Heart: Regular rate and rhythm  Abdomen:  Soft, nontender,   Extremities: no peripheral edema.  Neurologic: Nonfocal, moving all four extremities  Skin: No lesions        Basic Metabolic Panel: Recent Labs  Lab 04/09/17 1135 04/10/17 0902 04/11/17 0657 04/12/17 0448  NA 140 142 136 137  K 4.4 4.0 3.9 4.4  CL 108 112* 110 112*  CO2 22 25 23  21*  GLUCOSE 127* 83 86 82  BUN 51* 42* 30* 19  CREATININE 2.38* 2.03* 1.64* 1.50*  CALCIUM 8.3* 8.0* 7.3* 7.2*  PHOS  --   --   --  2.4*    Liver Function Tests: Recent Labs  Lab 04/12/17 0448  ALBUMIN 1.9*   No results for input(s): LIPASE, AMYLASE in the last 168 hours. No results for input(s): AMMONIA in the last 168 hours.  CBC: Recent Labs  Lab 04/09/17 1135 04/09/17 2332 04/10/17 0902 04/10/17 1627 04/11/17 0445 04/12/17 1645 04/13/17 0831  WBC 7.3  --  4.7 5.9 5.3  --  5.7  HGB 4.0*  5.8* 8.5* 10.2* 8.2* 10.0* 8.9*  HCT 13.3* 18.9* 26.5* 31.6* 24.9*  --  27.9*  MCV 75.3*  --  81.3 83.3 80.4  --  81.1  PLT 308  --  189 177 168  --  168    Cardiac Enzymes: Recent Labs  Lab 04/09/17 1135  TROPONINI <0.03    BNP: Invalid input(s): POCBNP  CBG: No results for input(s): GLUCAP in the last 168 hours.  Microbiology: Results for orders placed or performed during the hospital encounter of 04/09/17  KOH prep     Status: None   Collection Time: 04/12/17 11:46 AM  Result Value Ref Range Status   Specimen Description ESOPHAGUS  Final   Special Requests NONE  Final   KOH Prep   Final    NO YEAST OR FUNGAL ELEMENTS SEEN Performed at [facility], [address] [place], [place] [address]    Report Status 04/12/2017 FINAL  Final    Coagulation Studies: Recent Labs    04/10/17 1627  LABPROT 16.4*  INR 1.33    Urinalysis: No results for input(s): COLORURINE, LABSPEC, PHURINE, GLUCOSEU, HGBUR, BILIRUBINUR, KETONESUR, PROTEINUR, UROBILINOGEN, NITRITE, LEUKOCYTESUR in the last 72 hours.  Invalid input(s): APPERANCEUR    Imaging: No results found.   Medications:   . sodium chloride 10 mL/hr at 04/11/17 1843   .  docusate sodium  100 mg Oral BID  . metoprolol succinate  100 mg Oral Daily  . metoprolol succinate  50 mg Oral Once  . pantoprazole  40 mg Oral BID  . Rivaroxaban  15 mg Oral Q supper   acetaminophen **OR** acetaminophen, albuterol, furosemide, HYDROcodone-acetaminophen, ondansetron **OR** ondansetron (ZOFRAN) IV, polyethylene glycol  Assessment/ Plan:  Mercedes Rodriguez is a 84 y.o.  African [place] female with hypertension, congestive heart failure, atrial fibrillation on xarelto, who was admitted to [facility] on 04/09/2017 for Melena [K92.1] SOB (shortness of breath) [R06.02]  1. Acute renal failure 2. Chronic kidney disease stage 3; new baseline  3. Anemia from GI bleed: status post 4 units PRBC on admission 4. Hypercoaguable  state  Impression Acute renal failure secondary to ATN, improving Chronic kidney disease seems to be secondary to atherosclerosis Bland Urine S Creatinine continue to improve. May establish a new baseline      LOS: [place] 4/8/201910:10 AM

## 2017-04-13 NOTE — Care Management Important Message (Signed)
Important Message  Patient Details  Name: Mercedes Rodriguez MRN: [id number] Date of Birth: 1929/09/12   Medicare Important Message Given:  Yes    Beverly Sessions, RN 04/13/2017, 11:47 AM

## 2017-04-13 NOTE — Care Management (Signed)
Patient admitted with GI bleed.  Patient lives at home with so.  PCP [facility]. Patient has Rw, WC, and cane in the home.  Per son patient was followed by [facility]osp[facility] and [facility], up until a few weeks ago.  Patient has chronic o2 in the home that was provided by Hospice.  Son states they are in the process of getting it switched over from Hospice.   Son to bring portable tank for discharge.  RN staff to ambulated prior to discharge.  Per son patient is independent in the home, rarely uses her walker, and performs her on ADL's.

## 2017-04-16 LAB — SURGICAL PATHOLOGY

## 2017-04-17 ENCOUNTER — Telehealth: Payer: Self-pay

## 2017-04-17 NOTE — Telephone Encounter (Signed)
Flagged on EMMI report for not reading discharge papers, not having a follow up, and being unsure if there were new prescriptions.  Called and spoke with patient, who mentioned she has her discharge paper work and has read some of it, but not all.  Reminded her that she has a follow up appointment with Dr. Clemmie Krill on 04/21/17 at 2:15pm as a follow up from her hospital visit.  After reviewing her AVS, it does not look like she was started on any new medications.  She does not have any further questions or concerns at this time.  I thanked her for her time and informed her that she would receive one more automated call in the next few days checking on her.

## 2017-06-29 ENCOUNTER — Telehealth: Payer: Self-pay | Admitting: Family

## 2017-06-29 ENCOUNTER — Ambulatory Visit: Admitting: Family

## 2017-06-29 NOTE — Telephone Encounter (Signed)
Patient did not show for her [facility] appointment on 06/29/17. Will attempt to reschedule.

## 2017-07-22 ENCOUNTER — Inpatient Hospital Stay
Admission: EM | Admit: 2017-07-22 | Discharge: 2017-07-25 | DRG: 543 | Disposition: A | Discharge status: Home / Self Care | Payer: Medicare HMO | Attending: Internal Medicine | Admitting: Internal Medicine

## 2017-07-22 ENCOUNTER — Emergency Department: Payer: Medicare HMO

## 2017-07-22 ENCOUNTER — Encounter: Payer: Self-pay | Admitting: *Deleted

## 2017-07-22 ENCOUNTER — Other Ambulatory Visit: Payer: Self-pay

## 2017-07-22 DIAGNOSIS — F419 Anxiety disorder, unspecified: Secondary | ICD-10-CM | POA: Diagnosis present

## 2017-07-22 DIAGNOSIS — R2240 Localized swelling, mass and lump, unspecified lower limb: Secondary | ICD-10-CM

## 2017-07-22 DIAGNOSIS — D631 Anemia in chronic kidney disease: Secondary | ICD-10-CM | POA: Diagnosis present

## 2017-07-22 DIAGNOSIS — I130 Hypertensive heart and chronic kidney disease with heart failure and stage 1 through stage 4 chronic kidney disease, or unspecified chronic kidney disease: Secondary | ICD-10-CM | POA: Diagnosis present

## 2017-07-22 DIAGNOSIS — C499 Malignant neoplasm of connective and soft tissue, unspecified: Principal | ICD-10-CM | POA: Diagnosis present

## 2017-07-22 DIAGNOSIS — Z23 Encounter for immunization: Secondary | ICD-10-CM

## 2017-07-22 DIAGNOSIS — Z87891 Personal history of nicotine dependence: Secondary | ICD-10-CM

## 2017-07-22 DIAGNOSIS — M79604 Pain in right leg: Secondary | ICD-10-CM

## 2017-07-22 DIAGNOSIS — R6510 Systemic inflammatory response syndrome (SIRS) of non-infectious origin without acute organ dysfunction: Secondary | ICD-10-CM | POA: Diagnosis present

## 2017-07-22 DIAGNOSIS — Z7901 Long term (current) use of anticoagulants: Secondary | ICD-10-CM

## 2017-07-22 DIAGNOSIS — Z9981 Dependence on supplemental oxygen: Secondary | ICD-10-CM

## 2017-07-22 DIAGNOSIS — H9190 Unspecified hearing loss, unspecified ear: Secondary | ICD-10-CM | POA: Diagnosis present

## 2017-07-22 DIAGNOSIS — Z66 Do not resuscitate: Secondary | ICD-10-CM | POA: Diagnosis present

## 2017-07-22 DIAGNOSIS — I482 Chronic atrial fibrillation: Secondary | ICD-10-CM | POA: Diagnosis present

## 2017-07-22 DIAGNOSIS — R609 Edema, unspecified: Secondary | ICD-10-CM

## 2017-07-22 DIAGNOSIS — C169 Malignant neoplasm of stomach, unspecified: Secondary | ICD-10-CM | POA: Diagnosis present

## 2017-07-22 DIAGNOSIS — I509 Heart failure, unspecified: Secondary | ICD-10-CM | POA: Diagnosis present

## 2017-07-22 DIAGNOSIS — L03115 Cellulitis of right lower limb: Secondary | ICD-10-CM | POA: Diagnosis present

## 2017-07-22 DIAGNOSIS — N184 Chronic kidney disease, stage 4 (severe): Secondary | ICD-10-CM | POA: Diagnosis present

## 2017-07-22 LAB — CBC WITH DIFFERENTIAL/PLATELET
BASOS ABS: 0.1 10*3/uL (ref 0–0.1)
Basophils Relative: 1 %
EOS ABS: 0.6 10*3/uL (ref 0–0.7)
Eosinophils Relative: 6 %
HEMATOCRIT: 28.3 % — AB (ref 35.0–47.0)
Hemoglobin: 9.2 g/dL — ABNORMAL LOW (ref 12.0–16.0)
Lymphocytes Relative: 15 %
Lymphs Abs: 1.4 10*3/uL (ref 1.0–3.6)
MCH: 27.2 pg (ref 26.0–34.0)
MCHC: 32.5 g/dL (ref 32.0–36.0)
MCV: 83.7 fL (ref 80.0–100.0)
MONOS PCT: 10 %
Monocytes Absolute: 1 10*3/uL — ABNORMAL HIGH (ref 0.2–0.9)
NEUTROS ABS: 6.4 10*3/uL (ref 1.4–6.5)
NEUTROS PCT: 68 %
Platelets: 209 10*3/uL (ref 150–440)
RBC: 3.39 MIL/uL — AB (ref 3.80–5.20)
RDW: 17.5 % — ABNORMAL HIGH (ref 11.5–14.5)
WBC: 9.5 10*3/uL (ref 3.6–11.0)

## 2017-07-22 LAB — BASIC METABOLIC PANEL
ANION GAP: 7 (ref 5–15)
BUN: 25 mg/dL — AB (ref 8–23)
CHLORIDE: 106 mmol/L (ref 98–111)
CO2: 27 mmol/L (ref 22–32)
Calcium: 8.6 mg/dL — ABNORMAL LOW (ref 8.9–10.3)
Creatinine, Ser: 1.56 mg/dL — ABNORMAL HIGH (ref 0.44–1.00)
GFR calc Af Amer: 34 mL/min — ABNORMAL LOW (ref >60)
GFR calc non Af Amer: 29 mL/min — ABNORMAL LOW (ref >60)
GLUCOSE: 98 mg/dL (ref 70–99)
POTASSIUM: 4.3 mmol/L (ref 3.5–5.1)
Sodium: 140 mmol/L (ref 135–145)

## 2017-07-22 MED ORDER — IOHEXOL 300 MG/ML  SOLN
75.0000 mL | Freq: Once | INTRAMUSCULAR | Status: AC | PRN
  Administered 2017-07-22: 75 mL via INTRAVENOUS

## 2017-07-22 MED ORDER — SODIUM CHLORIDE 0.9 % IV BOLUS
250.0000 mL | Freq: Once | INTRAVENOUS | Status: AC
  Administered 2017-07-22: 250 mL via INTRAVENOUS

## 2017-07-22 MED ORDER — PIPERACILLIN-TAZOBACTAM 3.375 G IVPB 30 MIN
3.3750 g | Freq: Once | INTRAVENOUS | Status: AC
  Administered 2017-07-23: 3.375 g via INTRAVENOUS
  Filled 2017-07-22: qty 50

## 2017-07-22 MED ORDER — VANCOMYCIN HCL IN DEXTROSE 1-5 GM/200ML-% IV SOLN
1000.0000 mg | Freq: Once | INTRAVENOUS | Status: AC
Start: 2017-07-23 — End: 2017-07-23
  Administered 2017-07-23: 1000 mg via INTRAVENOUS
  Filled 2017-07-22: qty 200

## 2017-07-22 NOTE — ED Provider Notes (Signed)
[facility] Emergency Department Provider Note ____________________________________________   First MD Initiated Contact with Patient 07/22/17 2021     (approximate)  I have reviewed the triage vital signs and the nursing notes.   HISTORY  Chief Complaint Leg Pain  HPI Mercedes Rodriguez is a 84 y.o. female history of atrial fibrillation, CHF on Xarelto as well as recently diagnosed gastric cancer who was presented to the emergency department today with 2 weeks of worsening right lower extremity swelling.  The patient is here with her granddaughter who states that she has been on 2 rounds of amoxicillin for suspected infection for this right lower extremity.  However, despite antibiotics the swelling and redness have persisted.  Past Medical History:  Diagnosis Date  . Arrhythmia   . Atrial fibrillation ([place])   . CHF (congestive heart failure) ([place])   . Hearing loss   . Hypertension     Patient Active Problem List   Diagnosis Date Noted  . GIB (gastrointestinal bleeding) 04/09/2017  . Bradycardia 02/21/2017  . Sinus bradycardia 02/20/2017  . HTN (hypertension) 01/29/2017  . Hard of hearing 01/21/2017  . Atrial fibrillation with RVR ([place])   . Palliative care encounter   . CHF (congestive heart failure) ([place]) 12/31/2016    Past Surgical History:  Procedure Laterality Date  . COLONOSCOPY N/A 04/12/2017   Procedure: COLONOSCOPY;  Surgeon: Toledo, Benay Pike, MD;  Location: [facility] ENDOSCOPY;  Service: Gastroenterology;  Laterality: N/A;  . ESOPHAGOGASTRODUODENOSCOPY N/A 04/12/2017   Procedure: ESOPHAGOGASTRODUODENOSCOPY (EGD);  Surgeon: Toledo, Benay Pike, MD;  Location: [facility] ENDOSCOPY;  Service: Gastroenterology;  Laterality: N/A;    Prior to Admission medications   Medication Sig Start Date End Date Taking? Authorizing Provider  acetaminophen (TYLENOL) 325 MG tablet Take 2 tablets (650 mg total) by mouth every 6 (six) hours as needed for mild pain (or Fever  >/= 101). 01/07/17   Gouru, Illene Silver, MD  carbamide peroxide (DEBROX) 6.5 % OTIC solution Place 4 drops into both ears 2 (two) times daily.    [provider]  furosemide (LASIX) 40 MG tablet Take 1 tablet (40 mg total) by mouth daily. Patient taking differently: Take 20 mg by mouth daily.  02/23/17   Loletha Grayer, MD  haloperidol (HALDOL) 1 MG tablet Take 1 mg by mouth every 6 (six) hours as needed for agitation.    [provider]  hyoscyamine (LEVSIN, ANASPAZ) 0.125 MG tablet Take 0.125 mg by mouth. Every 4 to 6 hours as needed [secretions]    [provider]  LORazepam (ATIVAN) 0.5 MG tablet Take 0.5 mg by mouth every 4 (four) hours as needed for anxiety.    [provider]  metoprolol succinate (TOPROL-XL) 100 MG 24 hr tablet Take 100 mg by mouth daily. Take with or immediately following a meal.    [provider]  Morphine Sulfate (MORPHINE CONCENTRATE) 10 mg / 0.5 ml concentrated solution Take 5 mg by mouth every hour as needed for severe pain or shortness of breath.    [provider]  potassium chloride SA (K-DUR,KLOR-CON) 20 MEQ tablet Take 1 tablet (20 mEq total) by mouth daily. 02/23/17   Loletha Grayer, MD  ranitidine (ZANTAC) 150 MG tablet Take 150 mg by mouth 2 (two) times daily as needed for heartburn.    [provider]  Rivaroxaban (XARELTO) 15 MG TABS tablet Take 1 tablet (15 mg total) by mouth daily with supper. 02/23/17   Loletha Grayer, MD  traZODone (DESYREL) 100 MG  tablet Take 1 tablet (100 mg total) by mouth at bedtime as needed for sleep. Patient taking differently: Take 100 mg by mouth at bedtime.  02/23/17   Loletha Grayer, MD    Allergies Patient has no known allergies.  No family history on file.  Social History Social History   Tobacco Use  . Smoking status: Former Smoker    Types: Cigarettes    Last attempt to quit: 01/06/1993    Years since quitting: 24.5  . Smokeless tobacco: Never Used    Substance Use Topics  . Alcohol use: No  . Drug use: Not on file    Review of Systems  Constitutional: No fever/chills Eyes: No visual changes. ENT: No sore throat. Cardiovascular: Denies chest pain. Respiratory: Denies shortness of breath. Gastrointestinal: No abdominal pain.  No nausea, no vomiting.  No diarrhea.  No constipation. Genitourinary: Negative for dysuria. Musculoskeletal: Negative for back pain. Skin: Negative for rash. Neurological: Negative for headaches, focal weakness or numbness.   ____________________________________________   PHYSICAL EXAM:  VITAL SIGNS: ED Triage Vitals  Enc Vitals Group     BP 07/22/17 1918 122/76     Pulse Rate 07/22/17 1918 (!) 104     Resp 07/22/17 1918 20     Temp 07/22/17 1918 98.6 F (37 C)     Temp Source 07/22/17 1918 Oral     SpO2 07/22/17 1918 98 %     Weight 07/22/17 1918 156 lb (70.8 kg)     Height 07/22/17 1918 5\' 2"  (1.575 m)     Head Circumference --      Peak Flow --      Pain Score 07/22/17 1917 5     Pain Loc --      Pain Edu? --      Excl. in [place]? --     Constitutional: Alert and oriented. Well appearing and in no acute distress. Eyes: Conjunctivae are normal.  Head: Atraumatic. Nose: No congestion/rhinnorhea. Mouth/Throat: Mucous membranes are moist.  Neck: No stridor.   Cardiovascular: Normal rate, regular rhythm. Grossly normal heart sounds.   Respiratory: Normal respiratory effort.  No retractions. Lungs CTAB. Gastrointestinal: Soft and nontender. No distention.  Musculoskeletal: Right lower extremity with moderate to severe edema from the ankle to the mid thigh.  To the medial right knee there is a indurated area of approximately 8 x 20 cm.  Nonfluctuant.  No tenderness to palpation.  It is mobile.  Left lower extremity without any swelling, erythema.  However, there are multiple varicose veins. Neurologic:  Normal speech and language. No gross focal neurologic deficits are appreciated. Skin:  Skin  is warm, dry and intact. No rash noted. Psychiatric: Mood and affect are normal. Speech and behavior are normal.  ____________________________________________   LABS (all labs ordered are listed, but only abnormal results are displayed)  Labs Reviewed  CBC WITH DIFFERENTIAL/PLATELET - Abnormal; Notable for the following components:      Result Value   RBC 3.39 (*)    Hemoglobin 9.2 (*)    HCT 28.3 (*)    RDW 17.5 (*)    Monocytes Absolute 1.0 (*)    All other components within normal limits  BASIC METABOLIC PANEL - Abnormal; Notable for the following components:   BUN 25 (*)    Creatinine, Ser 1.56 (*)    Calcium 8.6 (*)    GFR calc non Af Amer 29 (*)    GFR calc Af Amer 34 (*)    All other components  within normal limits   ____________________________________________  EKG   ____________________________________________  RADIOLOGY  9.2 cm right medial thigh lesion more concerning for neoplasm than abscess. ____________________________________________   PROCEDURES  Procedure(s) performed:   Procedures  Critical Care performed:   ____________________________________________   INITIAL IMPRESSION / ASSESSMENT AND PLAN / ED COURSE  Pertinent labs & imaging results that were available during my care of the patient were reviewed by me and considered in my medical decision making (see chart for details).  DDX: Cellulitis, abscess, tumor, superinfection of tumor As part of my medical decision making, I reviewed the following data within the [place] previous outpatient records   ----------------------------------------- 11:22 PM on 07/22/2017 -----------------------------------------  Patient was unable to get the MRI because of a recent possible gastric clip placed during an endoscopy this past Monday.  I discussed the case with Dr. Carman Ching who is at the next best imaging modality would be CT with contrast.  Patient pending CT with  contrast read at this time.  Signed out to Dr. Beather Arbour.  ____________________________________________   FINAL CLINICAL IMPRESSION(S) / ED DIAGNOSES  Right lower extremity edema.  Right lower extremity pain.  NEW MEDICATIONS STARTED DURING THIS VISIT:  New Prescriptions   No medications on file     Note:  This document was prepared using Dragon voice recognition software and may include unintentional dictation errors.     Orbie Pyo, MD 07/22/17 [phone]

## 2017-07-22 NOTE — ED Triage Notes (Signed)
Pt to triage via wheelchair.  Pt sent to er for eval of right leg pain from [facility].  Pt has swelling to right knee area.   Pt alert

## 2017-07-22 NOTE — ED Provider Notes (Signed)
-----------------------------------------   11:41 PM on 07/22/2017 -----------------------------------------  Assumed care of patient.  In short, this is a 84 year old female on Xarelto for atrial fibrillation who has finished 2 courses of antibiotics since June 25 for right lower leg cellulitis.  She was sent by case [facility] for worsening redness and swelling.  Initial venous Doppler of the right lower extremity suspicious for neoplasm.  CT femur, tib/fib concerning for abscess, hematoma, neoplasm with central necrosis.  Will check blood cultures, lactate and start 1 g IV vancomycin and 3.375 g IV Zosyn for antibiotics.  Will discuss with hospitalist to evaluate patient in the emergency department for recurrent infection with failed outpatient antibiotic therapy x2.   Paulette Blanch, MD 07/23/17 [phone]

## 2017-07-23 ENCOUNTER — Encounter: Payer: Self-pay | Admitting: Internal Medicine

## 2017-07-23 DIAGNOSIS — L03115 Cellulitis of right lower limb: Secondary | ICD-10-CM | POA: Diagnosis present

## 2017-07-23 DIAGNOSIS — Z7901 Long term (current) use of anticoagulants: Secondary | ICD-10-CM | POA: Diagnosis not present

## 2017-07-23 DIAGNOSIS — R609 Edema, unspecified: Secondary | ICD-10-CM | POA: Diagnosis present

## 2017-07-23 DIAGNOSIS — Z87891 Personal history of nicotine dependence: Secondary | ICD-10-CM

## 2017-07-23 DIAGNOSIS — Z9981 Dependence on supplemental oxygen: Secondary | ICD-10-CM | POA: Diagnosis not present

## 2017-07-23 DIAGNOSIS — M79604 Pain in right leg: Secondary | ICD-10-CM

## 2017-07-23 DIAGNOSIS — R2241 Localized swelling, mass and lump, right lower limb: Secondary | ICD-10-CM

## 2017-07-23 DIAGNOSIS — I482 Chronic atrial fibrillation: Secondary | ICD-10-CM | POA: Diagnosis present

## 2017-07-23 DIAGNOSIS — F419 Anxiety disorder, unspecified: Secondary | ICD-10-CM | POA: Diagnosis present

## 2017-07-23 DIAGNOSIS — H9190 Unspecified hearing loss, unspecified ear: Secondary | ICD-10-CM | POA: Diagnosis present

## 2017-07-23 DIAGNOSIS — C499 Malignant neoplasm of connective and soft tissue, unspecified: Secondary | ICD-10-CM | POA: Diagnosis present

## 2017-07-23 DIAGNOSIS — I130 Hypertensive heart and chronic kidney disease with heart failure and stage 1 through stage 4 chronic kidney disease, or unspecified chronic kidney disease: Secondary | ICD-10-CM | POA: Diagnosis present

## 2017-07-23 DIAGNOSIS — I509 Heart failure, unspecified: Secondary | ICD-10-CM

## 2017-07-23 DIAGNOSIS — I4891 Unspecified atrial fibrillation: Secondary | ICD-10-CM

## 2017-07-23 DIAGNOSIS — Z23 Encounter for immunization: Secondary | ICD-10-CM | POA: Diagnosis present

## 2017-07-23 DIAGNOSIS — D631 Anemia in chronic kidney disease: Secondary | ICD-10-CM | POA: Diagnosis present

## 2017-07-23 DIAGNOSIS — R2240 Localized swelling, mass and lump, unspecified lower limb: Secondary | ICD-10-CM

## 2017-07-23 DIAGNOSIS — C169 Malignant neoplasm of stomach, unspecified: Secondary | ICD-10-CM | POA: Diagnosis present

## 2017-07-23 DIAGNOSIS — N184 Chronic kidney disease, stage 4 (severe): Secondary | ICD-10-CM | POA: Diagnosis present

## 2017-07-23 DIAGNOSIS — Z66 Do not resuscitate: Secondary | ICD-10-CM | POA: Diagnosis present

## 2017-07-23 DIAGNOSIS — R6510 Systemic inflammatory response syndrome (SIRS) of non-infectious origin without acute organ dysfunction: Secondary | ICD-10-CM | POA: Diagnosis present

## 2017-07-23 LAB — LACTIC ACID, PLASMA: Lactic Acid, Venous: 1.6 mmol/L (ref 0.5–1.9)

## 2017-07-23 MED ORDER — METOPROLOL SUCCINATE ER 50 MG PO TB24
100.0000 mg | ORAL_TABLET | Freq: Every day | ORAL | Status: DC
  Administered 2017-07-23 – 2017-07-24 (×2): 100 mg via ORAL
  Filled 2017-07-23 (×2): qty 2

## 2017-07-23 MED ORDER — ACETAMINOPHEN 650 MG RE SUPP: 650.0000 mg | Freq: Four times a day (QID) | RECTAL | Status: DC | PRN

## 2017-07-23 MED ORDER — PNEUMOCOCCAL VAC POLYVALENT 25 MCG/0.5ML IJ INJ
0.5000 mL | INJECTION | INTRAMUSCULAR | Status: AC
  Administered 2017-07-24: 0.5 mL via INTRAMUSCULAR
  Filled 2017-07-23: qty 0.5

## 2017-07-23 MED ORDER — MORPHINE SULFATE (PF) 2 MG/ML IV SOLN: 2.0000 mg | INTRAVENOUS | Status: DC | PRN

## 2017-07-23 MED ORDER — FAMOTIDINE 20 MG PO TABS: 20.0000 mg | ORAL_TABLET | Freq: Two times a day (BID) | ORAL | Status: DC | PRN

## 2017-07-23 MED ORDER — ONDANSETRON HCL 4 MG/2ML IJ SOLN
4.0000 mg | Freq: Four times a day (QID) | INTRAMUSCULAR | Status: DC | PRN
  Administered 2017-07-23: 16:00:00 4 mg via INTRAVENOUS
  Filled 2017-07-23: qty 2

## 2017-07-23 MED ORDER — TRAZODONE HCL 50 MG PO TABS: 100.0000 mg | ORAL_TABLET | Freq: Every evening | ORAL | Status: DC | PRN

## 2017-07-23 MED ORDER — FUROSEMIDE 20 MG PO TABS
20.0000 mg | ORAL_TABLET | Freq: Every day | ORAL | Status: DC
  Administered 2017-07-23 – 2017-07-24 (×2): 20 mg via ORAL
  Filled 2017-07-23 (×2): qty 1

## 2017-07-23 MED ORDER — BISACODYL 5 MG PO TBEC: 5.0000 mg | DELAYED_RELEASE_TABLET | Freq: Every day | ORAL | Status: DC | PRN

## 2017-07-23 MED ORDER — RIVAROXABAN 15 MG PO TABS
15.0000 mg | ORAL_TABLET | Freq: Every day | ORAL | Status: DC
  Administered 2017-07-23 – 2017-07-24 (×2): 15 mg via ORAL
  Filled 2017-07-23 (×3): qty 1

## 2017-07-23 MED ORDER — PIPERACILLIN-TAZOBACTAM 3.375 G IVPB
3.3750 g | Freq: Three times a day (TID) | INTRAVENOUS | Status: DC
  Administered 2017-07-23 – 2017-07-24 (×4): 3.375 g via INTRAVENOUS
  Filled 2017-07-23 (×4): qty 50

## 2017-07-23 MED ORDER — SENNOSIDES-DOCUSATE SODIUM 8.6-50 MG PO TABS: 1.0000 | ORAL_TABLET | Freq: Every evening | ORAL | Status: DC | PRN

## 2017-07-23 MED ORDER — ACETAMINOPHEN 325 MG PO TABS: 650.0000 mg | ORAL_TABLET | Freq: Four times a day (QID) | ORAL | Status: DC | PRN

## 2017-07-23 MED ORDER — VANCOMYCIN HCL IN DEXTROSE 750-5 MG/150ML-% IV SOLN
750.0000 mg | INTRAVENOUS | Status: DC
  Administered 2017-07-23 – 2017-07-24 (×2): 750 mg via INTRAVENOUS
  Filled 2017-07-23 (×2): qty 150

## 2017-07-23 MED ORDER — MORPHINE SULFATE (PF) 4 MG/ML IV SOLN: 4.0000 mg | INTRAVENOUS | Status: DC | PRN

## 2017-07-23 MED ORDER — ONDANSETRON HCL 4 MG PO TABS: 4.0000 mg | ORAL_TABLET | Freq: Four times a day (QID) | ORAL | Status: DC | PRN

## 2017-07-23 MED ORDER — POTASSIUM CHLORIDE CRYS ER 20 MEQ PO TBCR
20.0000 meq | EXTENDED_RELEASE_TABLET | Freq: Every day | ORAL | Status: DC
  Administered 2017-07-23 – 2017-07-24 (×2): 20 meq via ORAL
  Filled 2017-07-23 (×2): qty 1

## 2017-07-23 NOTE — Progress Notes (Signed)
Hematology/Oncology Consult note [facility] Telephone:([phone] [phone]  Patient Care Team: Lynnell Jude, MD as PCP - General (Family Medicine)   Name of the patient: Mercedes Rodriguez  [id number]  01-Jul-1929   Date of visit: 07/23/17 REASON FOR COSULTATION:  Right lower extremity mass History of presenting illness-  84 y.o. female with PMH listed at below including CHF, chronic atrial fibrillation requiring anticoagulation who presents to ER for evaluation of worsening right lower extremity swelling.  She first noticed right lower extremity swelling 2 weeks ago.  She was seen and treated by PCP with oral antibiotics for presumed right lower extremity cellulitis.  However patient's symptoms persisted prompting her to go to emergency room. Work-up in the emergency room showed negative DVT on ultrasound.  CT scan of the right lower extremity demonstrated presence of large mass in the medial aspect of the distal thigh.  Oncology is consulted for further evaluation.  Patient denies any fall or trauma to the inner thigh region.  Denies any fever or chills.  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  Review of Systems  Constitutional: Positive for malaise/fatigue. Negative for chills and fever.  HENT: Negative for ear discharge and nosebleeds.   Eyes: Negative for blurred vision and double vision.  Respiratory: Negative for cough and shortness of breath.   Cardiovascular: Negative for chest pain.  Gastrointestinal: Negative for heartburn, nausea and vomiting.  Genitourinary: Negative for dysuria.  Musculoskeletal: Negative for myalgias.       Right leg swelling  Skin: Negative for rash.  Neurological: Negative for dizziness.  Endo/Heme/Allergies: Does not bruise/bleed easily.  Psychiatric/Behavioral: Negative for hallucinations.    No Known  Allergies  Patient Active Problem List   Diagnosis Date Noted  . Cellulitis of right lower extremity 07/23/2017  . GIB (gastrointestinal bleeding) 04/09/2017  . Bradycardia 02/21/2017  . Sinus bradycardia 02/20/2017  . HTN (hypertension) 01/29/2017  . Hard of hearing 01/21/2017  . Atrial fibrillation with RVR ([place])   . Palliative care encounter   . CHF (congestive heart failure) ([place]) 12/31/2016     Past Medical History:  Diagnosis Date  . Arrhythmia   . Atrial fibrillation ([place])   . CHF (congestive heart failure) ([place])   . Hearing loss   . Hypertension      Past Surgical History:  Procedure Laterality Date  . COLONOSCOPY N/A 04/12/2017   Procedure: COLONOSCOPY;  Surgeon: Toledo, Benay Pike, MD;  Location: [facility] ENDOSCOPY;  Service: Gastroenterology;  Laterality: N/A;  . ESOPHAGOGASTRODUODENOSCOPY N/A 04/12/2017   Procedure: ESOPHAGOGASTRODUODENOSCOPY (EGD);  Surgeon: Toledo, Benay Pike, MD;  Location: [facility] ENDOSCOPY;  Service: Gastroenterology;  Laterality: N/A;    Social History   Socioeconomic History  . Marital status: Widowed    Spouse name: Not on file  . Number of children: Not on file  . Years of education: Not on file  . Highest education level: Not on file  Occupational History  . Not on file  Social Needs  . Financial resource strain: Not on file  . Food insecurity:    Worry: Not on file    Inability: Not on file  . Transportation needs:    Medical: Not on file    Non-medical: Not on file  Tobacco Use  . Smoking status: Former Smoker    Types: Cigarettes    Last attempt to quit: 01/06/1993    Years since quitting: 24.5  . Smokeless  tobacco: Never Used  Substance and Sexual Activity  . Alcohol use: No  . Drug use: Not on file  . Sexual activity: Not Currently  Lifestyle  . Physical activity:    Days per week: Not on file    Minutes per session: Not on file  . Stress: Not on file  Relationships  . Social connections:    Talks on phone: Not on file      Gets together: Not on file    Attends religious service: Not on file    Active member of club or organization: Not on file    Attends meetings of clubs or organizations: Not on file    Relationship status: Not on file  . Intimate partner violence:    Fear of current or ex partner: Not on file    Emotionally abused: Not on file    Physically abused: Not on file    Forced sexual activity: Not on file  Other Topics Concern  . Not on file  Social History Narrative  . Not on file     History reviewed. No pertinent family history.   Current Facility-Administered Medications:  .  acetaminophen (TYLENOL) tablet 650 mg, 650 mg, Oral, Q6H PRN **OR** acetaminophen (TYLENOL) suppository 650 mg, 650 mg, Rectal, Q6H PRN, Jodell Cipro, Prasanna, MD .  bisacodyl (DULCOLAX) EC tablet 5 mg, 5 mg, Oral, Daily PRN, Jodell Cipro, Prasanna, MD .  famotidine (PEPCID) tablet 20 mg, 20 mg, Oral, BID PRN, Jodell Cipro, Prasanna, MD .  furosemide (LASIX) tablet 20 mg, 20 mg, Oral, Daily, Jodell Cipro, Prasanna, MD, 20 mg at 07/23/17 0927 .  metoprolol succinate (TOPROL-XL) 24 hr tablet 100 mg, 100 mg, Oral, Daily, Jodell Cipro, Prasanna, MD, 100 mg at 07/23/17 0927 .  morphine 2 MG/ML injection 2 mg, 2 mg, Intravenous, Q3H PRN **OR** morphine 4 MG/ML injection 4 mg, 4 mg, Intravenous, Q3H PRN, Jodell Cipro, Prasanna, MD .  ondansetron (ZOFRAN) tablet 4 mg, 4 mg, Oral, Q6H PRN **OR** ondansetron (ZOFRAN) injection 4 mg, 4 mg, Intravenous, Q6H PRN, Jodell Cipro, Prasanna, MD, 4 mg at 07/23/17 1556 .  piperacillin-tazobactam (ZOSYN) IVPB 3.375 g, 3.375 g, Intravenous, Q8H, Arta Silence, MD, Stopped at 07/23/17 1330 .  [START ON 07/24/2017] pneumococcal 23 valent vaccine (PNU-IMMUNE) injection 0.5 mL, 0.5 mL, Intramuscular, Tomorrow-1000, Sridharan, Prasanna, MD .  potassium chloride SA (K-DUR,KLOR-CON) CR tablet 20 mEq, 20 mEq, Oral, Daily, Jodell Cipro, Prasanna, MD, 20 mEq at 07/23/17 0927 .  Rivaroxaban (XARELTO) tablet 15 mg,  15 mg, Oral, Q supper, Jodell Cipro, Prasanna, MD .  senna-docusate (Senokot-S) tablet 1 tablet, 1 tablet, Oral, QHS PRN, Arta Silence, MD .  traZODone (DESYREL) tablet 100 mg, 100 mg, Oral, QHS PRN, Jodell Cipro, Prasanna, MD .  vancomycin (VANCOCIN) IVPB 750 mg/150 ml premix, 750 mg, Intravenous, Q24H, Arta Silence, MD, Stopped at 07/23/17 1110   Physical exam: ECOG  Vitals:   07/23/17 0207 07/23/17 0615 07/23/17 0926 07/23/17 1346  BP: 133/69 (!) 112/56 115/85 111/76  Pulse: (!) 103 100 (!) 108 (!) 106  Resp: 20   20  Temp: 98 F (36.7 C) 98.3 F (36.8 C)  98.6 F (37 C)  TempSrc: Oral Oral  Oral  SpO2: 99% 100%  100%  Weight:      Height:       Physical Exam  Constitutional: She is oriented to person, place, and time. No distress.  Frail appearance elderly lady  HENT:  Head: Normocephalic and atraumatic.  Mouth/Throat: No oropharyngeal exudate.  Eyes: Pupils are equal, round, and reactive  to light. EOM are normal. No scleral icterus.  Neck: Normal range of motion. Neck supple.  Cardiovascular: Normal rate.  Pulmonary/Chest: Effort normal. No respiratory distress.  Abdominal: Soft. She exhibits no distension. There is no tenderness.  Musculoskeletal: Normal range of motion.  Right lower thigh medial aspect fixed mass  Neurological: She is alert and oriented to person, place, and time.  Skin: Skin is warm.  Psychiatric: Affect normal.        CMP Latest Ref Rng & Units 07/22/2017  Glucose 70 - 99 mg/dL 98  BUN 8 - 23 mg/dL 25(H)  Creatinine 0.44 - 1.00 mg/dL 1.56(H)  Sodium 135 - 145 mmol/L 140  Potassium 3.5 - 5.1 mmol/L 4.3  Chloride 98 - 111 mmol/L 106  CO2 22 - 32 mmol/L 27  Calcium 8.9 - 10.3 mg/dL 8.6(L)  Total Protein 6.5 - 8.1 g/dL -  Total Bilirubin 0.3 - 1.2 mg/dL -  Alkaline Phos 38 - 126 U/L -  AST 15 - 41 U/L -  ALT 14 - 54 U/L -   CBC Latest Ref Rng & Units 07/22/2017  WBC 3.6 - 11.0 K/uL 9.5  Hemoglobin 12.0 - 16.0 g/dL 9.2(L)    Hematocrit 35.0 - 47.0 % 28.3(L)  Platelets 150 - 440 K/uL 209   RADIOGRAPHIC STUDIES: I have personally reviewed the radiological images as listed and agreed with the findings in the report.  Ct Femur Right W Contrast  Result Date: 07/22/2017 CLINICAL DATA:  Right leg pain. Patient denies trauma. Swelling of the mid thigh all the way down to the foot. EXAM: CT OF THE LOWER RIGHT EXTREMITY WITH CONTRAST TECHNIQUE: Multidetector CT imaging of the lower right extremity from hip through right foot was performed according to the standard protocol following intravenous contrast administration. COMPARISON:  None. CONTRAST:  8mL OMNIPAQUE IOHEXOL 300 MG/ML  SOLN FINDINGS: Bones/Joint/Cartilage 1. Right hip: The femoral head is spherical in appearance. No flattening or evidence of fracture. Mild degenerative joint space narrowing of the right hip. 2. Right femur: No acute fracture or suspicious osseous lesions. No frank bone destruction. 3. Right knee: Intact right total knee arthroplasty without complicating features. No loosening or joint effusion is identified. 4. Right tibia and fibula: Negative. Tibial tray of the right knee arthroplasty appears intact. 5. Patella: Intact patellar resurfacing.  No fracture. 6. Ankle joint: Intact without joint effusion. 7. Tibia and fibula: Intact without acute osseous abnormality. 8. Subtalar and midfoot articulations: Intact. 9. Right foot and ankle: Intact Ligaments Suboptimally assessed by CT. Muscles and Tendons Mild edema of the sartorius possibly related to muscle strain or tracking of subcutaneous soft tissue edema. Along the medial aspect of the right thigh, abutting and possibly arising from the sartorius is a masslike abnormality measuring 10.9 x 5.4 x 5.7 cm in craniocaudad by transverse by AP dimension. It contains central areas of hypodensity that may represent serous fluid from a large hematoma versus a soft tissue mass/sarcoma with central necrosis. Soft  tissues Circumferential soft tissue edema of the right leg, ankle and foot compatible with venous insufficiency and pitting edema versus cellulitis. Clinical correlation is therefore recommended. IMPRESSION: 1. The dominant finding is that of a soft tissue mass-like abnormality intimately associated with the mid to distal sartorius muscle measuring 10.9 x 5.4 x 5.7 cm. It contains central hypodensity that may represent central necrosis or serous fluid within. Differential possibilities may include a large soft tissue hematoma with variable degrees of liquefaction, soft tissue abscess or soft tissue mass/sarcoma  are some considerations. Short-term interval follow-up for reassessment is recommended. Should findings persist or worsen, percutaneous sampling may be necessary. 2. No acute osseous abnormality. 3. Soft tissue edema of the right leg, ankle and foot either representing stigmata of venous insufficiency and soft tissue pitting edema versus cellulitis among some possibilities. Electronically Signed   By: Ashley Royalty M.D.   On: 07/22/2017 23:32   Ct Tibia Fibula Right W Contrast  Result Date: 07/22/2017 CLINICAL DATA:  Right leg pain. Patient denies trauma. Swelling of the mid thigh all the way down to the foot. EXAM: CT OF THE LOWER RIGHT EXTREMITY WITH CONTRAST TECHNIQUE: Multidetector CT imaging of the lower right extremity from hip through right foot was performed according to the standard protocol following intravenous contrast administration. COMPARISON:  None. CONTRAST:  15mL OMNIPAQUE IOHEXOL 300 MG/ML  SOLN FINDINGS: Bones/Joint/Cartilage 1. Right hip: The femoral head is spherical in appearance. No flattening or evidence of fracture. Mild degenerative joint space narrowing of the right hip. 2. Right femur: No acute fracture or suspicious osseous lesions. No frank bone destruction. 3. Right knee: Intact right total knee arthroplasty without complicating features. No loosening or joint effusion is  identified. 4. Right tibia and fibula: Negative. Tibial tray of the right knee arthroplasty appears intact. 5. Patella: Intact patellar resurfacing.  No fracture. 6. Ankle joint: Intact without joint effusion. 7. Tibia and fibula: Intact without acute osseous abnormality. 8. Subtalar and midfoot articulations: Intact. 9. Right foot and ankle: Intact Ligaments Suboptimally assessed by CT. Muscles and Tendons Mild edema of the sartorius possibly related to muscle strain or tracking of subcutaneous soft tissue edema. Along the medial aspect of the right thigh, abutting and possibly arising from the sartorius is a masslike abnormality measuring 10.9 x 5.4 x 5.7 cm in craniocaudad by transverse by AP dimension. It contains central areas of hypodensity that may represent serous fluid from a large hematoma versus a soft tissue mass/sarcoma with central necrosis. Soft tissues Circumferential soft tissue edema of the right leg, ankle and foot compatible with venous insufficiency and pitting edema versus cellulitis. Clinical correlation is therefore recommended. IMPRESSION: 1. The dominant finding is that of a soft tissue mass-like abnormality intimately associated with the mid to distal sartorius muscle measuring 10.9 x 5.4 x 5.7 cm. It contains central hypodensity that may represent central necrosis or serous fluid within. Differential possibilities may include a large soft tissue hematoma with variable degrees of liquefaction, soft tissue abscess or soft tissue mass/sarcoma are some considerations. Short-term interval follow-up for reassessment is recommended. Should findings persist or worsen, percutaneous sampling may be necessary. 2. No acute osseous abnormality. 3. Soft tissue edema of the right leg, ankle and foot either representing stigmata of venous insufficiency and soft tissue pitting edema versus cellulitis among some possibilities. Electronically Signed   By: Ashley Royalty M.D.   On: 07/22/2017 23:32   US  Venous Img Lower Unilateral Right  Result Date: 07/22/2017 CLINICAL DATA:  Medial upper thigh pain and redness x2 weeks EXAM: RIGHT LOWER EXTREMITY VENOUS DOPPLER ULTRASOUND TECHNIQUE: Gray-scale sonography with compression, as well as color and duplex ultrasound, were performed to evaluate the deep venous system from the level of the common femoral vein through the popliteal and proximal calf veins. COMPARISON:  02/15/2001 by report only FINDINGS: Normal compressibility of the common femoral, superficial femoral, and popliteal veins, as well as the proximal calf veins. No filling defects to suggest DVT on grayscale or color Doppler imaging. Doppler waveforms show normal  direction of venous flow, normal respiratory phasicity and response to augmentation. Subcutaneous edema in the calf. Right inguinal lymph nodes up to 1.2 cm short axis diameter. There is a poorly marginated very hypoechoic region in the deep subcutaneous tissues of the medial right thigh measuring 9.2 x 3.9 x 5.7 cm showing hyperemia on color Doppler. Survey views of the contralateral common femoral vein are unremarkable. IMPRESSION: 1.  No evidence of right lower extremity deep vein thrombosis. 2. 9.2 cm right medial thigh lesion more concerning for neoplasm than abscess, corresponding to region of concern. Consider MR with contrast for further characterization. Electronically Signed   By: Lucrezia Europe M.D.   On: 07/22/2017 19:56    Assessment and plan- Patient is a 84 y.o. female with past medical history of CHF, A. fib on chronic anticoagulation present for evaluation of right lower extremity swelling.  #Right thigh mass,  Ultrasound and CT images were independently reviewed and discussed with patient.  Concerning soft tissue mass,?  Sarcoma versus hematoma.  Patient is afebrile, no leukocytosis.  Less likely abscess Recommend MRI for further evaluation,  Management pending on work-up studies. Likely need Orthopedic oncology team for  evaluation if resectable or unresectable. May need neoadjuvant RT.  Outpatient follow up.    Thank you for allowing me to participate in the care of this patient.  Total face to face encounter time for this patient visit was 70 min. >50% of the time was  spent in counseling and coordination of care.    Earlie Server, MD, PhD Hematology Oncology [facility] Pager- [phone] 07/23/2017

## 2017-07-23 NOTE — Progress Notes (Signed)
Advanced care plan. Purpose of the Encounter: CODE STATUS Parties in Attendance:Patient Patient's Decision Capacity:Good Subjective/Patient's story: Presented to ER with right leg pain Objective/Medical story Has right leg cellulitis and right thigh mass Needs work up for malignancy Goals of care determination:  Advance care directives and goals of care discussed Patient does not want cpr, intubation and ventilator if need arises CODE STATUS: DNR Time spent discussing advanced care planning: 16 minutes

## 2017-07-23 NOTE — H&P (Signed)
[facility] NAME: Mercedes Rodriguez    MR#:  [id number]  DATE OF BIRTH:  29-Sep-1929  DATE OF ADMISSION:  07/22/2017  PRIMARY CARE PHYSICIAN: Lynnell Jude, MD   REQUESTING/REFERRING PHYSICIAN: Paulette Blanch, MD  CHIEF COMPLAINT:   Chief Complaint  Patient presents with  . Leg Pain    HISTORY OF PRESENT ILLNESS:  Mercedes Rodriguez  is a 84 y.o. female with a known history of Afib (Xarelto), recent Dx gastric Ca p/w RLE erythema/induration. Pt hard of hearing. States she uses 2L O2 [place] qHS/PRN at baseline. Endorses 1.5wk Hx R inner thigh erythema/induration, which she states has progressively increased in size since onset. She states she saw a physician in the interrim, and was prescribed outpt PO ABx (Augmentin I believe), but she states the aforementioned symptoms have gotten worse despite taking the prescribed ABx.  She denies pain/tenderness at the site, but states it has intermittently been tender/painful. Denies ambulatory dysfxn. Denies purulent/serosanguinous discharge from the site. Denies fever/chills, diaphoresis, night sweats or rigors. She states she is otherwise feeling well. She states she came to the ED at the behest of her granddaughter, who was justifiably alarmed at persistence/worsening of symptoms despite outpt therapy. SIRS (-). Lactate WNL (1.6). Contrast CT of RLE demonstrates, "a soft tissue mass-like abnormality intimately associated with the mid to distal sartorius muscle measuring 10.9 x 5.4 x 5.7 cm. It contains central hypodensity that may represent central necrosis or serous fluid within. Differential possibilities may include a large soft tissue hematoma with variable degrees of liquefaction, soft tissue abscess or soft tissue mass/sarcoma are some considerations." Started on Vancomycin/Zosyn in ED. Pt comfortable, in no distress, denies pain, does not appear septic/toxic.  04/06/2017 Echo demonstrated, "NORMAL LEFT  VENTRICULAR SYSTOLIC FUNCTION WITH MILD LVH, NORMAL RIGHT VENTRICULAR SYSTOLIC FUNCTION, MILD VALVULAR REGURGITATION, NO VALVULAR STENOSIS, MILD to MODERATE AR + TR, MILD MR + PR, MODERATE PHTN, EF 55%".  PAST MEDICAL HISTORY:   Past Medical History:  Diagnosis Date  . Arrhythmia   . Atrial fibrillation ([place])   . CHF (congestive heart failure) ([place])   . Hearing loss   . Hypertension     PAST SURGICAL HISTORY:   Past Surgical History:  Procedure Laterality Date  . COLONOSCOPY N/A 04/12/2017   Procedure: COLONOSCOPY;  Surgeon: Toledo, Benay Pike, MD;  Location: [facility] ENDOSCOPY;  Service: Gastroenterology;  Laterality: N/A;  . ESOPHAGOGASTRODUODENOSCOPY N/A 04/12/2017   Procedure: ESOPHAGOGASTRODUODENOSCOPY (EGD);  Surgeon: Toledo, Benay Pike, MD;  Location: [facility] ENDOSCOPY;  Service: Gastroenterology;  Laterality: N/A;    SOCIAL HISTORY:   Social History   Tobacco Use  . Smoking status: Former Smoker    Types: Cigarettes    Last attempt to quit: 01/06/1993    Years since quitting: 24.5  . Smokeless tobacco: Never Used  Substance Use Topics  . Alcohol use: No    FAMILY HISTORY:  History reviewed. No pertinent family history.  DRUG ALLERGIES:  No Known Allergies  REVIEW OF SYSTEMS:   Review of Systems  Constitutional: Negative for chills, diaphoresis, fever, malaise/fatigue and weight loss.  HENT: Negative for congestion, ear pain, hearing loss, nosebleeds, sinus pain, sore throat and tinnitus.   Eyes: Negative for blurred vision, double vision and photophobia.  Respiratory: Negative for cough, hemoptysis, sputum production, shortness of breath and wheezing.   Cardiovascular: Positive for leg swelling (+) R thigh/RLE swelling. Negative for chest pain, palpitations, orthopnea, claudication and PND.  Gastrointestinal: Negative  for abdominal pain, blood in stool, constipation, diarrhea, heartburn, melena, nausea and vomiting.  Genitourinary: Negative for dysuria, frequency,  hematuria and urgency.  Musculoskeletal: Negative for back pain, joint pain, myalgias and neck pain.  Skin: Positive for rash (+) R inner thigh erythema/induration. Negative for itching.  Neurological: Negative for dizziness, tingling, tremors, sensory change, speech change, focal weakness, seizures, loss of consciousness, weakness and headaches.  Psychiatric/Behavioral: Negative for depression and memory loss. The patient does not have insomnia.    MEDICATIONS AT HOME:   Prior to Admission medications   Medication Sig Start Date End Date Taking? Authorizing Provider  furosemide (LASIX) 40 MG tablet Take 1 tablet (40 mg total) by mouth daily. Patient taking differently: Take 20 mg by mouth daily.  02/23/17  Yes Wieting, Richard, MD  metoprolol succinate (TOPROL-XL) 100 MG 24 hr tablet Take 100 mg by mouth daily. Take with or immediately following a meal.   Yes [provider]  potassium chloride SA (K-DUR,KLOR-CON) 20 MEQ tablet Take 1 tablet (20 mEq total) by mouth daily. 02/23/17  Yes Wieting, Richard, MD  ranitidine (ZANTAC) 150 MG tablet Take 150 mg by mouth 2 (two) times daily as needed for heartburn.   Yes [provider]  Rivaroxaban (XARELTO) 15 MG TABS tablet Take 1 tablet (15 mg total) by mouth daily with supper. 02/23/17  Yes Wieting, Richard, MD  triamcinolone cream (KENALOG) 0.1 % Apply 1 application topically 2 (two) times daily.   Yes [provider]  acetaminophen (TYLENOL) 325 MG tablet Take 2 tablets (650 mg total) by mouth every 6 (six) hours as needed for mild pain (or Fever >/= 101). 01/07/17   [name], MD  traZODone (DESYREL) 100 MG tablet Take 1 tablet (100 mg total) by mouth at bedtime as needed for sleep. Patient taking differently: Take 100 mg by mouth at bedtime.  02/23/17   Loletha Grayer, MD      VITAL SIGNS:  Blood pressure (!) 142/61, pulse (!) 106, temperature 98.6 F (37 C), temperature source Oral, resp. rate 18, height 5\' 2"   (1.575 m), weight 70.8 kg (156 lb), SpO2 100 %.  PHYSICAL EXAMINATION:  Physical Exam  Constitutional: She is oriented to person, place, and time. She appears well-developed and well-nourished. She is active and cooperative.  Non-toxic appearance. She does not have a sickly appearance. She does not appear ill. No distress. She is not intubated. Nasal cannula in place.  HENT:  Head: Normocephalic and atraumatic.  Mouth/Throat: Oropharynx is clear and moist. No oropharyngeal exudate.  Eyes: Conjunctivae, EOM and lids are normal. No scleral icterus.  Neck: Neck supple. No JVD present. No thyromegaly present.  Cardiovascular: S1 normal and S2 normal. An irregularly irregular rhythm present. Tachycardia present. Exam reveals no gallop, no S3, no S4, no distant heart sounds and no friction rub.  Murmur heard.  Systolic murmur is present with a grade of 3/6. Pulmonary/Chest: Effort normal. No accessory muscle usage or stridor. No apnea, no tachypnea and no bradypnea. She is not intubated. No respiratory distress. She has no decreased breath sounds. She has no wheezes. She has no rhonchi. She has rales in the right lower field and the left lower field.  Bibasilar fine crackles.  Abdominal: Soft. Bowel sounds are normal. She exhibits no distension. There is no tenderness. There is no rebound and no guarding.  Musculoskeletal: Normal range of motion. She exhibits edema (+) RLE/R inner thigh erythema/induration/swelling, (+) RLE pretibial pitting edema 1+ (unilateral). She exhibits no tenderness.  Lymphadenopathy:    She has no cervical adenopathy.  Neurological: She is alert and oriented to person, place, and time. She is not disoriented.  Hard of hearing.  Skin: Skin is warm and dry. Rash (+) RLE/R inner thigh erythema/induration/swelling noted. She is not diaphoretic. There is erythema (+) RLE/R inner thigh erythema/induration/swelling.  Psychiatric: She has a normal mood and affect. Her behavior is  normal. Judgment and thought content normal.   LABORATORY PANEL:   CBC Recent Labs  Lab 07/22/17 2101  WBC 9.5  HGB 9.2*  HCT 28.3*  PLT 209   ------------------------------------------------------------------------------------------------------------------  Chemistries  Recent Labs  Lab 07/22/17 2101  NA 140  K 4.3  CL 106  CO2 27  GLUCOSE 98  BUN 25*  CREATININE 1.56*  CALCIUM 8.6*   ------------------------------------------------------------------------------------------------------------------  Cardiac Enzymes No results for input(s): TROPONINI in the last 168 hours. ------------------------------------------------------------------------------------------------------------------  RADIOLOGY:  Ct Femur Right W Contrast  Result Date: 07/22/2017 CLINICAL DATA:  Right leg pain. Patient denies trauma. Swelling of the mid thigh all the way down to the foot. EXAM: CT OF THE LOWER RIGHT EXTREMITY WITH CONTRAST TECHNIQUE: Multidetector CT imaging of the lower right extremity from hip through right foot was performed according to the standard protocol following intravenous contrast administration. COMPARISON:  None. CONTRAST:  23mL OMNIPAQUE IOHEXOL 300 MG/ML  SOLN FINDINGS: Bones/Joint/Cartilage 1. Right hip: The femoral head is spherical in appearance. No flattening or evidence of fracture. Mild degenerative joint space narrowing of the right hip. 2. Right femur: No acute fracture or suspicious osseous lesions. No frank bone destruction. 3. Right knee: Intact right total knee arthroplasty without complicating features. No loosening or joint effusion is identified. 4. Right tibia and fibula: Negative. Tibial tray of the right knee arthroplasty appears intact. 5. Patella: Intact patellar resurfacing.  No fracture. 6. Ankle joint: Intact without joint effusion. 7. Tibia and fibula: Intact without acute osseous abnormality. 8. Subtalar and midfoot articulations: Intact. 9. Right foot  and ankle: Intact Ligaments Suboptimally assessed by CT. Muscles and Tendons Mild edema of the sartorius possibly related to muscle strain or tracking of subcutaneous soft tissue edema. Along the medial aspect of the right thigh, abutting and possibly arising from the sartorius is a masslike abnormality measuring 10.9 x 5.4 x 5.7 cm in craniocaudad by transverse by AP dimension. It contains central areas of hypodensity that may represent serous fluid from a large hematoma versus a soft tissue mass/sarcoma with central necrosis. Soft tissues Circumferential soft tissue edema of the right leg, ankle and foot compatible with venous insufficiency and pitting edema versus cellulitis. Clinical correlation is therefore recommended. IMPRESSION: 1. The dominant finding is that of a soft tissue mass-like abnormality intimately associated with the mid to distal sartorius muscle measuring 10.9 x 5.4 x 5.7 cm. It contains central hypodensity that may represent central necrosis or serous fluid within. Differential possibilities may include a large soft tissue hematoma with variable degrees of liquefaction, soft tissue abscess or soft tissue mass/sarcoma are some considerations. Short-term interval follow-up for reassessment is recommended. Should findings persist or worsen, percutaneous sampling may be necessary. 2. No acute osseous abnormality. 3. Soft tissue edema of the right leg, ankle and foot either representing stigmata of venous insufficiency and soft tissue pitting edema versus cellulitis among some possibilities. Electronically Signed   By: Ashley Royalty M.D.   On: 07/22/2017 23:32   Ct Tibia Fibula Right W Contrast  Result Date: 07/22/2017 CLINICAL DATA:  Right leg pain. Patient denies trauma.  Swelling of the mid thigh all the way down to the foot. EXAM: CT OF THE LOWER RIGHT EXTREMITY WITH CONTRAST TECHNIQUE: Multidetector CT imaging of the lower right extremity from hip through right foot was performed according to  the standard protocol following intravenous contrast administration. COMPARISON:  None. CONTRAST:  80mL OMNIPAQUE IOHEXOL 300 MG/ML  SOLN FINDINGS: Bones/Joint/Cartilage 1. Right hip: The femoral head is spherical in appearance. No flattening or evidence of fracture. Mild degenerative joint space narrowing of the right hip. 2. Right femur: No acute fracture or suspicious osseous lesions. No frank bone destruction. 3. Right knee: Intact right total knee arthroplasty without complicating features. No loosening or joint effusion is identified. 4. Right tibia and fibula: Negative. Tibial tray of the right knee arthroplasty appears intact. 5. Patella: Intact patellar resurfacing.  No fracture. 6. Ankle joint: Intact without joint effusion. 7. Tibia and fibula: Intact without acute osseous abnormality. 8. Subtalar and midfoot articulations: Intact. 9. Right foot and ankle: Intact Ligaments Suboptimally assessed by CT. Muscles and Tendons Mild edema of the sartorius possibly related to muscle strain or tracking of subcutaneous soft tissue edema. Along the medial aspect of the right thigh, abutting and possibly arising from the sartorius is a masslike abnormality measuring 10.9 x 5.4 x 5.7 cm in craniocaudad by transverse by AP dimension. It contains central areas of hypodensity that may represent serous fluid from a large hematoma versus a soft tissue mass/sarcoma with central necrosis. Soft tissues Circumferential soft tissue edema of the right leg, ankle and foot compatible with venous insufficiency and pitting edema versus cellulitis. Clinical correlation is therefore recommended. IMPRESSION: 1. The dominant finding is that of a soft tissue mass-like abnormality intimately associated with the mid to distal sartorius muscle measuring 10.9 x 5.4 x 5.7 cm. It contains central hypodensity that may represent central necrosis or serous fluid within. Differential possibilities may include a large soft tissue hematoma with  variable degrees of liquefaction, soft tissue abscess or soft tissue mass/sarcoma are some considerations. Short-term interval follow-up for reassessment is recommended. Should findings persist or worsen, percutaneous sampling may be necessary. 2. No acute osseous abnormality. 3. Soft tissue edema of the right leg, ankle and foot either representing stigmata of venous insufficiency and soft tissue pitting edema versus cellulitis among some possibilities. Electronically Signed   By: Ashley Royalty M.D.   On: 07/22/2017 23:32   US Venous Img Lower Unilateral Right  Result Date: 07/22/2017 CLINICAL DATA:  Medial upper thigh pain and redness x2 weeks EXAM: RIGHT LOWER EXTREMITY VENOUS DOPPLER ULTRASOUND TECHNIQUE: Gray-scale sonography with compression, as well as color and duplex ultrasound, were performed to evaluate the deep venous system from the level of the common femoral vein through the popliteal and proximal calf veins. COMPARISON:  02/15/2001 by report only FINDINGS: Normal compressibility of the common femoral, superficial femoral, and popliteal veins, as well as the proximal calf veins. No filling defects to suggest DVT on grayscale or color Doppler imaging. Doppler waveforms show normal direction of venous flow, normal respiratory phasicity and response to augmentation. Subcutaneous edema in the calf. Right inguinal lymph nodes up to 1.2 cm short axis diameter. There is a poorly marginated very hypoechoic region in the deep subcutaneous tissues of the medial right thigh measuring 9.2 x 3.9 x 5.7 cm showing hyperemia on color Doppler. Survey views of the contralateral common femoral vein are unremarkable. IMPRESSION: 1.  No evidence of right lower extremity deep vein thrombosis. 2. 9.2 cm right medial thigh lesion more  concerning for neoplasm than abscess, corresponding to region of concern. Consider MR with contrast for further characterization. Electronically Signed   By: Lucrezia Europe M.D.   On: 07/22/2017  19:56   IMPRESSION AND PLAN:   A/P: 20F RLE (R inner thigh) cellulitis w/ underlying soft tissue abnl, abscess vs. neoplasm vs. other. Also w/ elevated Cr/CKD III-IV (baseline Cr 1.5-1.9), normocytic anemia. -SIRS (-) -Afebrile, (-) leukocytosis -RLE (R inner thigh) cellulitis w/ underlying soft tissue abnl, imaging concerning for abscess vs. neoplasm (suspect neoplasm) -It is reported pt cannot have MRI due to recent gastric clip -Had contrast CT in ED, but may need advanced imaging -Likely to benefit from subspecialty/surgical consultation, will likely need Bx of lesion -ABx (Vanc + Zosyn) -BCx x2 pending -Can likely de-escalate ABx -Symptomatic mgmt, pain ctrl -Cr 1.56 on present admission, baseline CKD III-IV (baseline Cr 1.5-1.9 based on review of prior labs), at baseline. Received IV contrast for CT. Monitor BMP, avoid further nephrotoxic agents -Normocytic anemia likely anemia of chronic disease, Hgb stable since 04/13/2017, no evidence of acute blood loss -c/w home meds -Cardiac diet -Xarelto -DNR/DNI -Admission, > 2 midnights   All the records are reviewed and case discussed with ED provider. Management plans discussed with the patient, family and they are in agreement.  CODE STATUS: DNR/DNI  TOTAL TIME TAKING CARE OF THIS PATIENT: 90 minutes.    Arta Silence M.D on 07/23/2017 at 1:44 AM  Between 7am to 6pm - Pager - [phone]  After 6pm go to [email or web address] - Proofreader  [facility]  Office  [phone]  CC: Primary care physician; Lynnell Jude, MD   Note: This dictation was prepared with Dragon dictation along with smaller phrase technology. Any transcriptional errors that result from this process are unintentional.

## 2017-07-23 NOTE — Consult Note (Signed)
ORTHOPAEDIC CONSULTATION  REQUESTING PHYSICIAN: Saundra Shelling, MD  Chief Complaint:   Right thigh mass.  History of Present Illness: Mercedes Rodriguez is a 84 y.o. female with a history of hypertension, congestive heart failure, and chronic atrial fibrillation requiring anticoagulation who lives independently.  The patient presented to the emergency room last evening with a 2-week history of worsening right lower extremity swelling.  She had been treated by her primary care provider with 2 courses of amoxicillin for presumed right lower extremity cellulitis, but her symptoms have persisted, prompting her to present to the emergency room.  Work-up in the emergency room included a Doppler ultrasound to rule out DVT, which was negative, as well as a CT scan of the right lower extremity which demonstrated the presence of a large mass in the medial aspect of the distal thigh.  The patient was admitted for further evaluation and treatment of this mass.    The patient does not recall any trauma to the inner thigh region that may have precipitated the development of this mass.  The patient denies any fevers or chills while at home, and has been afebrile since his presentation to the emergency room.  In addition, her white count is within normal limits at 9.5 and she demonstrates no left shift with 68% neutrophils, 15% lymphocytes, 10% monocytes, 6% eosinophils, and 1% basophils.  The patient also denies any recent issues with weight loss.  However, she has undergone a recent GI work-up which apparently demonstrated a small area of adenocarcinoma, requiring her to undergo an upper endoscopy for resection of this area earlier this week.  The patient apparently tolerated this procedure without any complications.  Past Medical History:  Diagnosis Date  . Arrhythmia   . Atrial fibrillation ([place])   . CHF (congestive heart failure) ([place])   . Hearing  loss   . Hypertension    Past Surgical History:  Procedure Laterality Date  . COLONOSCOPY N/A 04/12/2017   Procedure: COLONOSCOPY;  Surgeon: Toledo, Benay Pike, MD;  Location: [facility] ENDOSCOPY;  Service: Gastroenterology;  Laterality: N/A;  . ESOPHAGOGASTRODUODENOSCOPY N/A 04/12/2017   Procedure: ESOPHAGOGASTRODUODENOSCOPY (EGD);  Surgeon: Toledo, Benay Pike, MD;  Location: [facility] ENDOSCOPY;  Service: Gastroenterology;  Laterality: N/A;   Social History   Socioeconomic History  . Marital status: Widowed    Spouse name: Not on file  . Number of children: Not on file  . Years of education: Not on file  . Highest education level: Not on file  Occupational History  . Not on file  Social Needs  . Financial resource strain: Not on file  . Food insecurity:    Worry: Not on file    Inability: Not on file  . Transportation needs:    Medical: Not on file    Non-medical: Not on file  Tobacco Use  . Smoking status: Former Smoker    Types: Cigarettes    Last attempt to quit: 01/06/1993    Years since quitting: 24.5  . Smokeless tobacco: Never Used  Substance and Sexual Activity  . Alcohol use: No  . Drug use: Not on file  . Sexual activity: Not Currently  Lifestyle  . Physical activity:    Days per week: Not on file    Minutes per session: Not on file  . Stress: Not on file  Relationships  . Social connections:    Talks on phone: Not on file    Gets together: Not on file    Attends religious service: Not on  file    Active member of club or organization: Not on file    Attends meetings of clubs or organizations: Not on file    Relationship status: Not on file  Other Topics Concern  . Not on file  Social History Narrative  . Not on file   History reviewed. No pertinent family history. No Known Allergies Prior to Admission medications   Medication Sig Start Date End Date Taking? Authorizing Provider  furosemide (LASIX) 40 MG tablet Take 1 tablet (40 mg total) by mouth daily. Patient  taking differently: Take 20 mg by mouth daily.  02/23/17  Yes Wieting, Richard, MD  metoprolol succinate (TOPROL-XL) 100 MG 24 hr tablet Take 100 mg by mouth daily. Take with or immediately following a meal.   Yes [provider]  potassium chloride SA (K-DUR,KLOR-CON) 20 MEQ tablet Take 1 tablet (20 mEq total) by mouth daily. 02/23/17  Yes Wieting, Richard, MD  ranitidine (ZANTAC) 150 MG tablet Take 150 mg by mouth 2 (two) times daily as needed for heartburn.   Yes [provider]  Rivaroxaban (XARELTO) 15 MG TABS tablet Take 1 tablet (15 mg total) by mouth daily with supper. 02/23/17  Yes Wieting, Richard, MD  triamcinolone cream (KENALOG) 0.1 % Apply 1 application topically 2 (two) times daily.   Yes [provider]  acetaminophen (TYLENOL) 325 MG tablet Take 2 tablets (650 mg total) by mouth every 6 (six) hours as needed for mild pain (or Fever >/= 101). 01/07/17   [name], MD  traZODone (DESYREL) 100 MG tablet Take 1 tablet (100 mg total) by mouth at bedtime as needed for sleep. Patient taking differently: Take 100 mg by mouth at bedtime.  02/23/17   Loletha Grayer, MD   Ct Femur Right W Contrast  Result Date: 07/22/2017 CLINICAL DATA:  Right leg pain. Patient denies trauma. Swelling of the mid thigh all the way down to the foot. EXAM: CT OF THE LOWER RIGHT EXTREMITY WITH CONTRAST TECHNIQUE: Multidetector CT imaging of the lower right extremity from hip through right foot was performed according to the standard protocol following intravenous contrast administration. COMPARISON:  None. CONTRAST:  44mL OMNIPAQUE IOHEXOL 300 MG/ML  SOLN FINDINGS: Bones/Joint/Cartilage 1. Right hip: The femoral head is spherical in appearance. No flattening or evidence of fracture. Mild degenerative joint space narrowing of the right hip. 2. Right femur: No acute fracture or suspicious osseous lesions. No frank bone destruction. 3. Right knee: Intact right total knee arthroplasty without  complicating features. No loosening or joint effusion is identified. 4. Right tibia and fibula: Negative. Tibial tray of the right knee arthroplasty appears intact. 5. Patella: Intact patellar resurfacing.  No fracture. 6. Ankle joint: Intact without joint effusion. 7. Tibia and fibula: Intact without acute osseous abnormality. 8. Subtalar and midfoot articulations: Intact. 9. Right foot and ankle: Intact Ligaments Suboptimally assessed by CT. Muscles and Tendons Mild edema of the sartorius possibly related to muscle strain or tracking of subcutaneous soft tissue edema. Along the medial aspect of the right thigh, abutting and possibly arising from the sartorius is a masslike abnormality measuring 10.9 x 5.4 x 5.7 cm in craniocaudad by transverse by AP dimension. It contains central areas of hypodensity that may represent serous fluid from a large hematoma versus a soft tissue mass/sarcoma with central necrosis. Soft tissues Circumferential soft tissue edema of the right leg, ankle and foot compatible with venous insufficiency and pitting edema versus cellulitis. Clinical correlation is therefore recommended. IMPRESSION: 1. The dominant  finding is that of a soft tissue mass-like abnormality intimately associated with the mid to distal sartorius muscle measuring 10.9 x 5.4 x 5.7 cm. It contains central hypodensity that may represent central necrosis or serous fluid within. Differential possibilities may include a large soft tissue hematoma with variable degrees of liquefaction, soft tissue abscess or soft tissue mass/sarcoma are some considerations. Short-term interval follow-up for reassessment is recommended. Should findings persist or worsen, percutaneous sampling may be necessary. 2. No acute osseous abnormality. 3. Soft tissue edema of the right leg, ankle and foot either representing stigmata of venous insufficiency and soft tissue pitting edema versus cellulitis among some possibilities. Electronically Signed    By: Ashley Royalty M.D.   On: 07/22/2017 23:32   Ct Tibia Fibula Right W Contrast  Result Date: 07/22/2017 CLINICAL DATA:  Right leg pain. Patient denies trauma. Swelling of the mid thigh all the way down to the foot. EXAM: CT OF THE LOWER RIGHT EXTREMITY WITH CONTRAST TECHNIQUE: Multidetector CT imaging of the lower right extremity from hip through right foot was performed according to the standard protocol following intravenous contrast administration. COMPARISON:  None. CONTRAST:  42mL OMNIPAQUE IOHEXOL 300 MG/ML  SOLN FINDINGS: Bones/Joint/Cartilage 1. Right hip: The femoral head is spherical in appearance. No flattening or evidence of fracture. Mild degenerative joint space narrowing of the right hip. 2. Right femur: No acute fracture or suspicious osseous lesions. No frank bone destruction. 3. Right knee: Intact right total knee arthroplasty without complicating features. No loosening or joint effusion is identified. 4. Right tibia and fibula: Negative. Tibial tray of the right knee arthroplasty appears intact. 5. Patella: Intact patellar resurfacing.  No fracture. 6. Ankle joint: Intact without joint effusion. 7. Tibia and fibula: Intact without acute osseous abnormality. 8. Subtalar and midfoot articulations: Intact. 9. Right foot and ankle: Intact Ligaments Suboptimally assessed by CT. Muscles and Tendons Mild edema of the sartorius possibly related to muscle strain or tracking of subcutaneous soft tissue edema. Along the medial aspect of the right thigh, abutting and possibly arising from the sartorius is a masslike abnormality measuring 10.9 x 5.4 x 5.7 cm in craniocaudad by transverse by AP dimension. It contains central areas of hypodensity that may represent serous fluid from a large hematoma versus a soft tissue mass/sarcoma with central necrosis. Soft tissues Circumferential soft tissue edema of the right leg, ankle and foot compatible with venous insufficiency and pitting edema versus cellulitis.  Clinical correlation is therefore recommended. IMPRESSION: 1. The dominant finding is that of a soft tissue mass-like abnormality intimately associated with the mid to distal sartorius muscle measuring 10.9 x 5.4 x 5.7 cm. It contains central hypodensity that may represent central necrosis or serous fluid within. Differential possibilities may include a large soft tissue hematoma with variable degrees of liquefaction, soft tissue abscess or soft tissue mass/sarcoma are some considerations. Short-term interval follow-up for reassessment is recommended. Should findings persist or worsen, percutaneous sampling may be necessary. 2. No acute osseous abnormality. 3. Soft tissue edema of the right leg, ankle and foot either representing stigmata of venous insufficiency and soft tissue pitting edema versus cellulitis among some possibilities. Electronically Signed   By: Ashley Royalty M.D.   On: 07/22/2017 23:32   US Venous Img Lower Unilateral Right  Result Date: 07/22/2017 CLINICAL DATA:  Medial upper thigh pain and redness x2 weeks EXAM: RIGHT LOWER EXTREMITY VENOUS DOPPLER ULTRASOUND TECHNIQUE: Gray-scale sonography with compression, as well as color and duplex ultrasound, were performed to evaluate the  deep venous system from the level of the common femoral vein through the popliteal and proximal calf veins. COMPARISON:  02/15/2001 by report only FINDINGS: Normal compressibility of the common femoral, superficial femoral, and popliteal veins, as well as the proximal calf veins. No filling defects to suggest DVT on grayscale or color Doppler imaging. Doppler waveforms show normal direction of venous flow, normal respiratory phasicity and response to augmentation. Subcutaneous edema in the calf. Right inguinal lymph nodes up to 1.2 cm short axis diameter. There is a poorly marginated very hypoechoic region in the deep subcutaneous tissues of the medial right thigh measuring 9.2 x 3.9 x 5.7 cm showing hyperemia on color  Doppler. Survey views of the contralateral common femoral vein are unremarkable. IMPRESSION: 1.  No evidence of right lower extremity deep vein thrombosis. 2. 9.2 cm right medial thigh lesion more concerning for neoplasm than abscess, corresponding to region of concern. Consider MR with contrast for further characterization. Electronically Signed   By: Lucrezia Europe M.D.   On: 07/22/2017 19:56    Positive ROS: All other systems have been reviewed and were otherwise negative with the exception of those mentioned in the HPI and as above.  Physical Exam: General:  Alert, no acute distress Psychiatric:  Patient is competent for consent with normal mood and affect   Cardiovascular:  No pedal edema Respiratory:  No wheezing, non-labored breathing GI:  Abdomen is soft and non-tender Skin:  No lesions in the area of chief complaint Neurologic:  Sensation intact distally Lymphatic:  No axillary or cervical lymphadenopathy  Orthopedic Exam:  Orthopedic examination is limited to the right lower extremity.  There is a large firm mildly tender mass in the medial aspect of the lower thigh measuring approximately 3 x 5 inches and oriented longitudinally.  There is mild warmth in the area, but no obvious erythema.  Skin inspection also was notable for well-healed surgical incision over the anterior aspect of the right knee, consistent with her history of a prior total knee arthroplasty performed by Dr. Marry Guan many years ago.  There is no erythema, warmth, or swelling around the knee, and there is no effusion.  She is able to actively flex and extend her knee from 0 to 110 degrees without any pain.  Her patella tracks well.  There is no ligamentous laxity.  She is neurovascularly intact to the right lower extremity and foot.  She has good strength with resisted knee flexion as well as with right leg adduction.  X-rays:  A recent CT scan of the right lower extremity is available for review.  The findings are as  described above.  Assessment: Right medial thigh mass of unclear etiology.  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  Therefore, I agree with proceeding with an MRI scan with contrast to better evaluate this mass.  I have asked the patient's nurse to try to contact the GI physician who performed the patient's upper endoscopy earlier this week in [place] to determine if the clip placed is MRI compatible.  If it is, then I would proceed with the MRI scan.  Given that the patient does not appear to be septic, she has been afebrile, and her labs show no evidence for elevated white count or left shift, it is unlikely that this mass is an abscess.  If it is a spontaneous hematoma, the patient does not recall any trauma  to this area.  This would be managed nonsurgically with a heating pad, reversal of her anticoagulation, and allowing the body to gradually resorb the blood.  If the MRI scan can be obtained and does exhibit findings consistent with a sarcoma, the patient would best be managed by referring [facility] to [facility] or to [facility] to the orthopedic oncology team.  It is generally accepted that the surgical team who would perform the definitive resection procedure is to be the one to do any open or percutaneous biopsy so as to minimize any contamination of adjacent tissues which could compromise the successful excision of the sarcoma.  Thank you for asked me to participate in the care of this most pleasant yet unfortunate woman.  I will be happy to follow her with you.   Pascal Lux, MD  Beeper #:  [phone]  07/23/2017 4:38 PM

## 2017-07-23 NOTE — Progress Notes (Signed)
[facility] [place] NAME: Mercedes Rodriguez    MR#:  [id number]  DATE OF BIRTH:  05/29/1929  SUBJECTIVE:  CHIEF COMPLAINT:   Chief Complaint  Mercedes Rodriguez presents with  . Leg Pain  Mercedes Rodriguez seen and evaluated today Has pain in the right thigh No fever and chills  REVIEW OF SYSTEMS:    ROS  CONSTITUTIONAL: No documented fever. No fatigue, weakness. No weight gain, no weight loss.  EYES: No blurry or double vision.  ENT: No tinnitus. No postnasal drip. No redness of the oropharynx.  RESPIRATORY: No cough, no wheeze, no hemoptysis. No dyspnea.  CARDIOVASCULAR: No chest pain. No orthopnea. No palpitations. No syncope.  GASTROINTESTINAL: No nausea, no vomiting or diarrhea. No abdominal pain. No melena or hematochezia.  GENITOURINARY: No dysuria or hematuria.  ENDOCRINE: No polyuria or nocturia. No heat or cold intolerance.  HEMATOLOGY: No anemia. No bruising. No bleeding.  INTEGUMENTARY: No rashes. No lesions.  MUSCULOSKELETAL: No arthritis. No swelling. No gout.  Pain in the right thigh with swelling NEUROLOGIC: No numbness, tingling, or ataxia. No seizure-type activity.  PSYCHIATRIC: No anxiety. No insomnia. No ADD.   DRUG ALLERGIES:  No Known Allergies  VITALS:  Blood pressure 115/85, pulse (!) 108, temperature 98.3 F (36.8 C), temperature source Oral, resp. rate 20, height 5\' 2"  (1.575 m), weight 70.8 kg (156 lb), SpO2 100 %.  PHYSICAL EXAMINATION:   Physical Exam  GENERAL:  84 y.o.-year-old Mercedes Rodriguez lying in the bed with no acute distress.  EYES: Pupils equal, round, reactive to light and accommodation. No scleral icterus. Extraocular muscles intact.  HEENT: Head atraumatic, normocephalic. Oropharynx and nasopharynx clear.  NECK:  Supple, no jugular venous distention. No thyroid enlargement, no tenderness.  LUNGS: Normal breath sounds bilaterally, no wheezing, rales, rhonchi. No use of accessory muscles of respiration.   CARDIOVASCULAR: S1, S2 normal. No murmurs, rubs, or gallops.  ABDOMEN: Soft, nontender, nondistended. Bowel sounds present. No organomegaly or mass.  EXTREMITIES: No cyanosis, clubbing  Tenderness right thigh, redness present  swelling right leg   NEUROLOGIC: Cranial nerves II through XII are intact. No focal Motor or sensory deficits b/l.   PSYCHIATRIC: The Mercedes Rodriguez is alert and oriented x 3.  SKIN: No obvious rash, lesion, or ulcer.   LABORATORY PANEL:   CBC Recent Labs  Lab 07/22/17 2101  WBC 9.5  HGB 9.2*  HCT 28.3*  PLT 209   ------------------------------------------------------------------------------------------------------------------ Chemistries  Recent Labs  Lab 07/22/17 2101  NA 140  K 4.3  CL 106  CO2 27  GLUCOSE 98  BUN 25*  CREATININE 1.56*  CALCIUM 8.6*   ------------------------------------------------------------------------------------------------------------------  Cardiac Enzymes No results for input(s): TROPONINI in the last 168 hours. ------------------------------------------------------------------------------------------------------------------  RADIOLOGY:  Ct Femur Right W Contrast  Result Date: 07/22/2017 CLINICAL DATA:  Right leg pain. Mercedes Rodriguez denies trauma. Swelling of the mid thigh all the way down to the foot. EXAM: CT OF THE LOWER RIGHT EXTREMITY WITH CONTRAST TECHNIQUE: Multidetector CT imaging of the lower right extremity from hip through right foot was performed according to the standard protocol following intravenous contrast administration. COMPARISON:  None. CONTRAST:  62mL OMNIPAQUE IOHEXOL 300 MG/ML  SOLN FINDINGS: Bones/Joint/Cartilage 1. Right hip: The femoral head is spherical in appearance. No flattening or evidence of fracture. Mild degenerative joint space narrowing of the right hip. 2. Right femur: No acute fracture or suspicious osseous lesions. No frank bone destruction. 3. Right knee: Intact right total knee arthroplasty  without complicating features.  No loosening or joint effusion is identified. 4. Right tibia and fibula: Negative. Tibial tray of the right knee arthroplasty appears intact. 5. Patella: Intact patellar resurfacing.  No fracture. 6. Ankle joint: Intact without joint effusion. 7. Tibia and fibula: Intact without acute osseous abnormality. 8. Subtalar and midfoot articulations: Intact. 9. Right foot and ankle: Intact Ligaments Suboptimally assessed by CT. Muscles and Tendons Mild edema of the sartorius possibly related to muscle strain or tracking of subcutaneous soft tissue edema. Along the medial aspect of the right thigh, abutting and possibly arising from the sartorius is a masslike abnormality measuring 10.9 x 5.4 x 5.7 cm in craniocaudad by transverse by AP dimension. It contains central areas of hypodensity that may represent serous fluid from a large hematoma versus a soft tissue mass/sarcoma with central necrosis. Soft tissues Circumferential soft tissue edema of the right leg, ankle and foot compatible with venous insufficiency and pitting edema versus cellulitis. Clinical correlation is therefore recommended. IMPRESSION: 1. The dominant finding is that of a soft tissue mass-like abnormality intimately associated with the mid to distal sartorius muscle measuring 10.9 x 5.4 x 5.7 cm. It contains central hypodensity that may represent central necrosis or serous fluid within. Differential possibilities may include a large soft tissue hematoma with variable degrees of liquefaction, soft tissue abscess or soft tissue mass/sarcoma are some considerations. Short-term interval follow-up for reassessment is recommended. Should findings persist or worsen, percutaneous sampling may be necessary. 2. No acute osseous abnormality. 3. Soft tissue edema of the right leg, ankle and foot either representing stigmata of venous insufficiency and soft tissue pitting edema versus cellulitis among some possibilities. Electronically  Signed   By: Ashley Royalty M.D.   On: 07/22/2017 23:32   Ct Tibia Fibula Right W Contrast  Result Date: 07/22/2017 CLINICAL DATA:  Right leg pain. Mercedes Rodriguez denies trauma. Swelling of the mid thigh all the way down to the foot. EXAM: CT OF THE LOWER RIGHT EXTREMITY WITH CONTRAST TECHNIQUE: Multidetector CT imaging of the lower right extremity from hip through right foot was performed according to the standard protocol following intravenous contrast administration. COMPARISON:  None. CONTRAST:  38mL OMNIPAQUE IOHEXOL 300 MG/ML  SOLN FINDINGS: Bones/Joint/Cartilage 1. Right hip: The femoral head is spherical in appearance. No flattening or evidence of fracture. Mild degenerative joint space narrowing of the right hip. 2. Right femur: No acute fracture or suspicious osseous lesions. No frank bone destruction. 3. Right knee: Intact right total knee arthroplasty without complicating features. No loosening or joint effusion is identified. 4. Right tibia and fibula: Negative. Tibial tray of the right knee arthroplasty appears intact. 5. Patella: Intact patellar resurfacing.  No fracture. 6. Ankle joint: Intact without joint effusion. 7. Tibia and fibula: Intact without acute osseous abnormality. 8. Subtalar and midfoot articulations: Intact. 9. Right foot and ankle: Intact Ligaments Suboptimally assessed by CT. Muscles and Tendons Mild edema of the sartorius possibly related to muscle strain or tracking of subcutaneous soft tissue edema. Along the medial aspect of the right thigh, abutting and possibly arising from the sartorius is a masslike abnormality measuring 10.9 x 5.4 x 5.7 cm in craniocaudad by transverse by AP dimension. It contains central areas of hypodensity that may represent serous fluid from a large hematoma versus a soft tissue mass/sarcoma with central necrosis. Soft tissues Circumferential soft tissue edema of the right leg, ankle and foot compatible with venous insufficiency and pitting edema versus  cellulitis. Clinical correlation is therefore recommended. IMPRESSION: 1. The dominant finding is  that of a soft tissue mass-like abnormality intimately associated with the mid to distal sartorius muscle measuring 10.9 x 5.4 x 5.7 cm. It contains central hypodensity that may represent central necrosis or serous fluid within. Differential possibilities may include a large soft tissue hematoma with variable degrees of liquefaction, soft tissue abscess or soft tissue mass/sarcoma are some considerations. Short-term interval follow-up for reassessment is recommended. Should findings persist or worsen, percutaneous sampling may be necessary. 2. No acute osseous abnormality. 3. Soft tissue edema of the right leg, ankle and foot either representing stigmata of venous insufficiency and soft tissue pitting edema versus cellulitis among some possibilities. Electronically Signed   By: Ashley Royalty M.D.   On: 07/22/2017 23:32   US Venous Img Lower Unilateral Right  Result Date: 07/22/2017 CLINICAL DATA:  Medial upper thigh pain and redness x2 weeks EXAM: RIGHT LOWER EXTREMITY VENOUS DOPPLER ULTRASOUND TECHNIQUE: Gray-scale sonography with compression, as well as color and duplex ultrasound, were performed to evaluate the deep venous system from the level of the common femoral vein through the popliteal and proximal calf veins. COMPARISON:  02/15/2001 by report only FINDINGS: Normal compressibility of the common femoral, superficial femoral, and popliteal veins, as well as the proximal calf veins. No filling defects to suggest DVT on grayscale or color Doppler imaging. Doppler waveforms show normal direction of venous flow, normal respiratory phasicity and response to augmentation. Subcutaneous edema in the calf. Right inguinal lymph nodes up to 1.2 cm short axis diameter. There is a poorly marginated very hypoechoic region in the deep subcutaneous tissues of the medial right thigh measuring 9.2 x 3.9 x 5.7 cm showing  hyperemia on color Doppler. Survey views of the contralateral common femoral vein are unremarkable. IMPRESSION: 1.  No evidence of right lower extremity deep vein thrombosis. 2. 9.2 cm right medial thigh lesion more concerning for neoplasm than abscess, corresponding to region of concern. Consider MR with contrast for further characterization. Electronically Signed   By: Lucrezia Europe M.D.   On: 07/22/2017 19:56     ASSESSMENT AND PLAN:  84 year old female Mercedes Rodriguez with chronic kidney disease stage III, atrial fibrillation, hypertension, congestive heart failure currently under hospitalist service for right thigh cellulitis  -Right thigh cellulitis Continue broad-spectrum IV vancomycin and Zosyn antibiotics Follow-up cultures  -Right thigh mass/hematoma Work-up for malignancy for possible sarcoma Surgery consult CT imaging studies reviewed  -DVT prophylaxis Continue Xarelto for anticoagulation  -Chronic congestive heart failure Continue Lasix for diuresis   All the records are reviewed and case discussed with Care Management/Social Worker. Management plans discussed with the Mercedes Rodriguez, family and they are in agreement.  CODE STATUS: DNR  DVT Prophylaxis: SCDs  TOTAL TIME TAKING CARE OF THIS Mercedes Rodriguez: 35 minutes.   POSSIBLE D/C IN 1 to 2 DAYS, DEPENDING ON CLINICAL CONDITION.  Saundra Shelling M.D on 07/23/2017 at 11:04 AM  Between 7am to 6pm - Pager - [phone]  After 6pm go to [email or web address] - password [email or web address] [facility]  Office  [phone]  CC: Primary care physician; Lynnell Jude, MD  Note: This dictation was prepared with Dragon dictation along with smaller phrase technology. Any transcriptional errors that result from this process are unintentional.

## 2017-07-23 NOTE — Progress Notes (Signed)
Pharmacy Antibiotic Note  Mercedes Rodriguez is a 84 y.o. female admitted on 07/22/2017 with thigh abscess/mass.  Pharmacy has been consulted for vanc/zosyn dosing. Patient received vanc 1g and zosyn 3.375g IV x 1.  Plan: Will continue vanc 750 mg IV q24h w/ 8 hour stack  Will draw vanc trough 07/21 @ 0800 prior to 4th dose Will continue zosyn 3.375g IV q8h  Ke 0.0246 T1/2 30 ~ 24 hrs Goal trough 15 - 20 mcg/mL  Height: 5\' 2"  (157.5 cm) Weight: 156 lb (70.8 kg) IBW/kg (Calculated) : 50.1  Temp (24hrs), Avg:98.3 F (36.8 C), Min:98 F (36.7 C), Max:98.6 F (37 C)  Recent Labs  Lab 07/22/17 2101 07/23/17 0017  WBC 9.5  --   CREATININE 1.56*  --   LATICACIDVEN  --  1.6    Estimated Creatinine Clearance: 24.3 mL/min (A) (by C-G formula based on SCr of 1.56 mg/dL (H)).    No Known Allergies  Thank you for allowing pharmacy to be a part of this patient's care.  Tobie Lords, PharmD, BCPS Clinical Pharmacist 07/23/2017

## 2017-07-24 ENCOUNTER — Inpatient Hospital Stay: Payer: Medicare HMO

## 2017-07-24 LAB — BASIC METABOLIC PANEL
Anion gap: 3 — ABNORMAL LOW (ref 5–15)
BUN: 20 mg/dL (ref 8–23)
CO2: 29 mmol/L (ref 22–32)
CREATININE: 1.5 mg/dL — AB (ref 0.44–1.00)
Calcium: 8.3 mg/dL — ABNORMAL LOW (ref 8.9–10.3)
Chloride: 108 mmol/L (ref 98–111)
GFR, EST AFRICAN AMERICAN: 35 mL/min — AB (ref >60)
GFR, EST NON AFRICAN AMERICAN: 31 mL/min — AB (ref >60)
GLUCOSE: 99 mg/dL (ref 70–99)
Potassium: 4.1 mmol/L (ref 3.5–5.1)
Sodium: 140 mmol/L (ref 135–145)

## 2017-07-24 MED ORDER — GADOBENATE DIMEGLUMINE 529 MG/ML IV SOLN
10.0000 mL | Freq: Once | INTRAVENOUS | Status: AC | PRN
  Administered 2017-07-24: 7 mL via INTRAVENOUS

## 2017-07-24 NOTE — Progress Notes (Signed)
Subjective: No new complaints.  The patient denies any change in her symptoms as they pertain to her right lower extremity.  She denies any fevers or significant pain in the leg.   Objective: Vital signs in last 24 hours: Temp:  [98.4 F (36.9 C)-99.3 F (37.4 C)] 98.4 F (36.9 C) (07/19 1542) Pulse Rate:  [99-106] 106 (07/19 1542) Resp:  [16-18] 16 (07/19 1542) BP: (109-120)/(56-68) 115/63 (07/19 1542) SpO2:  [97 %-100 %] 97 % (07/19 1542)  Intake/Output from previous day: 07/18 0701 - 07/19 0700 In: 1128.1 [P.O.:720; IV Piggyback:408.1] Out: -  Intake/Output this shift: Total I/O In: 360 [P.O.:360] Out: -   Recent Labs    07/22/17 2101  HGB 9.2*   Recent Labs    07/22/17 2101  WBC 9.5  RBC 3.39*  HCT 28.3*  PLT 209   Recent Labs    07/22/17 2101 07/24/17 0337  NA 140 140  K 4.3 4.1  CL 106 108  CO2 27 29  BUN 25* 20  CREATININE 1.56* 1.50*  GLUCOSE 98 99  CALCIUM 8.6* 8.3*   No results for input(s): LABPT, INR in the last 72 hours.  Physical Exam: Orthopedic examination again is limited to the right lower extremity.  The large firm mildly tender mass in the medial aspect of the distal thigh appears to be unchanged as compared to yesterday.  There is mild warmth in the area, but no obvious erythema.  Localized resolving ecchymosis is noted over and just posterior to the mass.  She is able to actively flex and extend her knee from 0 to 110 degrees without any pain.  There is no ligamentous laxity.  Her patella tracks well and is without crepitance. She remains neurovascularly intact to the right lower extremity and foot.   X-rays: MRI pending.  Assessment: Right thigh mass of unclear etiology.  Plan: I agree with the proposed plan to proceed with obtaining an MRI scan with and without contrast of the right thigh mass to better elucidate its etiology.  If the mass is most consistent with a large hematoma, then this can be managed nonsurgically with  observation and warm compresses.  If the mass is most consistent with a sarcoma, then arrangements should be made for the patient to be evaluated by the orthopedic oncology clinic at [facility] in the near future.  This could be done as an outpatient so long as the appointment is made within 1 to 2 weeks or so.  I also agree with discontinuing the IV antibiotics at this time as the patient has not shown any nickel evidence for an associated infection.  The patient may be mobilized with physical therapy, weightbearing as tolerated on the right lower extremity.   Marshall Cork Poggi 07/24/2017, 4:37 PM

## 2017-07-24 NOTE — Progress Notes (Addendum)
[place] [facility] [place] NAME: Ashlynn Gunnels    MR#:  [id number]  DATE OF BIRTH:  12-May-1929  SUBJECTIVE:  CHIEF COMPLAINT:   Chief Complaint  Patient presents with  . Leg Pain  Patient seen and evaluated today Has pain in the right thigh No fever and chills  REVIEW OF SYSTEMS:    ROS  CONSTITUTIONAL: No documented fever. No fatigue, weakness. No weight gain, no weight loss.  EYES: No blurry or double vision.  ENT: No tinnitus. No postnasal drip. No redness of the oropharynx.  RESPIRATORY: No cough, no wheeze, no hemoptysis. No dyspnea.  CARDIOVASCULAR: No chest pain. No orthopnea. No palpitations. No syncope.  GASTROINTESTINAL: No nausea, no vomiting or diarrhea. No abdominal pain. No melena or hematochezia.  GENITOURINARY: No dysuria or hematuria.  ENDOCRINE: No polyuria or nocturia. No heat or cold intolerance.  HEMATOLOGY: No anemia. No bruising. No bleeding.  INTEGUMENTARY: No rashes. No lesions.  MUSCULOSKELETAL: No arthritis. No swelling. No gout.  Pain in the right thigh with swelling NEUROLOGIC: No numbness, tingling, or ataxia. No seizure-type activity.  PSYCHIATRIC: No anxiety. No insomnia. No ADD.   DRUG ALLERGIES:  No Known Allergies  VITALS:  Blood pressure (!) 109/56, pulse (!) 105, temperature 99.3 F (37.4 C), temperature source Oral, resp. rate 18, height 5\' 2"  (1.575 m), weight 70.8 kg (156 lb), SpO2 100 %.  PHYSICAL EXAMINATION:   Physical Exam  GENERAL:  84 y.o.-year-old patient lying in the bed with no acute distress.  EYES: Pupils equal, round, reactive to light and accommodation. No scleral icterus. Extraocular muscles intact.  HEENT: Head atraumatic, normocephalic. Oropharynx and nasopharynx clear.  NECK:  Supple, no jugular venous distention. No thyroid enlargement, no tenderness.  LUNGS: Normal breath sounds bilaterally, no wheezing, rales, rhonchi. No use of accessory muscles of respiration.   CARDIOVASCULAR: S1, S2 normal. No murmurs, rubs, or gallops.  ABDOMEN: Soft, nontender, nondistended. Bowel sounds present. No organomegaly or mass.  EXTREMITIES: No cyanosis, clubbing  Right thigh mass noted NEUROLOGIC: Cranial nerves II through XII are intact. No focal Motor or sensory deficits b/l.   PSYCHIATRIC: The patient is alert and oriented x 3.  SKIN: No obvious rash, lesion, or ulcer.   LABORATORY PANEL:   CBC Recent Labs  Lab 07/22/17 2101  WBC 9.5  HGB 9.2*  HCT 28.3*  PLT 209   ------------------------------------------------------------------------------------------------------------------ Chemistries  Recent Labs  Lab 07/24/17 0337  NA 140  K 4.1  CL 108  CO2 29  GLUCOSE 99  BUN 20  CREATININE 1.50*  CALCIUM 8.3*   ------------------------------------------------------------------------------------------------------------------  Cardiac Enzymes No results for input(s): TROPONINI in the last 168 hours. ------------------------------------------------------------------------------------------------------------------  RADIOLOGY:  Ct Femur Right W Contrast  Result Date: 07/22/2017 CLINICAL DATA:  Right leg pain. Patient denies trauma. Swelling of the mid thigh all the way down to the foot. EXAM: CT OF THE LOWER RIGHT EXTREMITY WITH CONTRAST TECHNIQUE: Multidetector CT imaging of the lower right extremity from hip through right foot was performed according to the standard protocol following intravenous contrast administration. COMPARISON:  None. CONTRAST:  28mL OMNIPAQUE IOHEXOL 300 MG/ML  SOLN FINDINGS: Bones/Joint/Cartilage 1. Right hip: The femoral head is spherical in appearance. No flattening or evidence of fracture. Mild degenerative joint space narrowing of the right hip. 2. Right femur: No acute fracture or suspicious osseous lesions. No frank bone destruction. 3. Right knee: Intact right total knee arthroplasty without complicating features. No  loosening or joint effusion is  identified. 4. Right tibia and fibula: Negative. Tibial tray of the right knee arthroplasty appears intact. 5. Patella: Intact patellar resurfacing.  No fracture. 6. Ankle joint: Intact without joint effusion. 7. Tibia and fibula: Intact without acute osseous abnormality. 8. Subtalar and midfoot articulations: Intact. 9. Right foot and ankle: Intact Ligaments Suboptimally assessed by CT. Muscles and Tendons Mild edema of the sartorius possibly related to muscle strain or tracking of subcutaneous soft tissue edema. Along the medial aspect of the right thigh, abutting and possibly arising from the sartorius is a masslike abnormality measuring 10.9 x 5.4 x 5.7 cm in craniocaudad by transverse by AP dimension. It contains central areas of hypodensity that may represent serous fluid from a large hematoma versus a soft tissue mass/sarcoma with central necrosis. Soft tissues Circumferential soft tissue edema of the right leg, ankle and foot compatible with venous insufficiency and pitting edema versus cellulitis. Clinical correlation is therefore recommended. IMPRESSION: 1. The dominant finding is that of a soft tissue mass-like abnormality intimately associated with the mid to distal sartorius muscle measuring 10.9 x 5.4 x 5.7 cm. It contains central hypodensity that may represent central necrosis or serous fluid within. Differential possibilities may include a large soft tissue hematoma with variable degrees of liquefaction, soft tissue abscess or soft tissue mass/sarcoma are some considerations. Short-term interval follow-up for reassessment is recommended. Should findings persist or worsen, percutaneous sampling may be necessary. 2. No acute osseous abnormality. 3. Soft tissue edema of the right leg, ankle and foot either representing stigmata of venous insufficiency and soft tissue pitting edema versus cellulitis among some possibilities. Electronically Signed   By: Ashley Royalty M.D.    On: 07/22/2017 23:32   Ct Tibia Fibula Right W Contrast  Result Date: 07/22/2017 CLINICAL DATA:  Right leg pain. Patient denies trauma. Swelling of the mid thigh all the way down to the foot. EXAM: CT OF THE LOWER RIGHT EXTREMITY WITH CONTRAST TECHNIQUE: Multidetector CT imaging of the lower right extremity from hip through right foot was performed according to the standard protocol following intravenous contrast administration. COMPARISON:  None. CONTRAST:  41mL OMNIPAQUE IOHEXOL 300 MG/ML  SOLN FINDINGS: Bones/Joint/Cartilage 1. Right hip: The femoral head is spherical in appearance. No flattening or evidence of fracture. Mild degenerative joint space narrowing of the right hip. 2. Right femur: No acute fracture or suspicious osseous lesions. No frank bone destruction. 3. Right knee: Intact right total knee arthroplasty without complicating features. No loosening or joint effusion is identified. 4. Right tibia and fibula: Negative. Tibial tray of the right knee arthroplasty appears intact. 5. Patella: Intact patellar resurfacing.  No fracture. 6. Ankle joint: Intact without joint effusion. 7. Tibia and fibula: Intact without acute osseous abnormality. 8. Subtalar and midfoot articulations: Intact. 9. Right foot and ankle: Intact Ligaments Suboptimally assessed by CT. Muscles and Tendons Mild edema of the sartorius possibly related to muscle strain or tracking of subcutaneous soft tissue edema. Along the medial aspect of the right thigh, abutting and possibly arising from the sartorius is a masslike abnormality measuring 10.9 x 5.4 x 5.7 cm in craniocaudad by transverse by AP dimension. It contains central areas of hypodensity that may represent serous fluid from a large hematoma versus a soft tissue mass/sarcoma with central necrosis. Soft tissues Circumferential soft tissue edema of the right leg, ankle and foot compatible with venous insufficiency and pitting edema versus cellulitis. Clinical correlation is  therefore recommended. IMPRESSION: 1. The dominant finding is that of a soft tissue mass-like  abnormality intimately associated with the mid to distal sartorius muscle measuring 10.9 x 5.4 x 5.7 cm. It contains central hypodensity that may represent central necrosis or serous fluid within. Differential possibilities may include a large soft tissue hematoma with variable degrees of liquefaction, soft tissue abscess or soft tissue mass/sarcoma are some considerations. Short-term interval follow-up for reassessment is recommended. Should findings persist or worsen, percutaneous sampling may be necessary. 2. No acute osseous abnormality. 3. Soft tissue edema of the right leg, ankle and foot either representing stigmata of venous insufficiency and soft tissue pitting edema versus cellulitis among some possibilities. Electronically Signed   By: Ashley Royalty M.D.   On: 07/22/2017 23:32   US Venous Img Lower Unilateral Right  Result Date: 07/22/2017 CLINICAL DATA:  Medial upper thigh pain and redness x2 weeks EXAM: RIGHT LOWER EXTREMITY VENOUS DOPPLER ULTRASOUND TECHNIQUE: Gray-scale sonography with compression, as well as color and duplex ultrasound, were performed to evaluate the deep venous system from the level of the common femoral vein through the popliteal and proximal calf veins. COMPARISON:  02/15/2001 by report only FINDINGS: Normal compressibility of the common femoral, superficial femoral, and popliteal veins, as well as the proximal calf veins. No filling defects to suggest DVT on grayscale or color Doppler imaging. Doppler waveforms show normal direction of venous flow, normal respiratory phasicity and response to augmentation. Subcutaneous edema in the calf. Right inguinal lymph nodes up to 1.2 cm short axis diameter. There is a poorly marginated very hypoechoic region in the deep subcutaneous tissues of the medial right thigh measuring 9.2 x 3.9 x 5.7 cm showing hyperemia on color Doppler. Survey views of  the contralateral common femoral vein are unremarkable. IMPRESSION: 1.  No evidence of right lower extremity deep vein thrombosis. 2. 9.2 cm right medial thigh lesion more concerning for neoplasm than abscess, corresponding to region of concern. Consider MR with contrast for further characterization. Electronically Signed   By: Lucrezia Europe M.D.   On: 07/22/2017 19:56     ASSESSMENT AND PLAN:  84 year old female patient with chronic kidney disease stage III, atrial fibrillation, hypertension, congestive heart failure currently under hospitalist service for right thigh cellulitis  -Right thigh mass Rule out sarcoma versus hematoma S/p orthopedic and oncology evaluation MRI thigh today to assess for sarcoma Discontinue IV antibiotics  -DVT prophylaxis Continue Xarelto for anticoagulation  -Chronic congestive heart failure Continue Lasix for diuresis  -Right thigh cellulitis unlikely DC antibiotics  All the records are reviewed and case discussed with Care Management/Social Worker. Management plans discussed with the patient, family and they are in agreement.  CODE STATUS: DNR  DVT Prophylaxis: SCDs  TOTAL TIME TAKING CARE OF THIS PATIENT: 34 minutes.   POSSIBLE D/C IN 1 to 2 DAYS, DEPENDING ON CLINICAL CONDITION.  Saundra Shelling M.D on 07/24/2017 at 10:58 AM  Between 7am to 6pm - Pager - [phone]  After 6pm go to [email or web address] - password [email or web address] [facility]  Office  [phone]  CC: Primary care physician; Lynnell Jude, MD  Note: This dictation was prepared with Dragon dictation along with smaller phrase technology. Any transcriptional errors that result from this process are unintentional.

## 2017-07-25 LAB — BASIC METABOLIC PANEL
Anion gap: 7 (ref 5–15)
BUN: 19 mg/dL (ref 8–23)
CHLORIDE: 106 mmol/L (ref 98–111)
CO2: 26 mmol/L (ref 22–32)
Calcium: 8.2 mg/dL — ABNORMAL LOW (ref 8.9–10.3)
Creatinine, Ser: 1.37 mg/dL — ABNORMAL HIGH (ref 0.44–1.00)
GFR calc non Af Amer: 34 mL/min — ABNORMAL LOW (ref >60)
GFR, EST AFRICAN AMERICAN: 40 mL/min — AB (ref >60)
Glucose, Bld: 85 mg/dL (ref 70–99)
POTASSIUM: 4 mmol/L (ref 3.5–5.1)
SODIUM: 139 mmol/L (ref 135–145)

## 2017-07-25 LAB — CBC
HEMATOCRIT: 25.5 % — AB (ref 35.0–47.0)
HEMOGLOBIN: 8.3 g/dL — AB (ref 12.0–16.0)
MCH: 27 pg (ref 26.0–34.0)
MCHC: 32.6 g/dL (ref 32.0–36.0)
MCV: 83 fL (ref 80.0–100.0)
Platelets: 187 10*3/uL (ref 150–440)
RBC: 3.08 MIL/uL — AB (ref 3.80–5.20)
RDW: 17.3 % — ABNORMAL HIGH (ref 11.5–14.5)
WBC: 7.8 10*3/uL (ref 3.6–11.0)

## 2017-07-25 MED ORDER — FUROSEMIDE 40 MG PO TABS: 20.0000 mg | ORAL_TABLET | Freq: Every day | ORAL | Status: AC

## 2017-07-25 NOTE — Discharge Summary (Signed)
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 Arrhythmia   . Atrial fibrillation ([place])   . CHF (congestive heart failure) ([place])   . Hearing loss   . Hypertension      ADMITTING HISTORY Mercedes Rodriguez  is a 84 y.o. female with a known history of Afib (Xarelto), recent Dx gastric Ca p/w RLE erythema/induration. Pt hard of hearing. States she uses 2L O2 [place] qHS/PRN at baseline. Endorses 1.5wk Hx R inner thigh erythema/induration, which she states has progressively increased in size since onset. She states she saw a physician in the interrim, and was prescribed outpt PO ABx (Augmentin I believe), but she states the aforementioned symptoms have gotten worse despite taking the prescribed ABx.  She denies pain/tenderness at the site, but states it has intermittently been tender/painful. Denies ambulatory dysfxn. Denies purulent/serosanguinous discharge from the site. Denies fever/chills, diaphoresis, night sweats or rigors. She states she is otherwise feeling well. She states she came to the ED at the behest of her granddaughter, who was justifiably alarmed at persistence/worsening of symptoms despite outpt therapy. SIRS (-). Lactate WNL (1.6). Contrast CT of RLE demonstrates, "a soft tissue mass-like abnormality intimately associated with the mid to distal sartorius muscle  measuring 10.9 x 5.4 x 5.7 cm. It contains central hypodensity that may represent central necrosis or serous fluid within. Differential possibilities may include a large soft tissue hematoma with variable degrees of liquefaction, soft tissue abscess or soft tissue mass/sarcoma are some considerations." Started on Vancomycin/Zosyn in ED. Pt comfortable, in no distress, denies pain, does not appear septic/toxic.  04/06/2017 Echo demonstrated, "NORMAL LEFT VENTRICULAR SYSTOLIC FUNCTION WITH MILD LVH, NORMAL RIGHT VENTRICULAR SYSTOLIC FUNCTION, MILD VALVULAR REGURGITATION, NO VALVULAR STENOSIS, MILD to MODERATE AR + TR, MILD MR + PR, MODERATE PHTN, EF 55%".  HOSPITAL COURSE:  Patient was admitted to medical floor. Was initially put on broad spectrum antibioitcs for presumed cellulitis of thigh.  Patient received Vanco and Zosyn antibiotics.  Cultures did not reveal any growth.  CT scan of the right lower extremity showed heterogeneous mass in the right thigh area measuring 10 x 5 x 5 cm.  Oncology and orthopedic surgery consultation was done.  Patient was worked up with MRI thigh which revealed heterogeneous mass in the distal right thigh measuring 10 x 6 x 5 cm.  Patient will be discharged home with outpatient referral to [facility] which will be arranged by our oncology team.  Orthopedic surgery attending also recommended further evaluation at tertiary center for the right thigh mass.  Cellulitis was ruled out and antibiotics were stopped.  No evidence of any abscess according to the MRI thigh and no evidence of any hematoma in the thigh as per imaging studies.  Patient continued anticoagulation with oral Xarelto for chronic atrial fibrillation.  Patient hemodynamically stable will be discharged home.  CONSULTS OBTAINED:  Treatment Team:  Arta Silence, MD Earlie Server, MD Poggi, Marshall Cork,  MD  DRUG ALLERGIES:  No Known Allergies  DISCHARGE MEDICATIONS:   Allergies as of 07/25/2017   No  Known Allergies     Medication List    TAKE these medications   acetaminophen 325 MG tablet Commonly known as:  TYLENOL Take 2 tablets (650 mg total) by mouth every 6 (six) hours as needed for mild pain (or Fever >/= 101).   furosemide 40 MG tablet Commonly known as:  LASIX Take 0.5 tablets (20 mg total) by mouth daily.   metoprolol succinate 100 MG 24 hr tablet Commonly known as:  TOPROL-XL Take 100 mg by mouth daily. Take with or immediately following a meal.   potassium chloride SA 20 MEQ tablet Commonly known as:  K-DUR,KLOR-CON Take 1 tablet (20 mEq total) by mouth daily.   ranitidine 150 MG tablet Commonly known as:  ZANTAC Take 150 mg by mouth 2 (two) times daily as needed for heartburn.   Rivaroxaban 15 MG Tabs tablet Commonly known as:  XARELTO Take 1 tablet (15 mg total) by mouth daily with supper.   traZODone 100 MG tablet Commonly known as:  DESYREL Take 1 tablet (100 mg total) by mouth at bedtime as needed for sleep. What changed:  when to take this   triamcinolone cream 0.1 % Commonly known as:  KENALOG Apply 1 application topically 2 (two) times daily.       Today  Patient seen and evaluated on the day of discharge Tolerating diet well Decreased pain in the right lower extremity No fever and chills  VITAL SIGNS:  Blood pressure 114/67, pulse 97, temperature 98.2 F (36.8 C), temperature source Oral, resp. rate (!) 22, height 5\' 2"  (1.575 m), weight 70.8 kg (156 lb), SpO2 95 %.  I/O:    Intake/Output Summary (Last 24 hours) at 07/25/2017 1034 Last data filed at 07/24/2017 1756 Gross per 24 hour  Intake 360 ml  Output -  Net 360 ml    PHYSICAL EXAMINATION:  Physical Exam  GENERAL:  84 y.o.-year-old patient lying in the bed with no acute distress.  LUNGS: Normal breath sounds bilaterally, no wheezing, rales,rhonchi or crepitation. No use of accessory muscles of respiration.  CARDIOVASCULAR: S1, S2 normal. No murmurs, rubs, or gallops.   ABDOMEN: Soft, non-tender, non-distended. Bowel sounds present. No organomegaly or mass.  NEUROLOGIC: Moves all 4 extremities. PSYCHIATRIC: The patient is alert and oriented x 3.  SKIN: No obvious rash, lesion, or ulcer.  Has mass in the right thigh  DATA REVIEW:   CBC Recent Labs  Lab 07/25/17 0613  WBC 7.8  HGB 8.3*  HCT 25.5*  PLT 187    Chemistries  Recent Labs  Lab 07/25/17 0613  NA 139  K 4.0  CL 106  CO2 26  GLUCOSE 85  BUN 19  CREATININE 1.37*  CALCIUM 8.2*    Cardiac Enzymes No results for input(s): TROPONINI in the last 168 hours.  Microbiology Results  Results for orders placed or performed during the hospital encounter of 07/22/17  Culture, blood (routine x 2)     Status: None (Preliminary result)   Collection Time: 07/23/17 12:11 AM  Result Value Ref Range Status   Specimen Description BLOOD RIGHT [facility]  Final   Special Requests   Final    BOTTLES DRAWN AEROBIC AND ANAEROBIC Blood Culture results may not be optimal due to an excessive volume of blood received in culture bottles   Culture   Final    NO GROWTH 2 DAYS Performed at  [facility], [address], [place], [place] [address]    Report Status PENDING  Incomplete  Culture, blood (routine x 2)     Status: None (Preliminary result)   Collection Time: 07/23/17  2:41 AM  Result Value Ref Range Status   Specimen Description BLOOD RIGHT ANTECUBITAL  Final   Special Requests   Final    BOTTLES DRAWN AEROBIC AND ANAEROBIC Blood Culture adequate volume   Culture   Final    NO GROWTH 2 DAYS Performed at [facility], [address] [place], [place] [address]    Report Status PENDING  Incomplete    RADIOLOGY:  Mr Femur Right W Wo Contrast  Result Date: 07/24/2017 CLINICAL DATA:  Medial right thigh mass EXAM: MRI OF THE RIGHT FEMUR WITHOUT AND WITH CONTRAST TECHNIQUE: Multiplanar, multisequence MR imaging of the right thigh was performed both before and after administration  of intravenous contrast. CONTRAST:  25mL MULTIHANCE GADOBENATE DIMEGLUMINE 529 MG/ML IV SOLN COMPARISON:  07/22/2017 CT FINDINGS: Bones/Joint/Cartilage Bilateral knee arthroplasties are noted limiting assessment of the femoral condyles, patella and tibial plateaus. No suspicious marrow signal abnormality, periostitis or bone destruction is identified. Ligaments Noncontributory Muscles and Tendons See below Soft tissues A heterogeneous, mixed attenuating soft tissue mass is noted along the medial aspect of the distal right thigh measuring approximately 10 x 6.7 x 5.2 cm. It appears to be intimately associated with the right sartorius muscle suggesting local invasion of the muscle. On T1 weighted imaging and is intermediate in signal intensity and slightly higher in signal intensity relative to muscle. On T2 weighted images, it is predominantly hyperintense relative to muscle and upon contrast administration, it demonstrates enhancement predominantly along the periphery with central area of nonenhancing hypointensity that may represent areas of necrosis or fibrosis. There are thin hypointense bands noted within this mass that may represent linear bands of collagen. There is generalized interdigitating soft tissue edema of the right thigh causing mild asymmetry in size of the thighs, right greater than left. No abnormal drainable fluid collection is seen. IMPRESSION: Heterogeneously enhancing soft tissue mass locally involving the adjacent right sartorius muscle within the mid to distal thigh measuring 10 x 6.7 x 5.7 cm. Central areas of nonenhancing hypointensity are noted within this mass that may represent areas of necrosis and/or fibrosis. Differential considerations would include an extra abdominal aggressive fibromatosis/desmoid though a would be difficult to exclude sarcoma from this differential. Percutaneous sampling of this mass may be necessary for better assessment. Given enhancement, hematoma is unlikely.  Abscess is also believed unlikely given the relative enhancement seen. Bilateral knee arthroplasties. No acute osseous involvement of the right femur by this lesion. Electronically Signed   By: Ashley Royalty M.D.   On: 07/24/2017 23:20    Follow up with PCP in 1 week.  Management plans discussed with the patient, family and they are in agreement.  CODE STATUS: DNR    Code Status Orders  (From admission, onward)        Start     Ordered   07/23/17 0159  Do not attempt resuscitation (DNR)  Continuous    Question Answer Comment  In the event of cardiac or respiratory ARREST Do not call a "code blue"   In the event of cardiac or respiratory ARREST Do not perform Intubation, CPR, defibrillation or ACLS   In the event of cardiac or respiratory ARREST Use medication by any route, position, wound care, and other measures to relive pain and suffering.  May use oxygen, suction and manual treatment of airway obstruction as needed for comfort.   Comments RN may pronounce      07/23/17 0158    Code Status History    Date Active Date Inactive Code Status Order ID Comments User Context   04/09/2017 1102 04/13/2017 1840 DNR [id number]  Gorden Harms, MD Inpatient   02/20/2017 1625 02/23/2017 1449 DNR [id number]  [name], MD Inpatient   12/31/2016 1206 01/08/2017 1750 DNR [id number]  Bettey Costa, MD Inpatient      TOTAL TIME TAKING CARE OF THIS PATIENT ON DAY OF DISCHARGE: more than 34 minutes.   Saundra Shelling M.D on 07/25/2017 at 10:34 AM  Between 7am to 6pm - Pager - [phone]  After 6pm go to [email or web address] - password [email or web address] [facility]  Office  [phone]  CC: Primary care physician; Lynnell Jude, MD  Note: This dictation was prepared with Dragon dictation along with smaller phrase technology. Any transcriptional errors that result from this process are unintentional.

## 2017-07-25 NOTE — Progress Notes (Addendum)
Pt is being discharged today, IV x2 were removed and discharge instructions were reviewed with the patient. She verified understanding. All belongings were packed and returned to her. She is currently waiting on her ride.

## 2017-07-28 LAB — CULTURE, BLOOD (ROUTINE X 2)
CULTURE: NO GROWTH
Culture: NO GROWTH
SPECIAL REQUESTS: ADEQUATE

## 2017-08-03 ENCOUNTER — Inpatient Hospital Stay: Payer: Medicare HMO

## 2017-08-03 ENCOUNTER — Inpatient Hospital Stay: Payer: Medicare HMO | Attending: Oncology | Admitting: Oncology

## 2017-08-03 ENCOUNTER — Encounter: Payer: Self-pay | Admitting: Oncology

## 2017-08-03 ENCOUNTER — Other Ambulatory Visit: Payer: Self-pay

## 2017-08-03 VITALS — BP 118/80 | HR 127 | Temp 99.8°F | Resp 20 | Ht 62.0 in | Wt 159.6 lb

## 2017-08-03 DIAGNOSIS — D649 Anemia, unspecified: Secondary | ICD-10-CM

## 2017-08-03 DIAGNOSIS — I10 Essential (primary) hypertension: Secondary | ICD-10-CM | POA: Diagnosis not present

## 2017-08-03 DIAGNOSIS — Z87891 Personal history of nicotine dependence: Secondary | ICD-10-CM | POA: Insufficient documentation

## 2017-08-03 DIAGNOSIS — I4891 Unspecified atrial fibrillation: Secondary | ICD-10-CM | POA: Insufficient documentation

## 2017-08-03 DIAGNOSIS — R2241 Localized swelling, mass and lump, right lower limb: Secondary | ICD-10-CM | POA: Diagnosis not present

## 2017-08-03 DIAGNOSIS — Z7901 Long term (current) use of anticoagulants: Secondary | ICD-10-CM

## 2017-08-03 DIAGNOSIS — D099 Carcinoma in situ, unspecified: Secondary | ICD-10-CM

## 2017-08-03 DIAGNOSIS — I509 Heart failure, unspecified: Secondary | ICD-10-CM | POA: Insufficient documentation

## 2017-08-03 LAB — BASIC METABOLIC PANEL
Anion gap: 12 (ref 5–15)
BUN: 23 mg/dL (ref 8–23)
CO2: 24 mmol/L (ref 22–32)
Calcium: 8.4 mg/dL — ABNORMAL LOW (ref 8.9–10.3)
Chloride: 101 mmol/L (ref 98–111)
Creatinine, Ser: 1.7 mg/dL — ABNORMAL HIGH (ref 0.44–1.00)
GFR calc Af Amer: 30 mL/min — ABNORMAL LOW (ref >60)
GFR calc non Af Amer: 26 mL/min — ABNORMAL LOW (ref >60)
Glucose, Bld: 119 mg/dL — ABNORMAL HIGH (ref 70–99)
Potassium: 4.2 mmol/L (ref 3.5–5.1)
Sodium: 137 mmol/L (ref 135–145)

## 2017-08-03 LAB — IRON AND TIBC
Iron: 23 ug/dL — ABNORMAL LOW (ref 28–170)
Saturation Ratios: 6 % — ABNORMAL LOW (ref 10.4–31.8)
TIBC: 393 ug/dL (ref 250–450)
UIBC: 370 ug/dL

## 2017-08-03 LAB — VITAMIN B12: Vitamin B-12: 441 pg/mL (ref 180–914)

## 2017-08-03 LAB — RETICULOCYTES
RBC.: 3.31 MIL/uL — ABNORMAL LOW (ref 3.80–5.20)
Retic Count, Absolute: 66.2 10*3/uL (ref 19.0–183.0)
Retic Ct Pct: 2 % (ref 0.4–3.1)

## 2017-08-03 LAB — FOLATE: FOLATE: 13.8 ng/mL (ref >5.9)

## 2017-08-03 LAB — FERRITIN: Ferritin: 58 ng/mL (ref 11–307)

## 2017-08-03 NOTE — Progress Notes (Signed)
Hematology/Oncology Follow Up Note [facility]  [phone] [phone]  Patient Care Team: Lynnell Jude, MD as PCP - General (Family Medicine)   Name of the patient: Mercedes Rodriguez  [id number]  1929/01/18   REASON FOR VISIT Follow up for right thigh mass.   INTERVAL HISTORY 84 year old female present for management of right thigh mass. She has a history of A. fib on chronic anticoagulation with Xarelto, recent diagnosis of gastric cancer, chronic respiratory failure using home oxygen as needed at baseline presented emergency room complaining about right inner thigh erythema/induration.  Prior than that, she was treated with a course of oral antibiotics but symptoms got worse.  Patient is hard of hearing and poor historian. 07/22/2017 CT right femur showed soft tissue masslike abnormality such with the mid to distal sartorius muscle measuring 10.9 x 5.4 x 5.7 cm.  It contains central hypodensity that may represent central necrosis or serous fluid with the.  MRI showed heterogeneously he has a soft tissue mass locally involving adjacent right sartorius muscle within the mid to distal thigh measuring 10 x 6.7 x 5.7 cm.  Central area of nonenhancing hypointensity are noted within this mass that may represent an area of necrosis or fibrosis.  patient did not recall any trauma to the inner thigh region that may have precipitated the development of this mass.  Denies any fever or chills.   07/20/2017 She underwent EGD resection of gastric mass which showed high grade dysplasia /adenocarcinoma in situ.   Review of Systems  Constitutional: Positive for malaise/fatigue. Negative for chills and fever.  HENT: Positive for hearing loss.   Eyes: Negative for double vision and photophobia.  Respiratory: Negative for cough and hemoptysis.   Cardiovascular: Negative for chest pain and palpitations.  Gastrointestinal: Negative for nausea and vomiting.    Genitourinary: Negative for dysuria.  Musculoskeletal: Negative for myalgias.       Right thigh mass  Skin: Negative for rash.  Neurological: Negative for dizziness.  Endo/Heme/Allergies: Bruises/bleeds easily.  Psychiatric/Behavioral: Negative for depression.      No Known Allergies   Past Medical History:  Diagnosis Date  . Arrhythmia   . Atrial fibrillation ([place])   . CHF (congestive heart failure) ([place])   . Hearing loss   . Hypertension      Past Surgical History:  Procedure Laterality Date  . COLONOSCOPY N/A 04/12/2017   Procedure: COLONOSCOPY;  Surgeon: Toledo, Benay Pike, MD;  Location: [facility] ENDOSCOPY;  Service: Gastroenterology;  Laterality: N/A;  . ESOPHAGOGASTRODUODENOSCOPY N/A 04/12/2017   Procedure: ESOPHAGOGASTRODUODENOSCOPY (EGD);  Surgeon: Toledo, Benay Pike, MD;  Location: [facility] ENDOSCOPY;  Service: Gastroenterology;  Laterality: N/A;    Social History   Socioeconomic History  . Marital status: Widowed    Spouse name: Not on file  . Number of children: Not on file  . Years of education: Not on file  . Highest education level: Not on file  Occupational History  . Not on file  Social Needs  . Financial resource strain: Not on file  . Food insecurity:    Worry: Not on file    Inability: Not on file  . Transportation needs:    Medical: Not on file    Non-medical: Not on file  Tobacco Use  . Smoking status: Former Smoker    Types: Cigarettes    Last attempt to quit: 01/06/1993    Years since quitting: 24.5  . Smokeless tobacco: Never Used  Substance and Sexual Activity  .  Alcohol use: No  . Drug use: Never  . Sexual activity: Not Currently  Lifestyle  . Physical activity:    Days per week: Not on file    Minutes per session: Not on file  . Stress: Not on file  Relationships  . Social connections:    Talks on phone: Not on file    Gets together: Not on file    Attends religious service: Not on file    Active member of club or organization: Not on  file    Attends meetings of clubs or organizations: Not on file    Relationship status: Not on file  . Intimate partner violence:    Fear of current or ex partner: Not on file    Emotionally abused: Not on file    Physically abused: Not on file    Forced sexual activity: Not on file  Other Topics Concern  . Not on file  Social History Narrative  . Not on file    Family History  Problem Relation Age of Onset  . Breast cancer Mother   . Hyperlipidemia Father   . Lung cancer Brother   . Heart attack Brother   . Heart Problems Brother      Current Outpatient Medications:  .  acetaminophen (TYLENOL) 325 MG tablet, Take 2 tablets (650 mg total) by mouth every 6 (six) hours as needed for mild pain (or Fever >/= 101)., Disp: , Rfl:  .  furosemide (LASIX) 40 MG tablet, Take 0.5 tablets (20 mg total) by mouth daily., Disp: , Rfl:  .  metoprolol succinate (TOPROL-XL) 100 MG 24 hr tablet, Take 100 mg by mouth daily. Take with or immediately following a meal., Disp: , Rfl:  .  potassium chloride SA (K-DUR,KLOR-CON) 20 MEQ tablet, Take 1 tablet (20 mEq total) by mouth daily., Disp: , Rfl:  .  ranitidine (ZANTAC) 150 MG tablet, Take 150 mg by mouth 2 (two) times daily as needed for heartburn., Disp: , Rfl:  .  Rivaroxaban (XARELTO) 15 MG TABS tablet, Take 1 tablet (15 mg total) by mouth daily with supper., Disp: 30 tablet, Rfl: 0 .  traZODone (DESYREL) 100 MG tablet, Take 1 tablet (100 mg total) by mouth at bedtime as needed for sleep. (Patient taking differently: Take 100 mg by mouth at bedtime. ), Disp: 30 tablet, Rfl: 0 .  triamcinolone cream (KENALOG) 0.1 %, Apply 1 application topically 2 (two) times daily., Disp: , Rfl:   Physical exam:  Vitals:   08/03/17 1524  BP: 118/80  Pulse: (!) 127  Resp: 20  Temp: 99.8 F (37.7 C)  SpO2: 92%  Weight: 159 lb 9.6 oz (72.4 kg)  Height: 5\' 2"  (1.575 m)   Physical Exam  Constitutional: She is oriented to person, place, and time. No distress.    Frail appearance ederly female sitting in wheel chair   HENT:  Head: Normocephalic.  Eyes: EOM are normal.  Pale conjunctivae  Neck: Normal range of motion. Neck supple.  Cardiovascular:  Murmur heard. tachycardia  Pulmonary/Chest: Effort normal. No stridor. No respiratory distress. She has no wheezes.  Abdominal: Soft. She exhibits no distension.  Musculoskeletal: Normal range of motion.  Large right thigh soft tissue mass, increased local temperature.   Neurological: She is alert and oriented to person, place, and time.  Skin: Skin is warm and dry.  Psychiatric: She has a normal mood and affect. Her behavior is normal.    CMP Latest Ref Rng & Units 08/03/2017  Glucose  70 - 99 mg/dL 119(H)  BUN 8 - 23 mg/dL 23  Creatinine 0.44 - 1.00 mg/dL 1.70(H)  Sodium 135 - 145 mmol/L 137  Potassium 3.5 - 5.1 mmol/L 4.2  Chloride 98 - 111 mmol/L 101  CO2 22 - 32 mmol/L 24  Calcium 8.9 - 10.3 mg/dL 8.4(L)  Total Protein 6.5 - 8.1 g/dL -  Total Bilirubin 0.3 - 1.2 mg/dL -  Alkaline Phos 38 - 126 U/L -  AST 15 - 41 U/L -  ALT 14 - 54 U/L -   CBC Latest Ref Rng & Units 07/25/2017  WBC 3.6 - 11.0 K/uL 7.8  Hemoglobin 12.0 - 16.0 g/dL 8.3(L)  Hematocrit 35.0 - 47.0 % 25.5(L)  Platelets 150 - 440 K/uL 187   RADIOGRAPHIC STUDIES: I have personally reviewed the radiological images as listed and agreed with the findings in the report. Ct Femur Right W Contrast  Result Date: 07/22/2017 CLINICAL DATA:  Right leg pain. Patient denies trauma. Swelling of the mid thigh all the way down to the foot. EXAM: CT OF THE LOWER RIGHT EXTREMITY WITH CONTRAST TECHNIQUE: Multidetector CT imaging of the lower right extremity from hip through right foot was performed according to the standard protocol following intravenous contrast administration. COMPARISON:  None. CONTRAST:  74mL OMNIPAQUE IOHEXOL 300 MG/ML  SOLN FINDINGS: Bones/Joint/Cartilage 1. Right hip: The femoral head is spherical in appearance. No  flattening or evidence of fracture. Mild degenerative joint space narrowing of the right hip. 2. Right femur: No acute fracture or suspicious osseous lesions. No frank bone destruction. 3. Right knee: Intact right total knee arthroplasty without complicating features. No loosening or joint effusion is identified. 4. Right tibia and fibula: Negative. Tibial tray of the right knee arthroplasty appears intact. 5. Patella: Intact patellar resurfacing.  No fracture. 6. Ankle joint: Intact without joint effusion. 7. Tibia and fibula: Intact without acute osseous abnormality. 8. Subtalar and midfoot articulations: Intact. 9. Right foot and ankle: Intact Ligaments Suboptimally assessed by CT. Muscles and Tendons Mild edema of the sartorius possibly related to muscle strain or tracking of subcutaneous soft tissue edema. Along the medial aspect of the right thigh, abutting and possibly arising from the sartorius is a masslike abnormality measuring 10.9 x 5.4 x 5.7 cm in craniocaudad by transverse by AP dimension. It contains central areas of hypodensity that may represent serous fluid from a large hematoma versus a soft tissue mass/sarcoma with central necrosis. Soft tissues Circumferential soft tissue edema of the right leg, ankle and foot compatible with venous insufficiency and pitting edema versus cellulitis. Clinical correlation is therefore recommended. IMPRESSION: 1. The dominant finding is that of a soft tissue mass-like abnormality intimately associated with the mid to distal sartorius muscle measuring 10.9 x 5.4 x 5.7 cm. It contains central hypodensity that may represent central necrosis or serous fluid within. Differential possibilities may include a large soft tissue hematoma with variable degrees of liquefaction, soft tissue abscess or soft tissue mass/sarcoma are some considerations. Short-term interval follow-up for reassessment is recommended. Should findings persist or worsen, percutaneous sampling may be  necessary. 2. No acute osseous abnormality. 3. Soft tissue edema of the right leg, ankle and foot either representing stigmata of venous insufficiency and soft tissue pitting edema versus cellulitis among some possibilities. Electronically Signed   By: Ashley Royalty M.D.   On: 07/22/2017 23:32   Ct Tibia Fibula Right W Contrast  Result Date: 07/22/2017 CLINICAL DATA:  Right leg pain. Patient denies trauma. Swelling of the mid  thigh all the way down to the foot. EXAM: CT OF THE LOWER RIGHT EXTREMITY WITH CONTRAST TECHNIQUE: Multidetector CT imaging of the lower right extremity from hip through right foot was performed according to the standard protocol following intravenous contrast administration. COMPARISON:  None. CONTRAST:  48mL OMNIPAQUE IOHEXOL 300 MG/ML  SOLN FINDINGS: Bones/Joint/Cartilage 1. Right hip: The femoral head is spherical in appearance. No flattening or evidence of fracture. Mild degenerative joint space narrowing of the right hip. 2. Right femur: No acute fracture or suspicious osseous lesions. No frank bone destruction. 3. Right knee: Intact right total knee arthroplasty without complicating features. No loosening or joint effusion is identified. 4. Right tibia and fibula: Negative. Tibial tray of the right knee arthroplasty appears intact. 5. Patella: Intact patellar resurfacing.  No fracture. 6. Ankle joint: Intact without joint effusion. 7. Tibia and fibula: Intact without acute osseous abnormality. 8. Subtalar and midfoot articulations: Intact. 9. Right foot and ankle: Intact Ligaments Suboptimally assessed by CT. Muscles and Tendons Mild edema of the sartorius possibly related to muscle strain or tracking of subcutaneous soft tissue edema. Along the medial aspect of the right thigh, abutting and possibly arising from the sartorius is a masslike abnormality measuring 10.9 x 5.4 x 5.7 cm in craniocaudad by transverse by AP dimension. It contains central areas of hypodensity that may  represent serous fluid from a large hematoma versus a soft tissue mass/sarcoma with central necrosis. Soft tissues Circumferential soft tissue edema of the right leg, ankle and foot compatible with venous insufficiency and pitting edema versus cellulitis. Clinical correlation is therefore recommended. IMPRESSION: 1. The dominant finding is that of a soft tissue mass-like abnormality intimately associated with the mid to distal sartorius muscle measuring 10.9 x 5.4 x 5.7 cm. It contains central hypodensity that may represent central necrosis or serous fluid within. Differential possibilities may include a large soft tissue hematoma with variable degrees of liquefaction, soft tissue abscess or soft tissue mass/sarcoma are some considerations. Short-term interval follow-up for reassessment is recommended. Should findings persist or worsen, percutaneous sampling may be necessary. 2. No acute osseous abnormality. 3. Soft tissue edema of the right leg, ankle and foot either representing stigmata of venous insufficiency and soft tissue pitting edema versus cellulitis among some possibilities. Electronically Signed   By: Ashley Royalty M.D.   On: 07/22/2017 23:32   Mr Femur Right W Wo Contrast  Result Date: 07/24/2017 CLINICAL DATA:  Medial right thigh mass EXAM: MRI OF THE RIGHT FEMUR WITHOUT AND WITH CONTRAST TECHNIQUE: Multiplanar, multisequence MR imaging of the right thigh was performed both before and after administration of intravenous contrast. CONTRAST:  61mL MULTIHANCE GADOBENATE DIMEGLUMINE 529 MG/ML IV SOLN COMPARISON:  07/22/2017 CT FINDINGS: Bones/Joint/Cartilage Bilateral knee arthroplasties are noted limiting assessment of the femoral condyles, patella and tibial plateaus. No suspicious marrow signal abnormality, periostitis or bone destruction is identified. Ligaments Noncontributory Muscles and Tendons See below Soft tissues A heterogeneous, mixed attenuating soft tissue mass is noted along the medial  aspect of the distal right thigh measuring approximately 10 x 6.7 x 5.2 cm. It appears to be intimately associated with the right sartorius muscle suggesting local invasion of the muscle. On T1 weighted imaging and is intermediate in signal intensity and slightly higher in signal intensity relative to muscle. On T2 weighted images, it is predominantly hyperintense relative to muscle and upon contrast administration, it demonstrates enhancement predominantly along the periphery with central area of nonenhancing hypointensity that may represent areas of necrosis or fibrosis.  There are thin hypointense bands noted within this mass that may represent linear bands of collagen. There is generalized interdigitating soft tissue edema of the right thigh causing mild asymmetry in size of the thighs, right greater than left. No abnormal drainable fluid collection is seen. IMPRESSION: Heterogeneously enhancing soft tissue mass locally involving the adjacent right sartorius muscle within the mid to distal thigh measuring 10 x 6.7 x 5.7 cm. Central areas of nonenhancing hypointensity are noted within this mass that may represent areas of necrosis and/or fibrosis. Differential considerations would include an extra abdominal aggressive fibromatosis/desmoid though a would be difficult to exclude sarcoma from this differential. Percutaneous sampling of this mass may be necessary for better assessment. Given enhancement, hematoma is unlikely. Abscess is also believed unlikely given the relative enhancement seen. Bilateral knee arthroplasties. No acute osseous involvement of the right femur by this lesion. Electronically Signed   By: Ashley Royalty M.D.   On: 07/24/2017 23:20   US Venous Img Lower Unilateral Right  Result Date: 07/22/2017 CLINICAL DATA:  Medial upper thigh pain and redness x2 weeks EXAM: RIGHT LOWER EXTREMITY VENOUS DOPPLER ULTRASOUND TECHNIQUE: Gray-scale sonography with compression, as well as color and duplex  ultrasound, were performed to evaluate the deep venous system from the level of the common femoral vein through the popliteal and proximal calf veins. COMPARISON:  02/15/2001 by report only FINDINGS: Normal compressibility of the common femoral, superficial femoral, and popliteal veins, as well as the proximal calf veins. No filling defects to suggest DVT on grayscale or color Doppler imaging. Doppler waveforms show normal direction of venous flow, normal respiratory phasicity and response to augmentation. Subcutaneous edema in the calf. Right inguinal lymph nodes up to 1.2 cm short axis diameter. There is a poorly marginated very hypoechoic region in the deep subcutaneous tissues of the medial right thigh measuring 9.2 x 3.9 x 5.7 cm showing hyperemia on color Doppler. Survey views of the contralateral common femoral vein are unremarkable. IMPRESSION: 1.  No evidence of right lower extremity deep vein thrombosis. 2. 9.2 cm right medial thigh lesion more concerning for neoplasm than abscess, corresponding to region of concern. Consider MR with contrast for further characterization. Electronically Signed   By: Lucrezia Europe M.D.   On: 07/22/2017 19:56     Assessment and plan  1. Mass of right thigh   2. Anemia, unspecified type   3. Adenocarcinoma in situ    # Right thigh mass. ?soft tissue sarcoma. Images were independently reviewed and discussed with patient.  Recommend to [facility] for further evaluation/biopsy/resection  # Anemia, will send anemia work up  Labs were reviewed. Iron panel is consistent with iron deficiency. Will start patient on oral iron supplements with ferrous 325mg  BID. Colace 100mg  PRN for constipation. # Gastric adenocarcinoma in situ, recommend surveillance.    Orders Placed This Encounter  Procedures  . MR FEMUR RIGHT W WO CONTRAST    Right thigh mass    Standing Status:   Future    Standing Expiration Date:   10/05/2018    Order  Specific Question:   ** REASON FOR EXAM (FREE TEXT)    Answer:   right thigh mass    Order Specific Question:   If indicated for the ordered procedure, I authorize the administration of contrast media per Radiology protocol    Answer:   Yes    Order Specific Question:   What is the patient's sedation requirement?    Answer:  No Sedation    Order Specific Question:   Does the patient have a pacemaker or implanted devices?    Answer:   No    Order Specific Question:   Call Results- Best Contact Number?    Answer:   right thigh mass    Order Specific Question:   Radiology Contrast Protocol - do NOT remove file path    Answer:   [email or web address]    Order Specific Question:   Preferred imaging location?    Answer:   [facility] (table limit-300lbs)  . Iron and TIBC    Standing Status:   Future    Number of Occurrences:   1    Standing Expiration Date:   08/04/2018  . Basic metabolic panel    Standing Status:   Future    Number of Occurrences:   1    Standing Expiration Date:   08/03/2018  . Ferritin    Standing Status:   Future    Number of Occurrences:   1    Standing Expiration Date:   08/04/2018  . Folate    Standing Status:   Future    Number of Occurrences:   1    Standing Expiration Date:   08/04/2018  . Vitamin B12    Standing Status:   Future    Number of Occurrences:   1    Standing Expiration Date:   08/04/2018  . Reticulocytes    Standing Status:   Future    Number of Occurrences:   1    Standing Expiration Date:   08/04/2018  . Protein electrophoresis, serum    Standing Status:   Future    Number of Occurrences:   1    Standing Expiration Date:   08/04/2018     Follow up in 4 weeks.   Total face to face encounter time for this patient visit was 25 min. >50% of the time was  spent in counseling and coordination of care.  Earlie Server, MD, PhD Hematology Oncology [facility] Pager- [phone] 08/03/2017

## 2017-08-03 NOTE — Progress Notes (Signed)
Patient here for initial evaluation. No concerns voice.

## 2017-08-04 LAB — PROTEIN ELECTROPHORESIS, SERUM
A/G RATIO SPE: 0.6 — AB (ref 0.7–1.7)
ALPHA-1-GLOBULIN: 0.4 g/dL (ref 0.0–0.4)
ALPHA-2-GLOBULIN: 0.9 g/dL (ref 0.4–1.0)
Albumin ELP: 2.6 g/dL — ABNORMAL LOW (ref 2.9–4.4)
BETA GLOBULIN: 1 g/dL (ref 0.7–1.3)
Gamma Globulin: 1.9 g/dL — ABNORMAL HIGH (ref 0.4–1.8)
Globulin, Total: 4.1 g/dL — ABNORMAL HIGH (ref 2.2–3.9)
Total Protein ELP: 6.7 g/dL (ref 6.0–8.5)

## 2017-08-04 MED ORDER — DOCUSATE SODIUM 100 MG PO CAPS: 100.0000 mg | ORAL_CAPSULE | Freq: Every day | ORAL | 2 refills | Status: DC | PRN

## 2017-08-04 MED ORDER — FERROUS SULFATE 325 (65 FE) MG PO TBEC: 325.0000 mg | DELAYED_RELEASE_TABLET | Freq: Two times a day (BID) | ORAL | 3 refills | Status: DC

## 2017-08-13 ENCOUNTER — Emergency Department: Payer: Medicare Other

## 2017-08-13 ENCOUNTER — Other Ambulatory Visit: Payer: Self-pay

## 2017-08-13 ENCOUNTER — Inpatient Hospital Stay
Admission: EM | Admit: 2017-08-13 | Discharge: 2017-08-16 | DRG: 071 | Disposition: A | Discharge status: Home Health | Payer: Medicare Other | Attending: Family Medicine | Admitting: Family Medicine

## 2017-08-13 ENCOUNTER — Ambulatory Visit: Admission: RE | Admit: 2017-08-13 | Payer: Medicare HMO | Source: Ambulatory Visit

## 2017-08-13 DIAGNOSIS — I481 Persistent atrial fibrillation: Secondary | ICD-10-CM | POA: Diagnosis present

## 2017-08-13 DIAGNOSIS — Z8349 Family history of other endocrine, nutritional and metabolic diseases: Secondary | ICD-10-CM

## 2017-08-13 DIAGNOSIS — Z87891 Personal history of nicotine dependence: Secondary | ICD-10-CM

## 2017-08-13 DIAGNOSIS — R4182 Altered mental status, unspecified: Secondary | ICD-10-CM | POA: Diagnosis present

## 2017-08-13 DIAGNOSIS — F0390 Unspecified dementia without behavioral disturbance: Secondary | ICD-10-CM | POA: Diagnosis not present

## 2017-08-13 DIAGNOSIS — N189 Chronic kidney disease, unspecified: Secondary | ICD-10-CM | POA: Diagnosis present

## 2017-08-13 DIAGNOSIS — Z801 Family history of malignant neoplasm of trachea, bronchus and lung: Secondary | ICD-10-CM

## 2017-08-13 DIAGNOSIS — Z7901 Long term (current) use of anticoagulants: Secondary | ICD-10-CM

## 2017-08-13 DIAGNOSIS — H9190 Unspecified hearing loss, unspecified ear: Secondary | ICD-10-CM | POA: Diagnosis present

## 2017-08-13 DIAGNOSIS — Z8249 Family history of ischemic heart disease and other diseases of the circulatory system: Secondary | ICD-10-CM | POA: Diagnosis not present

## 2017-08-13 DIAGNOSIS — Z66 Do not resuscitate: Secondary | ICD-10-CM | POA: Diagnosis not present

## 2017-08-13 DIAGNOSIS — N390 Urinary tract infection, site not specified: Secondary | ICD-10-CM | POA: Diagnosis present

## 2017-08-13 DIAGNOSIS — M6281 Muscle weakness (generalized): Secondary | ICD-10-CM | POA: Diagnosis not present

## 2017-08-13 DIAGNOSIS — I5032 Chronic diastolic (congestive) heart failure: Secondary | ICD-10-CM | POA: Diagnosis present

## 2017-08-13 DIAGNOSIS — L8990 Pressure ulcer of unspecified site, unspecified stage: Secondary | ICD-10-CM | POA: Diagnosis not present

## 2017-08-13 DIAGNOSIS — G9341 Metabolic encephalopathy: Principal | ICD-10-CM | POA: Diagnosis present

## 2017-08-13 DIAGNOSIS — Z803 Family history of malignant neoplasm of breast: Secondary | ICD-10-CM

## 2017-08-13 DIAGNOSIS — I130 Hypertensive heart and chronic kidney disease with heart failure and stage 1 through stage 4 chronic kidney disease, or unspecified chronic kidney disease: Secondary | ICD-10-CM | POA: Diagnosis not present

## 2017-08-13 DIAGNOSIS — G459 Transient cerebral ischemic attack, unspecified: Secondary | ICD-10-CM

## 2017-08-13 DIAGNOSIS — G9340 Encephalopathy, unspecified: Secondary | ICD-10-CM | POA: Diagnosis present

## 2017-08-13 LAB — CBC WITH DIFFERENTIAL/PLATELET
BASOS PCT: 0 %
Basophils Absolute: 0 10*3/uL (ref 0–0.1)
EOS ABS: 0.2 10*3/uL (ref 0–0.7)
Eosinophils Relative: 1 %
HEMATOCRIT: 34.7 % — AB (ref 35.0–47.0)
HEMOGLOBIN: 10.9 g/dL — AB (ref 12.0–16.0)
LYMPHS ABS: 1.5 10*3/uL (ref 1.0–3.6)
Lymphocytes Relative: 11 %
MCH: 25.4 pg — AB (ref 26.0–34.0)
MCHC: 31.3 g/dL — AB (ref 32.0–36.0)
MCV: 81.1 fL (ref 80.0–100.0)
MONO ABS: 1 10*3/uL — AB (ref 0.2–0.9)
MONOS PCT: 8 %
NEUTROS ABS: 10.7 10*3/uL — AB (ref 1.4–6.5)
Neutrophils Relative %: 80 %
Platelets: 313 10*3/uL (ref 150–440)
RBC: 4.28 MIL/uL (ref 3.80–5.20)
RDW: 16.6 % — AB (ref 11.5–14.5)
WBC: 13.3 10*3/uL — ABNORMAL HIGH (ref 3.6–11.0)

## 2017-08-13 LAB — URINALYSIS, COMPLETE (UACMP) WITH MICROSCOPIC
Bacteria, UA: NONE SEEN
Bilirubin Urine: NEGATIVE
GLUCOSE, UA: NEGATIVE mg/dL
Hgb urine dipstick: NEGATIVE
KETONES UR: 5 mg/dL — AB
Leukocytes, UA: NEGATIVE
NITRITE: NEGATIVE
PH: 5 (ref 5.0–8.0)
Protein, ur: NEGATIVE mg/dL
Specific Gravity, Urine: 1.011 (ref 1.005–1.030)

## 2017-08-13 LAB — COMPREHENSIVE METABOLIC PANEL
ALBUMIN: 2.9 g/dL — AB (ref 3.5–5.0)
ALK PHOS: 78 U/L (ref 38–126)
ALT: 10 U/L (ref 0–44)
ANION GAP: 9 (ref 5–15)
AST: 25 U/L (ref 15–41)
BILIRUBIN TOTAL: 0.8 mg/dL (ref 0.3–1.2)
BUN: 26 mg/dL — ABNORMAL HIGH (ref 8–23)
CALCIUM: 8.8 mg/dL — AB (ref 8.9–10.3)
CO2: 27 mmol/L (ref 22–32)
Chloride: 103 mmol/L (ref 98–111)
Creatinine, Ser: 1.55 mg/dL — ABNORMAL HIGH (ref 0.44–1.00)
GFR calc non Af Amer: 29 mL/min — ABNORMAL LOW (ref >60)
GFR, EST AFRICAN AMERICAN: 34 mL/min — AB (ref >60)
GLUCOSE: 105 mg/dL — AB (ref 70–99)
POTASSIUM: 4.2 mmol/L (ref 3.5–5.1)
Sodium: 139 mmol/L (ref 135–145)
TOTAL PROTEIN: 7.2 g/dL (ref 6.5–8.1)

## 2017-08-13 LAB — BRAIN NATRIURETIC PEPTIDE: B NATRIURETIC PEPTIDE 5: 729 pg/mL — AB (ref 0.0–100.0)

## 2017-08-13 LAB — TROPONIN I: Troponin I: <0.03 ng/mL (ref <0.03)

## 2017-08-13 MED ORDER — STROKE: EARLY STAGES OF RECOVERY BOOK
Freq: Once | Status: AC
  Administered 2017-08-13: 17:00:00

## 2017-08-13 MED ORDER — RIVAROXABAN 15 MG PO TABS
15.0000 mg | ORAL_TABLET | Freq: Every day | ORAL | Status: DC
  Administered 2017-08-14 – 2017-08-15 (×2): 15 mg via ORAL
  Filled 2017-08-13 (×3): qty 1

## 2017-08-13 MED ORDER — METOPROLOL SUCCINATE ER 50 MG PO TB24
100.0000 mg | ORAL_TABLET | Freq: Every day | ORAL | Status: DC
  Administered 2017-08-13: 18:00:00 100 mg via ORAL
  Filled 2017-08-13: qty 2

## 2017-08-13 MED ORDER — SODIUM CHLORIDE 0.9 % IV SOLN
INTRAVENOUS | Status: DC
  Administered 2017-08-13: 17:00:00 via INTRAVENOUS

## 2017-08-13 MED ORDER — ASPIRIN 81 MG PO CHEW
81.0000 mg | CHEWABLE_TABLET | Freq: Every day | ORAL | Status: DC
Start: 2017-08-13 — End: 2017-08-14
  Administered 2017-08-13 – 2017-08-14 (×2): 81 mg via ORAL
  Filled 2017-08-13 (×2): qty 1

## 2017-08-13 NOTE — ED Notes (Signed)
Pt placed on bedpan at this time, no urine noted.

## 2017-08-13 NOTE — ED Triage Notes (Signed)
Pt arrived via ems for report of shortness of breath - when EMS arrived pt had no recollection of why EMS had been called - she is at home by herself and normally alert and oriented x4 - today she is only oriented to person - pt has hx of afib and is 90-120 - has COPD and is on 3L o2 continuously

## 2017-08-13 NOTE — ED Provider Notes (Signed)
[facility] Emergency Department Provider Note   ____________________________________________   First MD Initiated Contact with Patient 08/13/17 [phone]     (approximate)  I have reviewed the triage vital signs and the nursing notes.   HISTORY  Chief Complaint Altered Mental Status   HPI Mercedes Rodriguez is a 84 y.o. female with a history of atrial fibrillation as well as CHF on Xarelto who was presented to the emergency department with altered mental status as well as shortness of breath.  EMS was originally called out for shortness of breath.  However, once they arrived the patient was not complaining of any shortness of breath.  However, she appeared confused.  Denies any pain or shortness of breath at this time.  No history of cough or fever.   Past Medical History:  Diagnosis Date  . Arrhythmia   . Atrial fibrillation ([place])   . CHF (congestive heart failure) ([place])   . Hearing loss   . Hypertension     Patient Active Problem List   Diagnosis Date Noted  . Cellulitis of right lower extremity 07/23/2017  . Pain of right lower extremity   . Localized swelling, mass and lump, lower limb   . GIB (gastrointestinal bleeding) 04/09/2017  . Bradycardia 02/21/2017  . Sinus bradycardia 02/20/2017  . HTN (hypertension) 01/29/2017  . Hard of hearing 01/21/2017  . Atrial fibrillation with RVR ([place])   . Palliative care encounter   . CHF (congestive heart failure) ([place]) 12/31/2016    Past Surgical History:  Procedure Laterality Date  . COLONOSCOPY N/A 04/12/2017   Procedure: COLONOSCOPY;  Surgeon: Toledo, Benay Pike, MD;  Location: [facility] ENDOSCOPY;  Service: Gastroenterology;  Laterality: N/A;  . ESOPHAGOGASTRODUODENOSCOPY N/A 04/12/2017   Procedure: ESOPHAGOGASTRODUODENOSCOPY (EGD);  Surgeon: Toledo, Benay Pike, MD;  Location: [facility] ENDOSCOPY;  Service: Gastroenterology;  Laterality: N/A;    Prior to Admission medications   Medication Sig Start Date End Date  Taking? Authorizing Provider  ferrous sulfate 325 (65 FE) MG EC tablet Take 1 tablet (325 mg total) by mouth 2 (two) times daily with a meal. 08/04/17  Yes Earlie Server, MD  furosemide (LASIX) 40 MG tablet Take 0.5 tablets (20 mg total) by mouth daily. 07/25/17  Yes Pyreddy, Reatha Harps, MD  metoprolol succinate (TOPROL-XL) 100 MG 24 hr tablet Take 100 mg by mouth daily. Take with or immediately following a meal.   Yes [provider]  potassium chloride SA (K-DUR,KLOR-CON) 20 MEQ tablet Take 1 tablet (20 mEq total) by mouth daily. 02/23/17  Yes Wieting, Richard, MD  Rivaroxaban (XARELTO) 15 MG TABS tablet Take 1 tablet (15 mg total) by mouth daily with supper. 02/23/17  Yes Loletha Grayer, MD  traZODone (DESYREL) 100 MG tablet Take 1 tablet (100 mg total) by mouth at bedtime as needed for sleep. Patient taking differently: Take 100 mg by mouth at bedtime.  02/23/17  Yes Loletha Grayer, MD  acetaminophen (TYLENOL) 325 MG tablet Take 2 tablets (650 mg total) by mouth every 6 (six) hours as needed for mild pain (or Fever >/= 101). 01/07/17   [name], MD  docusate sodium (COLACE) 100 MG capsule Take 1 capsule (100 mg total) by mouth daily as needed for mild constipation or moderate constipation. Do not take if you have loose stools 08/04/17   Earlie Server, MD  ranitidine (ZANTAC) 150 MG tablet Take 150 mg by mouth 2 (two) times daily as needed for heartburn.    [provider]  triamcinolone cream (KENALOG)  0.1 % Apply 1 application topically 2 (two) times daily.    [provider]    Allergies Patient has no known allergies.  Family History  Problem Relation Age of Onset  . Breast cancer Mother   . Hyperlipidemia Father   . Lung cancer Brother   . Heart attack Brother   . Heart Problems Brother     Social History Social History   Tobacco Use  . Smoking status: Former Smoker    Types: Cigarettes    Last attempt to quit: 01/06/1993    Years since quitting: 24.6  . Smokeless  tobacco: Never Used  Substance Use Topics  . Alcohol use: No  . Drug use: Never    Review of Systems  Constitutional: No fever/chills Eyes: No visual changes. ENT: No sore throat. Cardiovascular: Denies chest pain. Respiratory: As above Gastrointestinal: No abdominal pain.  No nausea, no vomiting.  No diarrhea.  No constipation. Genitourinary: Negative for dysuria. Musculoskeletal: Negative for back pain. Skin: Negative for rash. Neurological: Negative for headaches, focal weakness or numbness.   ____________________________________________   PHYSICAL EXAM:  VITAL SIGNS: ED Triage Vitals  Enc Vitals Group     BP 08/13/17 0910 126/78     Pulse Rate 08/13/17 0910 (!) 115     Resp 08/13/17 0910 20     Temp 08/13/17 0910 98.4 F (36.9 C)     Temp Source 08/13/17 0910 Oral     SpO2 08/13/17 0907 98 %     Weight 08/13/17 0911 160 lb (72.6 kg)     Height 08/13/17 0911 5' (1.524 m)     Head Circumference --      Peak Flow --      Pain Score 08/13/17 0911 0     Pain Loc --      Pain Edu? --      Excl. in [place]? --     Constitutional: Alert and oriented to self. Eyes: Conjunctivae are normal.  Head: Atraumatic. Nose: No congestion/rhinnorhea. Mouth/Throat: Mucous membranes are moist.  Neck: No stridor.   Cardiovascular: Tachycardic with an irregularly irregular rhythm.  Grossly normal heart sounds.  Respiratory: Normal respiratory effort.  No retractions.  Rales to the bilateral bases.  . Gastrointestinal: Soft and nontender. No distention. No CVA tenderness. Musculoskeletal: No lower extremity tenderness nor edema.  No joint effusions. Neurologic:  Normal speech and language. No gross focal neurologic deficits are appreciated. Skin:  Skin is warm, dry and intact. No rash noted. Psychiatric: Mood and affect are normal. Speech and behavior are normal.  ____________________________________________   LABS (all labs ordered are listed, but only abnormal results are  displayed)  Labs Reviewed  CBC WITH DIFFERENTIAL/PLATELET - Abnormal; Notable for the following components:      Result Value   WBC 13.3 (*)    Hemoglobin 10.9 (*)    HCT 34.7 (*)    MCH 25.4 (*)    MCHC 31.3 (*)    RDW 16.6 (*)    Neutro Abs 10.7 (*)    Monocytes Absolute 1.0 (*)    All other components within normal limits  COMPREHENSIVE METABOLIC PANEL - Abnormal; Notable for the following components:   Glucose, Bld 105 (*)    BUN 26 (*)    Creatinine, Ser 1.55 (*)    Calcium 8.8 (*)    Albumin 2.9 (*)    GFR calc non Af Amer 29 (*)    GFR calc Af Amer 34 (*)    All other  components within normal limits  BRAIN NATRIURETIC PEPTIDE - Abnormal; Notable for the following components:   B Natriuretic Peptide 729.0 (*)    All other components within normal limits  URINALYSIS, COMPLETE (UACMP) WITH MICROSCOPIC - Abnormal; Notable for the following components:   Color, Urine YELLOW (*)    APPearance HAZY (*)    Ketones, ur 5 (*)    All other components within normal limits  TROPONIN I   ____________________________________________  EKG  ED ECG REPORT I, Doran Stabler, the attending physician, personally viewed and interpreted this ECG.   Date: 08/13/2017  EKG Time: 0911  Rate: 105  Rhythm: atrial fibrillation, rate 105  Axis: Normal  Intervals:none  ST&T Change: No ST segment elevation or depression.  No abnormal T wave inversion.  ____________________________________________  RADIOLOGY  No acute finding on CT the brain nor the chest x-ray. ____________________________________________   PROCEDURES  Procedure(s) performed:   Procedures  Critical Care performed:   ____________________________________________   INITIAL IMPRESSION / ASSESSMENT AND PLAN / ED COURSE  Pertinent labs & imaging results that were available during my care of the patient were reviewed by me and considered in my medical decision making (see chart for details).  Differential  diagnosis includes, but is not limited to, alcohol, illicit or prescription medications, or other toxic ingestion; intracranial pathology such as stroke or intracerebral hemorrhage; fever or infectious causes including sepsis; hypoxemia and/or hypercarbia; uremia; trauma; endocrine related disorders such as diabetes, hypoglycemia, and thyroid-related diseases; hypertensive encephalopathy; etc. Differential includes, but is not limited to, viral syndrome, bronchitis including COPD exacerbation, pneumonia, reactive airway disease including asthma, CHF including exacerbation with or without pulmonary/interstitial edema, pneumothorax, ACS, thoracic trauma, and pulmonary embolism. As part of my medical decision making, I reviewed the following data within the electronic MEDICAL RECORD NUMBER Notes from prior ED visits  ----------------------------------------- 12:49 PM on 08/13/2017 -----------------------------------------  Patient's son is now at the bedside.  States that the patient normally is able to ambulate on her own as well as have a normal conversation.  Says that the patient has been very confused and last seen normal at about 8 PM last night.  No focal deficit.  Patient to be to the hospital for further altered mental status work-up. ____________________________________________   FINAL CLINICAL IMPRESSION(S) / ED DIAGNOSES  Altered mental status.    NEW MEDICATIONS STARTED DURING THIS VISIT:  New Prescriptions   No medications on file     Note:  This document was prepared using Dragon voice recognition software and may include unintentional dictation errors.     Orbie Pyo, MD 08/13/17 1250

## 2017-08-13 NOTE — Plan of Care (Signed)
  Problem: Education: Goal: Knowledge of General Education information will improve Description Including pain rating scale, medication(s)/side effects and non-pharmacologic comfort measures Outcome: Progressing   

## 2017-08-13 NOTE — ED Notes (Signed)
This RN to transport pt to [address]. Floor aware we are en route.

## 2017-08-13 NOTE — H&P (Signed)
[facility] [place] NAME: Mercedes Rodriguez    MR#:  [id number]  DATE OF BIRTH:  1929-06-26  DATE OF ADMISSION:  08/13/2017  PRIMARY CARE PHYSICIAN: Lynnell Jude, MD   REQUESTING/REFERRING PHYSICIAN: Dr. Clearnce Hasten  CHIEF COMPLAINT: Altered mental status   Chief Complaint  Patient presents with  . Altered Mental Status    HISTORY OF PRESENT ILLNESS:  Mercedes Rodriguez  is a 84 y.o. female with a known history of atrial fibrillation on Xarelto, essential hypertension called EMS this morning, by the time EMS and patient appeared confused and does not know why she called EMS.  And being evaluated in the emergency room, we are asked to admit for evaluation of possible stroke.  Patient is alert, hard of hearing and tells me that she does not know why she called EMS.  Also appears to have expressive aphasia trying to tell something and she forgets what she wants to tell.  No obvious weakness of hands or legs.  Speech is clear, no facial droop.  Patient lives at home by herself.  Admitted on July 18 th for right thigh cellulitis.  PAST MEDICAL HISTORY:   Past Medical History:  Diagnosis Date  . Arrhythmia   . Atrial fibrillation ([place])   . CHF (congestive heart failure) ([place])   . Hearing loss   . Hypertension     PAST SURGICAL HISTOIRY:   Past Surgical History:  Procedure Laterality Date  . COLONOSCOPY N/A 04/12/2017   Procedure: COLONOSCOPY;  Surgeon: Toledo, Benay Pike, MD;  Location: [facility] ENDOSCOPY;  Service: Gastroenterology;  Laterality: N/A;  . ESOPHAGOGASTRODUODENOSCOPY N/A 04/12/2017   Procedure: ESOPHAGOGASTRODUODENOSCOPY (EGD);  Surgeon: Toledo, Benay Pike, MD;  Location: [facility] ENDOSCOPY;  Service: Gastroenterology;  Laterality: N/A;    SOCIAL HISTORY:   Social History   Tobacco Use  . Smoking status: Former Smoker    Types: Cigarettes    Last attempt to quit: 01/06/1993    Years since quitting: 24.6  . Smokeless tobacco: Never  Used  Substance Use Topics  . Alcohol use: No    FAMILY HISTORY:   Family History  Problem Relation Age of Onset  . Breast cancer Mother   . Hyperlipidemia Father   . Lung cancer Brother   . Heart attack Brother   . Heart Problems Brother     DRUG ALLERGIES:  No Known Allergies  REVIEW OF SYSTEMS:     Appears confused so unable to obtain review of systems because of confusion.  MEDICATIONS AT HOME:   Prior to Admission medications   Medication Sig Start Date End Date Taking? Authorizing Provider  ferrous sulfate 325 (65 FE) MG EC tablet Take 1 tablet (325 mg total) by mouth 2 (two) times daily with a meal. 08/04/17  Yes Earlie Server, MD  furosemide (LASIX) 40 MG tablet Take 0.5 tablets (20 mg total) by mouth daily. 07/25/17  Yes Pyreddy, Reatha Harps, MD  metoprolol succinate (TOPROL-XL) 100 MG 24 hr tablet Take 100 mg by mouth daily. Take with or immediately following a meal.   Yes [provider]  potassium chloride SA (K-DUR,KLOR-CON) 20 MEQ tablet Take 1 tablet (20 mEq total) by mouth daily. 02/23/17  Yes Wieting, Richard, MD  Rivaroxaban (XARELTO) 15 MG TABS tablet Take 1 tablet (15 mg total) by mouth daily with supper. 02/23/17  Yes Wieting, Richard, MD  traZODone (DESYREL) 100 MG tablet Take 1 tablet (100 mg total) by mouth at bedtime as needed for  sleep. Patient taking differently: Take 100 mg by mouth at bedtime.  02/23/17  Yes Loletha Grayer, MD  acetaminophen (TYLENOL) 325 MG tablet Take 2 tablets (650 mg total) by mouth every 6 (six) hours as needed for mild pain (or Fever >/= 101). 01/07/17   [name], MD  docusate sodium (COLACE) 100 MG capsule Take 1 capsule (100 mg total) by mouth daily as needed for mild constipation or moderate constipation. Do not take if you have loose stools 08/04/17   Earlie Server, MD  ranitidine (ZANTAC) 150 MG tablet Take 150 mg by mouth 2 (two) times daily as needed for heartburn.    [provider]  triamcinolone cream (KENALOG) 0.1 %  Apply 1 application topically 2 (two) times daily.    [provider]      VITAL SIGNS:  Blood pressure 103/78, pulse 95, temperature 97.8 F (36.6 C), resp. rate (!) 22, height 5' (1.524 m), weight 72.6 kg, SpO2 100 %.  PHYSICAL EXAMINATION:  GENERAL:  84 y.o.-year-old patient lying in the bed with no acute distress.  EYES: Pupils equal, round, reactive to light and accommodation. No scleral icterus. Extraocular muscles intact.  HEENT: Head atraumatic, normocephalic. Oropharynx and nasopharynx clear.  NECK:  Supple, no jugular venous distention. No thyroid enlargement, no tenderness.  LUNGS: Normal breath sounds bilaterally, no wheezing, rales,rhonchi or crepitation. No use of accessory muscles of respiration.  CARDIOVASCULAR: S1, S2 normal. No murmurs, rubs, or gallops.  ABDOMEN: Soft, nontender, nondistended. Bowel sounds present. No organomegaly or mass.  EXTREMITIES: No pedal edema, cyanosis, or clubbing.  NEUROLOGIC: Cranial nerves II through XII are intact. Muscle strength 5/5 in all extremities. Sensation intact. Gait not checked. Observed patient standing and using bedside tolilet in ER. PSYCHIATRIC: The patient is awake but confused . sKIN: No obvious rash, lesion, or ulcer.   LABORATORY PANEL:   CBC Recent Labs  Lab 08/13/17 0915  WBC 13.3*  HGB 10.9*  HCT 34.7*  PLT 313   ------------------------------------------------------------------------------------------------------------------  Chemistries  Recent Labs  Lab 08/13/17 0915  NA 139  K 4.2  CL 103  CO2 27  GLUCOSE 105*  BUN 26*  CREATININE 1.55*  CALCIUM 8.8*  AST 25  ALT 10  ALKPHOS 78  BILITOT 0.8   ------------------------------------------------------------------------------------------------------------------  Cardiac Enzymes Recent Labs  Lab 08/13/17 0915  TROPONINI <0.03    ------------------------------------------------------------------------------------------------------------------  RADIOLOGY:  Dg Chest 1 View  Result Date: 08/13/2017 CLINICAL DATA:  Shortness of Breath EXAM: CHEST  1 VIEW COMPARISON:  04/09/2017 FINDINGS: Cardiac shadow is enlarged. The lungs are well aerated bilaterally. No focal infiltrate or sizable effusion is seen. No bony abnormality is noted. IMPRESSION: No acute abnormality seen. Electronically Signed   By: Inez Catalina M.D.   On: 08/13/2017 10:10   Ct Head Wo Contrast  Result Date: 08/13/2017 CLINICAL DATA:  New onset confusion beginning today. EXAM: CT HEAD WITHOUT CONTRAST TECHNIQUE: Contiguous axial images were obtained from the base of the skull through the vertex without intravenous contrast. COMPARISON:  None. FINDINGS: Brain: Generalized brain atrophy with some temporal predominance. Chronic small-vessel ischemic changes of the hemispheric white matter. No sign of acute infarction, mass lesion, hemorrhage, hydrocephalus or extra-axial collection. Vascular: There is atherosclerotic calcification of the major vessels at the base of the brain. Skull: Normal Sinuses/Orbits: Clear/normal Other: None IMPRESSION: No acute finding by CT. Brain atrophy with some temporal lobe predominance. Chronic small-vessel ischemic changes throughout the cerebral hemispheric white matter. Electronically Signed   By: Nelson Chimes  M.D.   On: 08/13/2017 11:09    EKG:   Orders placed or performed during the hospital encounter of 08/13/17  . ED EKG  . ED EKG  . ED EKG  . ED EKG   Atrial fibrillation with 105 bpm IMPRESSION AND PLAN:   84 year old female patient with history of chronic A. fib on Xarelto, essential hypertension brought in because of confusion. 1.  Sudden onset of confusion: Evaluate for possible TIA/stroke.  Patient already on full dose anticoagulation.TIA is unlikely.  CT head did not show acute stroke/bleeding.  Due to her altered  mental status checking MRI of the brain, ultrasound of carotids, echocardiogram.  #2 metabolic encephalopathy with elevated white count, UTI: Start Rocephin. 3.  Chronic renal failure, baseline creatinine around 1.3.  Right nephrotoxic agents, creatinine is around 1.5 today. 4.  Chronic diastolic heart failure EF 50% by echocardiogram done on March 2019 by Dr. Nehemiah Massed.  Patient on Lasix at home and BNP slightly high at this time but she is medically looks dry so hold Lasix. 5 chronic atrial fibrillation: Patient is on beta-blockers,  If she  passes swallow evaluation restart blockers  All the records are reviewed and case discussed with ED provider. Management plans discussed with the patient, family and they are in agreement.  CODE STATUS: DNR  TOTAL TIME TAKING CARE OF THIS PATIENT: 55  minutes.    Epifanio Lesches M.D on 08/13/2017 at 4:12 PM  Between 7am to 6pm - Pager - [phone]  After 6pm go to [email or web address] - password [email or web address] [facility]  Office  [phone]  CC: Primary care physician; Lynnell Jude, MD  Note: This dictation was prepared with Dragon dictation along with smaller phrase technology. Any transcriptional errors that result from this process are unintentional.

## 2017-08-14 ENCOUNTER — Observation Stay: Payer: Medicare Other

## 2017-08-14 ENCOUNTER — Observation Stay
Admit: 2017-08-14 | Discharge: 2017-08-14 | Disposition: A | Discharge status: Home / Self Care | Payer: Medicare Other | Attending: Internal Medicine | Admitting: Internal Medicine

## 2017-08-14 DIAGNOSIS — R4182 Altered mental status, unspecified: Secondary | ICD-10-CM | POA: Diagnosis not present

## 2017-08-14 LAB — ECHOCARDIOGRAM COMPLETE
Height: 60 in
WEIGHTICAEL: 2560 [oz_av]

## 2017-08-14 LAB — TSH: TSH: 1.103 u[IU]/mL (ref 0.350–4.500)

## 2017-08-14 LAB — HEMOGLOBIN A1C
HEMOGLOBIN A1C: 5.4 % (ref 4.8–5.6)
MEAN PLASMA GLUCOSE: 108.28 mg/dL

## 2017-08-14 LAB — LIPID PANEL
CHOLESTEROL: 133 mg/dL (ref 0–200)
HDL: 17 mg/dL — AB (ref >40)
LDL Cholesterol: 97 mg/dL (ref 0–99)
Total CHOL/HDL Ratio: 7.8 RATIO
Triglycerides: 96 mg/dL (ref <150)
VLDL: 19 mg/dL (ref 0–40)

## 2017-08-14 MED ORDER — METOPROLOL SUCCINATE ER 25 MG PO TB24
12.5000 mg | ORAL_TABLET | Freq: Every day | ORAL | Status: DC
  Administered 2017-08-15: 08:00:00 12.2 mg via ORAL
  Filled 2017-08-14: qty 1

## 2017-08-14 NOTE — Care Management Obs Status (Signed)
[facility] NOTIFICATION   Patient Details  Name: Mercedes Rodriguez MRN: [id number] Date of Birth: 04-25-1929   Medicare Observation Status Notification Given:  Yes    Shelbie Ammons, RN 08/14/2017, 9:10 AM

## 2017-08-14 NOTE — Evaluation (Signed)
Clinical/Bedside Swallow Evaluation Patient Details  Name: Mercedes Rodriguez MRN: [id number] Date of Birth: 10-25-1929  Today's Date: 08/14/2017 Time: SLP Start Time (ACUTE ONLY): 7654 SLP Stop Time (ACUTE ONLY): 1701 SLP Time Calculation (min) (ACUTE ONLY): 41 min  Past Medical History:  Past Medical History:  Diagnosis Date  . Arrhythmia   . Atrial fibrillation ([place])   . CHF (congestive heart failure) ([place])   . Hearing loss   . Hypertension    Past Surgical History:  Past Surgical History:  Procedure Laterality Date  . COLONOSCOPY N/A 04/12/2017   Procedure: COLONOSCOPY;  Surgeon: Toledo, Benay Pike, MD;  Location: [facility] ENDOSCOPY;  Service: Gastroenterology;  Laterality: N/A;  . ESOPHAGOGASTRODUODENOSCOPY N/A 04/12/2017   Procedure: ESOPHAGOGASTRODUODENOSCOPY (EGD);  Surgeon: Toledo, Benay Pike, MD;  Location: [facility] ENDOSCOPY;  Service: Gastroenterology;  Laterality: N/A;   HPI:  Per admitting H& P: Mercedes Rodriguez  is a 84 y.o. female with a known history of atrial fibrillation on Xarelto, essential hypertension called EMS this morning, by the time EMS and patient appeared confused and does not know why she called EMS.  And being evaluated in the emergency room, we are asked to admit for evaluation of possible stroke.  Patient is alert, hard of hearing and tells me that she does not know why she called EMS.  Also appears to have expressive aphasia trying to tell something and she forgets what she wants to tell.  No obvious weakness of hands or legs.  Speech is clear, no facial droop.  Patient lives at home by herself.  Admitted on July 18 th for right thigh cellulitis.   Assessment / Plan / Recommendation Clinical Impression   Patient presents with functional swallowing abilities at bedside, no s/s aspiration noted with any consistency tested.  Oral phase WFL, A-P transit time [facility], adequate oral prep of solid, puree and thin liquid boluses. No oral residue noted. Noted timely initiation of  swallow. Vocal quality remained clear throughout evaluation. Patient easily tolerated multiple sips thin liquid with no s/s aspiration, and demonstrated functional labial seal. Oral mech exam revealed mild left facial droop and weakness, mild L>R lingual weakness, and generalized weakness bilaterally of all oral motor structures. Recommend continue with current regular diet with thin liquid. Recommend single sips, NO STRAWS, small bites to decrease risk of aspiration. Recommend strict aspiration precautions and supervision with meals due to marked decreased safety awareness and cognitive deficits. Recommend meds be given whole in puree. Screened speech and language. No family available to determine baseline functioning. Noted severe receptive and expressive aphasia c/b severe word finding difficulties, auditory comprehension deficits with simple y/n questions, difficulty following directions without visual cues. Verbal repetition intact. Patient could not report her last name and is oriented x1 (self) only. No evidence of dysarthria or apraxia. Recommend further SLP evaluation of speech and language function. SLP to f/u with toleration of diet and f/u with family re: baseline functioning. SLP Visit Diagnosis: Dysphagia, unspecified (R13.10)    Aspiration Risk    Mild   Diet Recommendation Regular;Thin liquid   Liquid Administration via: Cup Medication Administration: Whole meds with puree Supervision: Intermittent supervision to cue for compensatory strategies Compensations: Minimize environmental distractions;Slow rate;Small sips/bites Postural Changes: Seated upright at 90 degrees;Remain upright for at least 30 minutes after po intake    Other  Recommendations Oral Care Recommendations: Oral care BID   Follow up Recommendations Skilled Nursing facility      Frequency and Duration min 1 x/week  1 week  Prognosis Prognosis for Safe Diet Advancement: Good      Swallow Study   General  Date of Onset: 08/14/17 HPI: Per admitting H& P: Mercedes Rodriguez  is a 84 y.o. female with a known history of atrial fibrillation on Xarelto, essential hypertension called EMS this morning, by the time EMS and patient appeared confused and does not know why she called EMS.  And being evaluated in the emergency room, we are asked to admit for evaluation of possible stroke.  Patient is alert, hard of hearing and tells me that she does not know why she called EMS.  Also appears to have expressive aphasia trying to tell something and she forgets what she wants to tell.  No obvious weakness of hands or legs.  Speech is clear, no facial droop.  Patient lives at home by herself.  Admitted on July 18 th for right thigh cellulitis. Diet Prior to this Study: Regular;Thin liquids Behavior/Cognition: Alert;Cooperative;Pleasant mood Oral Cavity Assessment: Within Functional Limits Oral Cavity - Dentition: Dentures, top;Dentures, bottom Patient Positioning: Upright in bed Baseline Vocal Quality: Normal    Oral/Motor/Sensory Function Overall Oral Motor/Sensory Function: Mild impairment Facial ROM: Reduced left Facial Symmetry: Abnormal symmetry left Facial Strength: Reduced right;Reduced left Facial Sensation: (CNT, pt does not have y/n reliability) Lingual ROM: Reduced right;Reduced left Lingual Symmetry: Abnormal symmetry left(mild) Lingual Strength: Reduced Mandible: Within Functional Limits   Ice Chips Ice chips: Within functional limits Presentation: Spoon   Thin Liquid Thin Liquid: Within functional limits Presentation: Cup;Self Fed;Straw    Nectar Thick     Honey Thick     Puree Puree: Within functional limits Presentation: Spoon   Solid     Solid: Within functional limits Presentation: [place], MA, CCC-SLP 08/14/2017,5:22 PM

## 2017-08-14 NOTE — Plan of Care (Signed)
  Problem: Education: Goal: Knowledge of General Education information will improve Description Including pain rating scale, medication(s)/side effects and non-pharmacologic comfort measures Outcome: Progressing   

## 2017-08-14 NOTE — Evaluation (Signed)
Physical Therapy Evaluation Patient Details Name: Mercedes Rodriguez MRN: [id number] DOB: 05-09-1929 Today's Date: 08/14/2017   History of Present Illness  Patient is 84 yo female that presented to the ED via EMS with AMS, admitted for stroke workup. 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 Patient extremely hard of hearing, unclear whether it is confusion/communication issues. Family at beside to help confirm PLOF. Patient lives in 1 story home with son who is available intermittently, and granddaughters who can check in. Family reports patient ambulates with walker, and occasional goes grocery shopping and uses scooter. Family reports patient uses O2 at home.   The patient demonstrated bed mobility with minAx1, transferred with CGA and RW, and ambulated in room ~35ft with RW and CGA. Patient needed verbal/visual cues to enhance safety during transfers. Patient complained of fatigue at end of mobility. Overall the patient demonstrates a decrease in mobility compared to PLOF as well as deficits in strength, endurance, and activity tolerance. The patient would benefit from further skilled PT to address these limitations, improve mobility, increase safety, and maximize independence. Current recommendation is SNF due to level of assistance needed and decreased caregiver support at home.    Follow Up Recommendations SNF    Equipment Recommendations  Rolling walker with 5" wheels    Recommendations for Other Services       Precautions / Restrictions Precautions Precautions: Fall Restrictions Weight Bearing Restrictions: No      Mobility  Bed Mobility Overal bed mobility: Needs Assistance Bed Mobility: Supine to Sit     Supine to sit: Min assist      General bed mobility comments: for LLE management and trunk elevation, modAx1 to scoot to EOB  Transfers Overall transfer level: Needs assistance Equipment used: Rolling walker (2 wheeled) Transfers: Sit to/from Stand Sit to Stand: Min guard            Ambulation/Gait Ambulation/Gait assistance: Min guard Gait Distance (Feet): 15 Feet Assistive device: Rolling walker (2 wheeled)       General Gait Details: decreased speed, crouched gait  Stairs            Wheelchair Mobility    Modified Rankin (Stroke Patients Only)       Balance Overall balance assessment: Needs assistance Sitting-balance support: Feet supported;Bilateral upper extremity supported Sitting balance-Leahy Scale: Fair       Standing balance-Leahy Scale: Poor                               Pertinent Vitals/Pain Pain Assessment: No/denies pain    Home Living Family/patient expects to be discharged to:: Private residence Living Arrangements: Children Available Help at Discharge: Family;Available PRN/intermittently Type of Home: House Home Access: Stairs to enter Entrance Stairs-Rails: None Entrance Stairs-Number of Steps: 3 Home Layout: One level Home Equipment: Grab bars - tub/shower Additional Comments: Per family patient has walker at home, unable to recall if RW or 4WW. Patient demonstrated some confusion with history questions, extremely hard of hearing.     Prior Function Level of Independence: Independent with assistive device(s)         Comments: Reports using RW around the house      Hand  Dominance   Dominant Hand: Right    Extremity/Trunk Assessment   Upper Extremity Assessment Upper Extremity Assessment: Generalized weakness;Defer to OT evaluation    Lower Extremity Assessment Lower Extremity Assessment: Generalized weakness;RLE deficits/detail;LLE deficits/detail RLE Deficits / Details: 3+/5 grossly LLE Deficits / Details: 3+/5 grossly        Communication   Communication: HOH  Cognition Arousal/Alertness: Awake/alert Behavior During Therapy: WFL for tasks assessed/performed Overall Cognitive Status: Within Functional Limits for tasks assessed                                        General Comments      Exercises     Assessment/Plan    PT Assessment Patient needs continued PT services  PT Problem List Decreased strength;Decreased range of motion;Decreased activity tolerance;Decreased safety awareness;Decreased balance       PT Treatment Interventions DME instruction;Balance training;Gait training;Neuromuscular re-education;Stair training;Functional mobility training;Patient/family education;Therapeutic activities;Therapeutic exercise    PT Goals (Current goals can be found in the Care Plan section)  Acute Rehab PT Goals Patient Stated Goal: Patient would like to get up PT Goal Formulation: With patient Time For Goal Achievement: 08/28/17 Potential to Achieve Goals: Good    Frequency Min 2X/week   Barriers to discharge Decreased caregiver support;Inaccessible home environment      Co-evaluation               AM-PAC PT "6 Clicks" Daily Activity  Outcome Measure Difficulty turning over in bed (including adjusting bedclothes, sheets and blankets)?: A Little Difficulty moving from lying on back to sitting on the side of the bed? : Unable Difficulty sitting down on and standing up from a chair with arms (e.g., wheelchair, bedside commode, etc,.)?: Unable Help needed moving to and from a bed to chair (including a wheelchair)?: A Little Help needed walking in hospital room?: A Little Help needed climbing 3-5 steps with a railing? : Total 6 Click Score: 12    End of Session Equipment Utilized During Treatment: Gait belt Activity Tolerance: Patient tolerated treatment well Patient left: with chair alarm set;in chair;with nursing/sitter in room;with family/visitor present(heels  elevated) Nurse Communication: Mobility status PT Visit Diagnosis: Unsteadiness on feet (R26.81);Other abnormalities of gait and mobility (R26.89);Muscle weakness (generalized) (M62.81)    Time: 1121-1200 PT Time Calculation (min) (ACUTE ONLY): 39 min   Charges:   PT Evaluation $PT Eval Low Complexity: 1 Low PT Treatments $Therapeutic Activity: 23-37 mins        Lieutenant Diego PT, DPT 12:22 PM,08/14/17 [phone]

## 2017-08-14 NOTE — Progress Notes (Addendum)
[place] [facility] [place] NAME: Mercedes Rodriguez    MR#:  [id number]  DATE OF BIRTH:  08/09/1929  SUBJECTIVE:   patient with confusion this morning. Family at bedside and apparently patient does not have confusion at baseline she also has some difficulty with her word  REVIEW OF SYSTEMS:    Able to obtain  Tolerating Diet: yes      DRUG ALLERGIES:  No Known Allergies  VITALS:  Blood pressure (!) 98/52, pulse 91, temperature 98.6 F (37 C), temperature source Oral, resp. rate 18, height 5' (1.524 m), weight 72.6 kg, SpO2 94 %.  PHYSICAL EXAMINATION:  Constitutional: Appears frail no distress. HENT: Normocephalic. Marland Kitchen Oropharynx is clear and moist.  Eyes: Conjunctivae and EOM are normal. PERRLA, no scleral icterus.  Neck: Normal ROM. Neck supple. No JVD. No tracheal deviation. CVS: Irregular, irregular S1/S2 +, no murmurs, no gallops, no carotid bruit.  Pulmonary: Effort and breath sounds normal, no stridor, rhonchi, wheezes, rales.  Abdominal: Soft. BS +,  no distension, tenderness, rebound or guarding.  Musculoskeletal: Normal range of motion. No edema and no tenderness.  Neuro: Alert.  She is hard of hearing she follows only simple commands Skin: Skin is warm and dry. No rash noted. Psychiatric: Confusion and difficulty word finding   LABORATORY PANEL:   CBC Recent Labs  Lab 08/13/17 0915  WBC 13.3*  HGB 10.9*  HCT 34.7*  PLT 313   ------------------------------------------------------------------------------------------------------------------  Chemistries  Recent Labs  Lab 08/13/17 0915  NA 139  K 4.2  CL 103  CO2 27  GLUCOSE 105*  BUN 26*  CREATININE 1.55*  CALCIUM 8.8*  AST 25  ALT 10  ALKPHOS 78  BILITOT 0.8   ------------------------------------------------------------------------------------------------------------------  Cardiac Enzymes Recent Labs  Lab 08/13/17 0915  TROPONINI <0.03    ------------------------------------------------------------------------------------------------------------------  RADIOLOGY:  Dg Chest 1 View  Result Date: 08/13/2017 CLINICAL DATA:  Shortness of Breath EXAM: CHEST  1 VIEW COMPARISON:  04/09/2017 FINDINGS: Cardiac shadow is enlarged. The lungs are well aerated bilaterally. No focal infiltrate or sizable effusion is seen. No bony abnormality is noted. IMPRESSION: No acute abnormality seen. Electronically Signed   By: Inez Catalina M.D.   On: 08/13/2017 10:10   Ct Head Wo Contrast  Result Date: 08/13/2017 CLINICAL DATA:  New onset confusion beginning today. EXAM: CT HEAD WITHOUT CONTRAST TECHNIQUE: Contiguous axial images were obtained from the base of the skull through the vertex without intravenous contrast. COMPARISON:  None. FINDINGS: Brain: Generalized brain atrophy with some temporal predominance. Chronic small-vessel ischemic changes of the hemispheric white matter. No sign of acute infarction, mass lesion, hemorrhage, hydrocephalus or extra-axial collection. Vascular: There is atherosclerotic calcification of the major vessels at the base of the brain. Skull: Normal Sinuses/Orbits: Clear/normal Other: None IMPRESSION: No acute finding by CT. Brain atrophy with some temporal lobe predominance. Chronic small-vessel ischemic changes throughout the cerebral hemispheric white matter. Electronically Signed   By: Nelson Chimes M.D.   On: 08/13/2017 11:09   Mr Brain Wo Contrast  Result Date: 08/14/2017 CLINICAL DATA:  Stroke follow-up.  Confusion.  Atrial fibrillation. EXAM: MRI HEAD WITHOUT CONTRAST MRA HEAD WITHOUT CONTRAST TECHNIQUE: Multiplanar, multiecho pulse sequences of the brain and surrounding structures were obtained without intravenous contrast. Angiographic images of the head were obtained using MRA technique without contrast. COMPARISON:  Head CT 08/13/2017 FINDINGS: The study is degraded by motion, despite efforts to reduce this artifact,  including utilization of motion-resistant MR sequences. The  findings of the study are interpreted in the context of reduced sensitivity/specificity. MRI HEAD FINDINGS BRAIN: The midline structures are normal. There is no acute infarct, acute hemorrhage or mass. Early confluent hyperintense T2-weighted signal of the periventricular and deep white matter, most commonly due to chronic ischemic microangiopathy. Generalized atrophy without lobar predilection. Blood-sensitive sequences show no chronic microhemorrhage or superficial siderosis. SKULL AND UPPER CERVICAL SPINE: The visualized skull base, calvarium, upper cervical spine and extracranial soft tissues are normal. SINUSES/ORBITS: No fluid levels or advanced mucosal thickening. No mastoid or middle ear effusion. The orbits are normal. MRA HEAD FINDINGS Intracranial internal carotid arteries: Short segment loss of flow related enhancement of the cavernous segment left internal carotid artery, possibly a motion artifact. Anterior cerebral arteries: Normal. Middle cerebral arteries: Normal. Posterior communicating arteries: Absent bilaterally. Posterior cerebral arteries: Normal. Basilar artery: Normal. Vertebral arteries: Left dominant. Normal. Superior cerebellar arteries: Normal. Anterior inferior cerebellar arteries: Normal. Posterior inferior cerebellar arteries: Not clearly visualized. IMPRESSION: 1. Motion degraded examination, lowering sensitivity and specificity. 2. No acute intracranial abnormality. 3. Chronic ischemic microangiopathy and generalized atrophy. 4. Short segment loss of flow related enhancement of the left internal carotid artery cavernous segment may be a motion artifact. Otherwise, no proximal occlusion or high-grade stenosis. Consider correlation with CT angiography. Electronically Signed   By: Ulyses Jarred M.D.   On: 08/14/2017 01:24   Mr Jodene Nam Head/brain JG Cm  Result Date: 08/14/2017 CLINICAL DATA:  Stroke follow-up.  Confusion.   Atrial fibrillation. EXAM: MRI HEAD WITHOUT CONTRAST MRA HEAD WITHOUT CONTRAST TECHNIQUE: Multiplanar, multiecho pulse sequences of the brain and surrounding structures were obtained without intravenous contrast. Angiographic images of the head were obtained using MRA technique without contrast. COMPARISON:  Head CT 08/13/2017 FINDINGS: The study is degraded by motion, despite efforts to reduce this artifact, including utilization of motion-resistant MR sequences. The findings of the study are interpreted in the context of reduced sensitivity/specificity. MRI HEAD FINDINGS BRAIN: The midline structures are normal. There is no acute infarct, acute hemorrhage or mass. Early confluent hyperintense T2-weighted signal of the periventricular and deep white matter, most commonly due to chronic ischemic microangiopathy. Generalized atrophy without lobar predilection. Blood-sensitive sequences show no chronic microhemorrhage or superficial siderosis. SKULL AND UPPER CERVICAL SPINE: The visualized skull base, calvarium, upper cervical spine and extracranial soft tissues are normal. SINUSES/ORBITS: No fluid levels or advanced mucosal thickening. No mastoid or middle ear effusion. The orbits are normal. MRA HEAD FINDINGS Intracranial internal carotid arteries: Short segment loss of flow related enhancement of the cavernous segment left internal carotid artery, possibly a motion artifact. Anterior cerebral arteries: Normal. Middle cerebral arteries: Normal. Posterior communicating arteries: Absent bilaterally. Posterior cerebral arteries: Normal. Basilar artery: Normal. Vertebral arteries: Left dominant. Normal. Superior cerebellar arteries: Normal. Anterior inferior cerebellar arteries: Normal. Posterior inferior cerebellar arteries: Not clearly visualized. IMPRESSION: 1. Motion degraded examination, lowering sensitivity and specificity. 2. No acute intracranial abnormality. 3. Chronic ischemic microangiopathy and generalized  atrophy. 4. Short segment loss of flow related enhancement of the left internal carotid artery cavernous segment may be a motion artifact. Otherwise, no proximal occlusion or high-grade stenosis. Consider correlation with CT angiography. Electronically Signed   By: Ulyses Jarred M.D.   On: 08/14/2017 01:24     ASSESSMENT AND PLAN:   84 year old female with a history of persistent atrial fibrillation on Xarelto who presents with confusion and difficulty finding words.  1.  Acute encephalopathy: I suspect this may be due to acute CVA not seen on  MRI UA and chest x-ray showed no signs of infection Follow-up on echocardiogram and carotid Doppler Follow-up in neurology consultation Check TSH  2.  Persistent atrial fibrillation: Continue Xarelto and I will lower dose of metoprolol due to low blood pressure.  3.  Essential hypertension: I will decrease dose of metoprolol      Management plans discussed with the patient's family and she is in agreement.  CODE STATUS: DNR  TOTAL TIME TAKING CARE OF THIS PATIENT: 28 minutes.     POSSIBLE D/C tomorrow, DEPENDING ON CLINICAL CONDITION.   Peg Fifer M.D on 08/14/2017 at 10:23 AM  Between 7am to 6pm - Pager - [phone] After 6pm go to [email or web address] - password [email or web address] [facility]  Office  [phone]  CC: Primary care physician; Lynnell Jude, MD  Note: This dictation was prepared with Dragon dictation along with smaller phrase technology. Any transcriptional errors that result from this process are unintentional.

## 2017-08-14 NOTE — Clinical Social Work Note (Signed)
CSW spoke with Liliane Channel at [facility] regarding patient. He states that they would be able to take patient again but she has a debt of $370 that would have to be paid first. CSW left a voicemail for patient's granddaughter Benjamine Mola [phone] with this information. Waiting to hear back from family as to what they would like to do. CSW will continue to follow for discharge planning.   [place], [place]

## 2017-08-14 NOTE — Clinical Social Work Note (Signed)
CSW spoke with patient's granddaughter Benjamine Mola and she states that they will pay the overdue bill today or tomorrow. She would like patient to go to [facility] at discharge. CSW has initiated [place] through [facility]. CSW will continue to follow for discharge planning.   [place], [place]

## 2017-08-14 NOTE — Progress Notes (Signed)
*  PRELIMINARY RESULTS* Echocardiogram 2D Echocardiogram has been performed.  Sherrie Sport 08/14/2017, 8:54 AM

## 2017-08-14 NOTE — Consult Note (Signed)
Reason for Consult:AMS Referring Physician: Mody  CC: AMS  HPI: Mercedes Rodriguez is an 84 y.o. female with a known history of atrial fibrillation on Xarelto, essential hypertension called EMS on 8/8.  When EMS arrived, patient appeared confused and did not know why she called EMS. Concern was for possible stroke.  There was some question of aphasia in ED. Sister at bedside today and reports patient is at baseline.     Past Medical History:  Diagnosis Date  . Arrhythmia   . Atrial fibrillation ([place])   . CHF (congestive heart failure) ([place])   . Hearing loss   . Hypertension     Past Surgical History:  Procedure Laterality Date  . COLONOSCOPY N/A 04/12/2017   Procedure: COLONOSCOPY;  Surgeon: Toledo, Benay Pike, MD;  Location: [facility] ENDOSCOPY;  Service: Gastroenterology;  Laterality: N/A;  . ESOPHAGOGASTRODUODENOSCOPY N/A 04/12/2017   Procedure: ESOPHAGOGASTRODUODENOSCOPY (EGD);  Surgeon: Toledo, Benay Pike, MD;  Location: [facility] ENDOSCOPY;  Service: Gastroenterology;  Laterality: N/A;    Family History  Problem Relation Age of Onset  . Breast cancer Mother   . Hyperlipidemia Father   . Lung cancer Brother   . Heart attack Brother   . Heart Problems Brother     Social History:  reports that she quit smoking about 24 years ago. Her smoking use included cigarettes. She has never used smokeless tobacco. She reports that she does not drink alcohol or use drugs.  No Known Allergies  Medications:  I have reviewed the patient's current medications. Prior to Admission:  Medications Prior to Admission  Medication Sig Dispense Refill Last Dose  . ferrous sulfate 325 (65 FE) MG EC tablet Take 1 tablet (325 mg total) by mouth 2 (two) times daily with a meal. 60 tablet 3 08/13/2017 at Unknown time  . furosemide (LASIX) 40 MG tablet Take 0.5 tablets (20 mg total) by mouth daily.   08/13/2017 at Unknown time  . metoprolol succinate (TOPROL-XL) 100 MG 24 hr tablet Take 100 mg by mouth daily. Take with or  immediately following a meal.   08/13/2017 at Unknown time  . potassium chloride SA (K-DUR,KLOR-CON) 20 MEQ tablet Take 1 tablet (20 mEq total) by mouth daily.   08/13/2017 at Unknown time  . Rivaroxaban (XARELTO) 15 MG TABS tablet Take 1 tablet (15 mg total) by mouth daily with supper. 30 tablet 0 08/13/2017 at Unknown time  . traZODone (DESYREL) 100 MG tablet Take 1 tablet (100 mg total) by mouth at bedtime as needed for sleep. (Patient taking differently: Take 100 mg by mouth at bedtime. ) 30 tablet 0 08/12/2017 at Unknown time  . acetaminophen (TYLENOL) 325 MG tablet Take 2 tablets (650 mg total) by mouth every 6 (six) hours as needed for mild pain (or Fever >/= 101).   PRN at PRN  . docusate sodium (COLACE) 100 MG capsule Take 1 capsule (100 mg total) by mouth daily as needed for mild constipation or moderate constipation. Do not take if you have loose stools 60 capsule 2 PRN at PRN  . ranitidine (ZANTAC) 150 MG tablet Take 150 mg by mouth 2 (two) times daily as needed for heartburn.   PRN at PRN  . triamcinolone cream (KENALOG) 0.1 % Apply 1 application topically 2 (two) times daily.   PRN at PRN   Scheduled: . aspirin  81 mg Oral Daily  . [START ON 08/15/2017] metoprolol succinate  12.5 mg Oral Daily  . Rivaroxaban  15 mg Oral Q supper  ROS: History obtained from the patient  General ROS: negative for - chills, fatigue, fever, night sweats, weight gain or weight loss Psychological ROS: negative for - behavioral disorder, hallucinations, memory difficulties, mood swings or suicidal ideation Ophthalmic ROS: negative for - blurry vision, double vision, eye pain or loss of vision ENT ROS: HOH Allergy and Immunology ROS: negative for - hives or itchy/watery eyes Hematological and Lymphatic ROS: negative for - bleeding problems, bruising or swollen lymph nodes Endocrine ROS: negative for - galactorrhea, hair pattern changes, polydipsia/polyuria or temperature intolerance Respiratory ROS: negative  for - cough, hemoptysis, shortness of breath or wheezing Cardiovascular ROS: negative for - chest pain, dyspnea on exertion, edema or irregular heartbeat Gastrointestinal ROS: negative for - abdominal pain, diarrhea, hematemesis, nausea/vomiting or stool incontinence Genito-Urinary ROS: negative for - dysuria, hematuria, incontinence or urinary frequency/urgency Musculoskeletal ROS: negative for - joint swelling or muscular weakness Neurological ROS: as noted in HPI Dermatological ROS: negative for rash and skin lesion changes  Physical Examination: Blood pressure 135/80, pulse (!) 121, temperature 98.1 F (36.7 C), temperature source Oral, resp. rate 20, height 5' (1.524 m), weight 72.6 kg, SpO2 100 %.  HEENT-  Normocephalic, no lesions, without obvious abnormality.  Normal external eye and conjunctiva.  Normal TM's bilaterally.  Normal auditory canals and external ears. Normal external nose, mucus membranes and septum.  Normal pharynx. Cardiovascular- S1, S2 normal, pulses palpable throughout   Lungs- chest clear, no wheezing, rales, normal symmetric air entry Abdomen- soft, non-tender; bowel sounds normal; no masses,  no organomegaly Extremities- no edema Lymph-no adenopathy palpable Musculoskeletal-no joint tenderness, deformity or swelling Skin-warm and dry, no hyperpigmentation, vitiligo, or suspicious lesions  Neurological Examination   Mental Status: Alert, unable to tell me the month or year.  Knows month she was born in.  Speech fluent without evidence of aphasia.  Able to follow 3 step commands but needs some reinforcement. Cranial Nerves: II: Discs flat bilaterally; Visual fields grossly normal, pupils equal, round, reactive to light and accommodation III,IV, VI: mild right ptosis, extra-ocular motions intact bilaterally V,VII: smile symmetric, facial light touch sensation normal bilaterally VIII: hearing normal bilaterally IX,X: gag reflex present XI: bilateral shoulder  shrug XII: midline tongue extension Motor: Right : Upper extremity   5/5    Left:     Upper extremity   5/5  Lower extremity   5/5     Lower extremity   5/5 Tone and bulk:normal tone throughout; no atrophy noted Sensory: Pinprick and light touch intact throughout, bilaterally Deep Tendon Reflexes: 2+ with absent LE's DTR's Plantars: Right: downgoing   Left: downgoing Cerebellar: Normal finger-to-nose and normal heel-to-shin testing bilaterally Gait: not tested due to safety concerns    Laboratory Studies:   Basic Metabolic Panel: Recent Labs  Lab 08/13/17 0915  NA 139  K 4.2  CL 103  CO2 27  GLUCOSE 105*  BUN 26*  CREATININE 1.55*  CALCIUM 8.8*    Liver Function Tests: Recent Labs  Lab 08/13/17 0915  AST 25  ALT 10  ALKPHOS 78  BILITOT 0.8  PROT 7.2  ALBUMIN 2.9*   No results for input(s): LIPASE, AMYLASE in the last 168 hours. No results for input(s): AMMONIA in the last 168 hours.  CBC: Recent Labs  Lab 08/13/17 0915  WBC 13.3*  NEUTROABS 10.7*  HGB 10.9*  HCT 34.7*  MCV 81.1  PLT 313    Cardiac Enzymes: Recent Labs  Lab 08/13/17 0915  TROPONINI <0.03    BNP: Invalid  input(s): POCBNP  CBG: No results for input(s): GLUCAP in the last 168 hours.  Microbiology: Results for orders placed or performed during the hospital encounter of 07/22/17  Culture, blood (routine x 2)     Status: None   Collection Time: 07/23/17 12:11 AM  Result Value Ref Range Status   Specimen Description BLOOD RIGHT [facility]  Final   Special Requests   Final    BOTTLES DRAWN AEROBIC AND ANAEROBIC Blood Culture results may not be optimal due to an excessive volume of blood received in culture bottles   Culture   Final    NO GROWTH 5 DAYS Performed at [facility], [address], [place], [place] [address]    Report Status 07/28/2017 FINAL  Final  Culture, blood (routine x 2)     Status: None   Collection Time: 07/23/17  2:41 AM  Result Value Ref Range Status    Specimen Description BLOOD RIGHT ANTECUBITAL  Final   Special Requests   Final    BOTTLES DRAWN AEROBIC AND ANAEROBIC Blood Culture adequate volume   Culture   Final    NO GROWTH 5 DAYS Performed at [facility], [address] [place], [place] [address]    Report Status 07/28/2017 FINAL  Final    Coagulation Studies: No results for input(s): LABPROT, INR in the last 72 hours.  Urinalysis:  Recent Labs  Lab 08/13/17 0917  COLORURINE YELLOW*  LABSPEC 1.011  PHURINE 5.0  GLUCOSEU NEGATIVE  HGBUR NEGATIVE  BILIRUBINUR NEGATIVE  KETONESUR 5*  PROTEINUR NEGATIVE  NITRITE NEGATIVE  LEUKOCYTESUR NEGATIVE    Lipid Panel:     Component Value Date/Time   CHOL 133 08/14/2017 0449   TRIG 96 08/14/2017 0449   HDL 17 (L) 08/14/2017 0449   CHOLHDL 7.8 08/14/2017 0449   VLDL 19 08/14/2017 0449   LDLCALC 97 08/14/2017 0449    HgbA1C:  Lab Results  Component Value Date   HGBA1C 5.4 08/14/2017    Urine Drug Screen:  No results found for: LABOPIA, COCAINSCRNUR, LABBENZ, AMPHETMU, THCU, LABBARB  Alcohol Level: No results for input(s): ETH in the last 168 hours.  Other results: EKG: atrial fibrillation, rate 105 bpm.  Imaging: Dg Chest 1 View  Result Date: 08/13/2017 CLINICAL DATA:  Shortness of Breath EXAM: CHEST  1 VIEW COMPARISON:  04/09/2017 FINDINGS: Cardiac shadow is enlarged. The lungs are well aerated bilaterally. No focal infiltrate or sizable effusion is seen. No bony abnormality is noted. IMPRESSION: No acute abnormality seen. Electronically Signed   By: Inez Catalina M.D.   On: 08/13/2017 10:10   Ct Head Wo Contrast  Result Date: 08/13/2017 CLINICAL DATA:  New onset confusion beginning today. EXAM: CT HEAD WITHOUT CONTRAST TECHNIQUE: Contiguous axial images were obtained from the base of the skull through the vertex without intravenous contrast. COMPARISON:  None. FINDINGS: Brain: Generalized brain atrophy with some temporal predominance. Chronic  small-vessel ischemic changes of the hemispheric white matter. No sign of acute infarction, mass lesion, hemorrhage, hydrocephalus or extra-axial collection. Vascular: There is atherosclerotic calcification of the major vessels at the base of the brain. Skull: Normal Sinuses/Orbits: Clear/normal Other: None IMPRESSION: No acute finding by CT. Brain atrophy with some temporal lobe predominance. Chronic small-vessel ischemic changes throughout the cerebral hemispheric white matter. Electronically Signed   By: Nelson Chimes M.D.   On: 08/13/2017 11:09   Mr Brain Wo Contrast  Result Date: 08/14/2017 CLINICAL DATA:  Stroke follow-up.  Confusion.  Atrial fibrillation. EXAM: MRI HEAD  WITHOUT CONTRAST MRA HEAD WITHOUT CONTRAST TECHNIQUE: Multiplanar, multiecho pulse sequences of the brain and surrounding structures were obtained without intravenous contrast. Angiographic images of the head were obtained using MRA technique without contrast. COMPARISON:  Head CT 08/13/2017 FINDINGS: The study is degraded by motion, despite efforts to reduce this artifact, including utilization of motion-resistant MR sequences. The findings of the study are interpreted in the context of reduced sensitivity/specificity. MRI HEAD FINDINGS BRAIN: The midline structures are normal. There is no acute infarct, acute hemorrhage or mass. Early confluent hyperintense T2-weighted signal of the periventricular and deep white matter, most commonly due to chronic ischemic microangiopathy. Generalized atrophy without lobar predilection. Blood-sensitive sequences show no chronic microhemorrhage or superficial siderosis. SKULL AND UPPER CERVICAL SPINE: The visualized skull base, calvarium, upper cervical spine and extracranial soft tissues are normal. SINUSES/ORBITS: No fluid levels or advanced mucosal thickening. No mastoid or middle ear effusion. The orbits are normal. MRA HEAD FINDINGS Intracranial internal carotid arteries: Short segment loss of flow  related enhancement of the cavernous segment left internal carotid artery, possibly a motion artifact. Anterior cerebral arteries: Normal. Middle cerebral arteries: Normal. Posterior communicating arteries: Absent bilaterally. Posterior cerebral arteries: Normal. Basilar artery: Normal. Vertebral arteries: Left dominant. Normal. Superior cerebellar arteries: Normal. Anterior inferior cerebellar arteries: Normal. Posterior inferior cerebellar arteries: Not clearly visualized. IMPRESSION: 1. Motion degraded examination, lowering sensitivity and specificity. 2. No acute intracranial abnormality. 3. Chronic ischemic microangiopathy and generalized atrophy. 4. Short segment loss of flow related enhancement of the left internal carotid artery cavernous segment may be a motion artifact. Otherwise, no proximal occlusion or high-grade stenosis. Consider correlation with CT angiography. Electronically Signed   By: Ulyses Jarred M.D.   On: 08/14/2017 01:24   US Carotid Bilateral (at [facility] And [facility] Only)  Result Date: 08/14/2017 CLINICAL DATA:  TIA. History of hypertension. Former smoker. History of atrial fibrillation. EXAM: BILATERAL CAROTID DUPLEX ULTRASOUND TECHNIQUE: Pearline Cables scale imaging, color Doppler and duplex ultrasound were performed of bilateral carotid and vertebral arteries in the neck. COMPARISON:  None. FINDINGS: Criteria: Quantification of carotid stenosis is based on velocity parameters that correlate the residual internal carotid diameter with NASCET-based stenosis levels, using the diameter of the distal internal carotid lumen as the denominator for stenosis measurement. The following velocity measurements were obtained: RIGHT ICA:  63/22 cm/sec CCA:  751/02 cm/sec SYSTOLIC ICA/CCA RATIO:  1.3 ECA:  72 cm/sec LEFT ICA:  119/37 cm/sec CCA:  58/52 cm/sec SYSTOLIC ICA/CCA RATIO:  2.0 ECA:  99 cm/sec RIGHT CAROTID ARTERY: There is a minimal to moderate amount of mixed echogenic plaque within the right carotid bulb  (image 16), extending to involve the origin and proximal aspects of the right internal carotid artery (image 24), not resulting in elevated peak systolic velocities within the interrogated course of the right internal carotid artery to suggest a hemodynamically significant stenosis. RIGHT VERTEBRAL ARTERY:  Antegrade flow LEFT CAROTID ARTERY: There is a moderate to large amount of echogenic plaque within the left carotid bulb (image 50) extending to involve the origin and proximal aspects of the left internal carotid artery (image 58) morphologically resulting in 50% luminal narrowing with borderline elevated peak systolic velocities within the left internal carotid artery. Greatest acquired peak systolic velocity within the left internal carotid artery measures 119 centimeters/second (image 63). LEFT VERTEBRAL ARTERY:  Antegrade Flow IMPRESSION: 1. Large amount of left-sided atherosclerotic plaque morphologically results in 50% luminal narrowing and is associated with borderline elevated peak systolic velocities which approach the 50-69% luminal  narrowing range. Further evaluation with CTA could be performed as clinically indicated. 2. Minimal to moderate amount of right-sided atherosclerotic plaque, not resulting in a hemodynamically significant stenosis. Electronically Signed   By: Sandi Mariscal M.D.   On: 08/14/2017 10:34   Mr Jodene Nam Head/brain WL Cm  Result Date: 08/14/2017 CLINICAL DATA:  Stroke follow-up.  Confusion.  Atrial fibrillation. EXAM: MRI HEAD WITHOUT CONTRAST MRA HEAD WITHOUT CONTRAST TECHNIQUE: Multiplanar, multiecho pulse sequences of the brain and surrounding structures were obtained without intravenous contrast. Angiographic images of the head were obtained using MRA technique without contrast. COMPARISON:  Head CT 08/13/2017 FINDINGS: The study is degraded by motion, despite efforts to reduce this artifact, including utilization of motion-resistant MR sequences. The findings of the study are  interpreted in the context of reduced sensitivity/specificity. MRI HEAD FINDINGS BRAIN: The midline structures are normal. There is no acute infarct, acute hemorrhage or mass. Early confluent hyperintense T2-weighted signal of the periventricular and deep white matter, most commonly due to chronic ischemic microangiopathy. Generalized atrophy without lobar predilection. Blood-sensitive sequences show no chronic microhemorrhage or superficial siderosis. SKULL AND UPPER CERVICAL SPINE: The visualized skull base, calvarium, upper cervical spine and extracranial soft tissues are normal. SINUSES/ORBITS: No fluid levels or advanced mucosal thickening. No mastoid or middle ear effusion. The orbits are normal. MRA HEAD FINDINGS Intracranial internal carotid arteries: Short segment loss of flow related enhancement of the cavernous segment left internal carotid artery, possibly a motion artifact. Anterior cerebral arteries: Normal. Middle cerebral arteries: Normal. Posterior communicating arteries: Absent bilaterally. Posterior cerebral arteries: Normal. Basilar artery: Normal. Vertebral arteries: Left dominant. Normal. Superior cerebellar arteries: Normal. Anterior inferior cerebellar arteries: Normal. Posterior inferior cerebellar arteries: Not clearly visualized. IMPRESSION: 1. Motion degraded examination, lowering sensitivity and specificity. 2. No acute intracranial abnormality. 3. Chronic ischemic microangiopathy and generalized atrophy. 4. Short segment loss of flow related enhancement of the left internal carotid artery cavernous segment may be a motion artifact. Otherwise, no proximal occlusion or high-grade stenosis. Consider correlation with CT angiography. Electronically Signed   By: Ulyses Jarred M.D.   On: 08/14/2017 01:24     Assessment/Plan: 84 year old female presenting with altered mental status.  Although continues with some cognitive deficits her sister reports that she is at baseline.  Likely with a  dementia at baseline.  MRI of the brain reviewed and shows no acute changes.  Lab work suggestive of some dehydration.  Unclear if this was a TIA.  Patient on Xarelto.  Carotid dopplers show no evidence of hemodynamically significant stenosis on the right.  50-69% on the left.  Although CTA recommended, patient not likely a interventional candidate.  Will defer.  Echocardiogram pending.  A1c 5.4, LDL 97.  Recommendations: 1. Continue Xarelto.  Would D/C ASA 2. Statin for lipid management with target LDL<70. 3. PT/OT evaluation  Alexis Goodell, MD Neurology [phone] 08/14/2017, 5:57 PM

## 2017-08-14 NOTE — Progress Notes (Signed)
PT Cancellation Note  Patient Details Name: Mercedes Rodriguez MRN: [id number] DOB: 06/30/1929   Cancelled Treatment:    Reason Eval/Treat Not Completed: Patient at procedure or test/unavailable(Patient out of room, PT will follow up as able. )   Lieutenant Diego PT, DPT 9:14 AM,08/14/17 [phone]

## 2017-08-14 NOTE — Clinical Social Work Note (Signed)
Clinical Social Work Assessment  Patient Details  Name: Mercedes Rodriguez MRN: [id number] Date of Birth: 11/29/1929  Date of referral:  08/14/17               Reason for consult:  Facility Placement                Permission sought to share information with:  Case Manager, Customer service manager, Family Supports Permission granted to share information::  Yes, Verbal Permission Granted  Name::      SNF   Agency::   [facility]   Relationship::     Contact Information:     Housing/Transportation Living arrangements for the past 2 months:  Single Family Home Source of Information:  Other (Comment Required)(Granddaughter ) Patient Interpreter Needed:  None Criminal Activity/Legal Involvement Pertinent to Current Situation/Hospitalization:  No - Comment as needed Significant Relationships:  Adult Children, Other Family Members Lives with:  Adult Children Do you feel safe going back to the place where you live?  Yes Need for family participation in patient care:  Yes (Comment)  Care giving concerns:  Patient lives with her son in [place]    Social Worker assessment / plan:  CSW consulted for facility placement. CSW met with patient and introduced self and role. Patient states she lives alone and is confused. Patient gave CSW permission to speak with her family. CSW contacted patient's granddaughter Mercedes Rodriguez [phone]. Mercedes Rodriguez states that patient lives with her son. CSW explained the PT recommendation of SNF. Mercedes Rodriguez states that the family would be agreeable to SNF placement. Mercedes Rodriguez also states that patient went to [place] earlier this year for rehab. Per family, [facility] would be the preference due to proximity of their home. CSW will initiate bed search and give bed offers once received. CSW will follow for discharge planning.   Employment status:  Retired Nurse, adult PT Recommendations:  [place] / Referral to  community resources:  [place]  Patient/Family's Response to care:  Patient is very confused   Patient/Family's Understanding of and Emotional Response to Diagnosis, Current Treatment, and Prognosis:  Granddaughter thanked CSW for assistance   Emotional Assessment Appearance:  Appears stated age Attitude/Demeanor/Rapport:    Affect (typically observed):  Pleasant, Withdrawn Orientation:  Oriented to Self, Oriented to Place Alcohol / Substance use:  Not Applicable Psych involvement (Current and /or in the community):  No (Comment)  Discharge Needs  Concerns to be addressed:  Discharge Planning Concerns Readmission within the last 30 days:  No Current discharge risk:  None Barriers to Discharge:  Continued Medical Work up   [employer], [place] 08/14/2017, 2:02 PM

## 2017-08-14 NOTE — Evaluation (Addendum)
Occupational Therapy Evaluation Patient Details Name: Mercedes Rodriguez MRN: [id number] DOB: 1929-02-22 Today's Date: 08/14/2017    History of Present Illness Patient is 84 yo female that presented to the ED via EMS with AMS, admitted for stroke workup. 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 Prior to hospital admission, pt was living with her son and generally fairly independent (some assist from family for transportation and medication set up) and sometimes using an AD for mobility. Currently pt demonstrates impairments in strength, activity tolerance, and balance requiring min-mod assist for dressing and bathing and CGA-Min A mobility with RW. Pt does not have consistent/extensive family support at home and is currently unable to negotiate steps. Pt would benefit from skilled OT to address noted impairments and functional limitations (see below for any additional details) in order to maximize safety and independence while minimizing falls risk and caregiver burden.  Upon hospital discharge, recommend pt discharge to [facility].     Follow Up Recommendations  SNF    Equipment Recommendations  Other (comment)(TBD)    Recommendations for Other Services       Precautions / Restrictions Precautions Precautions: Fall Restrictions Weight Bearing Restrictions: No      Mobility Bed Mobility Overal bed mobility: Needs Assistance Bed Mobility: Supine to Sit;Sit to Supine     Supine to sit: Min assist Sit to supine: Min assist   General bed mobility comments: for LLE management and trunk elevation, modAx1 to scoot to EOB  Transfers Overall transfer level: Needs assistance Equipment used: Rolling walker (2 wheeled) Transfers: Sit to/from Stand Sit to Stand: Min guard;Min assist               Balance Overall balance assessment: Needs assistance Sitting-balance support: Feet supported;Bilateral upper extremity supported Sitting balance-Leahy Scale: Fair     Standing balance support: Bilateral upper extremity supported Standing balance-Leahy Scale: Poor                             ADL either performed or assessed with clinical judgement   ADL Overall ADL's : Needs assistance/impaired Eating/Feeding: Bed level;Set up;Supervision/ safety   Grooming: Bed level;Supervision/safety;Set up   Upper Body Bathing: Minimal assistance;Sitting   Lower Body Bathing: Moderate assistance;Sitting/lateral leans   Upper Body Dressing : Sitting;Minimal assistance   Lower Body Dressing: Sit to/from stand;Moderate assistance   Toilet Transfer: RW;Minimal assistance;BSC                   Vision Patient Visual Report: No change from baseline       Perception     Praxis      Pertinent Vitals/Pain Pain Assessment: No/denies pain     Hand Dominance Right   Extremity/Trunk Assessment Upper Extremity Assessment Upper Extremity Assessment: Generalized weakness(grossly 3+, grip bilaterally 4-/5)   Lower Extremity Assessment Lower Extremity Assessment: Defer to PT evaluation;Generalized weakness(grossly 3+/5 bilaterally) RLE Deficits / Details: 3+/5 grossly LLE Deficits / Details: 3+/5 grossly       Communication Communication Communication: HOH   Cognition Arousal/Alertness: Awake/alert Behavior During Therapy: WFL for tasks assessed/performed Overall Cognitive Status: Within Functional Limits for tasks assessed  General Comments       Exercises Other Exercises Other Exercises: pt instructed in how to reach nursing staff for any needs   Shoulder Instructions      Home Living Family/patient expects to be discharged to:: Private residence Living Arrangements:  Children(son) Available Help at Discharge: Family;Available PRN/intermittently(granddaughters check on her prn) Type of Home: House Home Access: Stairs to enter [employer] of Steps: 3 Entrance Stairs-Rails: None Home Layout: One level     Bathroom Shower/Tub: Teacher, early years/pre: Standard Bathroom Accessibility: Yes   Home Equipment: Grab bars - tub/shower   Additional Comments: Per family patient has walker at home, unable to recall if RW or 4WW. Patient demonstrated some confusion with history questions, extremely hard of hearing.       Prior Functioning/Environment Level of Independence: Independent with assistive device(s)        Comments: Reports using RW around the house         OT Problem List: Decreased strength;Decreased knowledge of use of DME or AE;Decreased activity tolerance;Impaired UE functional use;Impaired balance (sitting and/or standing)      OT Treatment/Interventions: Self-care/ADL training;Balance training;Therapeutic exercise;Therapeutic activities;DME and/or AE instruction;Patient/family education    OT Goals(Current goals can be found in the care plan section) Acute Rehab OT Goals Patient Stated Goal: get better and go home OT Goal Formulation: With patient Time For Goal Achievement: 08/28/17 Potential to Achieve Goals: Good ADL Goals Pt Will Perform Lower Body Dressing: with min assist;sit to/from stand Pt Will Transfer to Toilet: with supervision;ambulating(elevated commode, LRAD for amb)  OT Frequency: Min 1X/week   Barriers to D/C:            Co-evaluation              AM-PAC PT "6 Clicks" Daily Activity     Outcome Measure Help from another person eating meals?: None Help from another person taking care of personal grooming?: A Little Help from another person toileting, which includes using toliet, bedpan, or urinal?: A Little Help from another person bathing (including washing, rinsing, drying)?: A  Lot Help from another person to put on and taking off regular upper body clothing?: A Little Help from another person to put on and taking off regular lower body clothing?: A Lot 6 Click Score: 17   End of Session Nurse Communication: Other (comment)(notified Financial controller of need for call bell for low bed)  Activity Tolerance: Patient limited by fatigue Patient left: in bed;with call bell/phone within reach;with bed alarm set  OT Visit Diagnosis: Other abnormalities of gait and mobility (R26.89);Muscle weakness (generalized) (M62.81)                Time: 1610-9604 OT Time Calculation (min): 14 min Charges:  OT General Charges $OT Visit: 1 Visit OT Evaluation $OT Eval Low Complexity: 1 Low  Jeni Salles, MPH, MS, OTR/L ascom [phone] 08/14/17, 2:14 PM

## 2017-08-14 NOTE — NC FL2 (Addendum)
  [place] LEVEL OF CARE SCREENING TOOL     IDENTIFICATION  Patient Name: Mercedes Rodriguez Birthdate: January 01, 1930 Sex: female Admission Date (Current Location): 08/13/2017  [place] and [place] Number:  Engineering geologist and Address:  [facility] [address] [place], [place] [address]      Provider Number:    Attending Physician Name and Address:  Bettey Costa, MD  Relative Name and Phone Number:  Benjamine Mola- Granddaughter [phone]    Current Level of Care: Hospital Recommended Level of Care: [place] Prior Approval Number:    Date Approved/Denied:   PASRR Number: [id number]  Discharge Plan: SNF    Current Diagnoses: Patient Active Problem List   Diagnosis Date Noted  . Altered mental status 08/13/2017  . Cellulitis of right lower extremity 07/23/2017  . Pain of right lower extremity   . Localized swelling, mass and lump, lower limb   . GIB (gastrointestinal bleeding) 04/09/2017  . Bradycardia 02/21/2017  . Sinus bradycardia 02/20/2017  . HTN (hypertension) 01/29/2017  . Hard of hearing 01/21/2017  . Atrial fibrillation with RVR ([place])   . Palliative care encounter   . CHF (congestive heart failure) ([place]) 12/31/2016    Orientation RESPIRATION BLADDER Height & Weight     Self, Place  O2(2 liters ) Continent Weight: 160 lb (72.6 kg) Height:  5' (152.4 cm)  BEHAVIORAL SYMPTOMS/MOOD NEUROLOGICAL BOWEL NUTRITION STATUS  (none) (None) Continent Diet(Heart Healthy )  AMBULATORY STATUS COMMUNICATION OF NEEDS Skin   Extensive Assist Verbally Other (Comment)(Stage 1 on Sacrum)                       Personal Care Assistance Level of Assistance  Feeding, Bathing, Dressing Bathing Assistance: Limited assistance Feeding assistance: Independent Dressing Assistance: Limited assistance     Functional Limitations Info  Sight, Hearing, Speech Sight Info: Adequate Hearing Info: Adequate Speech Info:  Adequate    SPECIAL CARE FACTORS FREQUENCY  PT (By licensed PT), OT (By licensed OT)     PT Frequency: 5 OT Frequency: 5            Contractures Contractures Info: Not present    Additional Factors Info  Code Status, Allergies Code Status Info: DNR Allergies Info: NKA           Current Medications (08/14/2017):  This is the current hospital active medication list Current Facility-Administered Medications  Medication Dose Route Frequency Provider Last Rate Last Dose  . 0.9 %  sodium chloride infusion   Intravenous Continuous Epifanio Lesches, MD 50 mL/hr at 08/14/17 0600    . aspirin chewable tablet 81 mg  81 mg Oral Daily Epifanio Lesches, MD   81 mg at 08/14/17 1313  . [START ON 08/15/2017] metoprolol succinate (TOPROL-XL) 24 hr tablet 12.5 mg  12.5 mg Oral Daily Mody, Sital, MD      . Rivaroxaban (XARELTO) tablet 15 mg  15 mg Oral Q supper Epifanio Lesches, MD         Discharge Medications: Please see discharge summary for a list of discharge medications.  Relevant Imaging Results:  Relevant Lab Results:   Additional Information SSN [id number]  Annamaria Boots, [place]

## 2017-08-14 NOTE — Progress Notes (Signed)
*  PRELIMINARY RESULTS* Echocardiogram 2D Echocardiogram has been performed.  Sherrie Sport 08/14/2017, 9:57 AM

## 2017-08-14 NOTE — Care Management Note (Signed)
Case Management Note  Patient Details  Name: Mercedes Rodriguez MRN: [id number] Date of Birth: 08-22-1929  Subjective/Objective:     Admitted to [facility] under observation status with the diagnosis of altered mental staus. Son [name] lives in the home. Granddaughter is [name] [phone]). Sees Dr. Clemmie Krill as primary care physician. Prescriptions are filled in [facility]. No home Health. No skilled nursing Chronic home oxygen 3 liters per nasal cannula. Takes care of all basic activities of daily living herself, doesn't drive. No falls. Fair-good appetite. Patient having testing done. Spoke with sister Herbert Pun Till in the room. States her sister's daughter passed away  3 months ago  And she hasn't felt good since then. States the daughter was the main person that help her with errands.            Action/Plan: Will continue to follow for plans   Expected Discharge Date:                  Expected Discharge Plan:     In-House Referral:   yes  Discharge planning Services     Post Acute Care Choice:    Choice offered to:     DME Arranged:    DME Agency:     HH Arranged:    HH Agency:     Status of Service:     If discussed at [employer] of Stay Meetings, dates discussed:    Additional Comments:  Shelbie Ammons, RN MSN CCM Care Management [phone] 08/14/2017, 9:21 AM

## 2017-08-14 NOTE — Plan of Care (Signed)
Pt. Progressing. Discussed Stroke plan of care. Discharge planning; social worker looking into transition to facilitate meeting of need. Granddaughters aware of plan of care.

## 2017-08-15 DIAGNOSIS — Z803 Family history of malignant neoplasm of breast: Secondary | ICD-10-CM | POA: Diagnosis not present

## 2017-08-15 DIAGNOSIS — R4182 Altered mental status, unspecified: Secondary | ICD-10-CM | POA: Diagnosis present

## 2017-08-15 DIAGNOSIS — H9190 Unspecified hearing loss, unspecified ear: Secondary | ICD-10-CM | POA: Diagnosis not present

## 2017-08-15 DIAGNOSIS — G9341 Metabolic encephalopathy: Secondary | ICD-10-CM | POA: Diagnosis not present

## 2017-08-15 DIAGNOSIS — Z8249 Family history of ischemic heart disease and other diseases of the circulatory system: Secondary | ICD-10-CM | POA: Diagnosis not present

## 2017-08-15 DIAGNOSIS — L8990 Pressure ulcer of unspecified site, unspecified stage: Secondary | ICD-10-CM

## 2017-08-15 DIAGNOSIS — Z7901 Long term (current) use of anticoagulants: Secondary | ICD-10-CM | POA: Diagnosis not present

## 2017-08-15 DIAGNOSIS — F0390 Unspecified dementia without behavioral disturbance: Secondary | ICD-10-CM | POA: Diagnosis not present

## 2017-08-15 DIAGNOSIS — Z87891 Personal history of nicotine dependence: Secondary | ICD-10-CM | POA: Diagnosis not present

## 2017-08-15 DIAGNOSIS — Z801 Family history of malignant neoplasm of trachea, bronchus and lung: Secondary | ICD-10-CM | POA: Diagnosis not present

## 2017-08-15 DIAGNOSIS — I481 Persistent atrial fibrillation: Secondary | ICD-10-CM | POA: Diagnosis not present

## 2017-08-15 DIAGNOSIS — N390 Urinary tract infection, site not specified: Secondary | ICD-10-CM | POA: Diagnosis not present

## 2017-08-15 DIAGNOSIS — Z8349 Family history of other endocrine, nutritional and metabolic diseases: Secondary | ICD-10-CM | POA: Diagnosis not present

## 2017-08-15 DIAGNOSIS — Z66 Do not resuscitate: Secondary | ICD-10-CM | POA: Diagnosis not present

## 2017-08-15 DIAGNOSIS — I5032 Chronic diastolic (congestive) heart failure: Secondary | ICD-10-CM | POA: Diagnosis not present

## 2017-08-15 DIAGNOSIS — N189 Chronic kidney disease, unspecified: Secondary | ICD-10-CM | POA: Diagnosis not present

## 2017-08-15 DIAGNOSIS — I130 Hypertensive heart and chronic kidney disease with heart failure and stage 1 through stage 4 chronic kidney disease, or unspecified chronic kidney disease: Secondary | ICD-10-CM | POA: Diagnosis not present

## 2017-08-15 DIAGNOSIS — G9340 Encephalopathy, unspecified: Secondary | ICD-10-CM | POA: Diagnosis present

## 2017-08-15 DIAGNOSIS — M6281 Muscle weakness (generalized): Secondary | ICD-10-CM | POA: Diagnosis not present

## 2017-08-15 LAB — BASIC METABOLIC PANEL
Anion gap: 8 (ref 5–15)
BUN: 23 mg/dL (ref 8–23)
CALCIUM: 8.1 mg/dL — AB (ref 8.9–10.3)
CO2: 26 mmol/L (ref 22–32)
Chloride: 104 mmol/L (ref 98–111)
Creatinine, Ser: 1.42 mg/dL — ABNORMAL HIGH (ref 0.44–1.00)
GFR calc non Af Amer: 33 mL/min — ABNORMAL LOW (ref >60)
GFR, EST AFRICAN AMERICAN: 38 mL/min — AB (ref >60)
Glucose, Bld: 104 mg/dL — ABNORMAL HIGH (ref 70–99)
Potassium: 3.9 mmol/L (ref 3.5–5.1)
Sodium: 138 mmol/L (ref 135–145)

## 2017-08-15 MED ORDER — SODIUM CHLORIDE 0.9% FLUSH
3.0000 mL | Freq: Two times a day (BID) | INTRAVENOUS | Status: DC
Start: 2017-08-15 — End: 2017-08-16
  Administered 2017-08-15 – 2017-08-16 (×3): 3 mL via INTRAVENOUS

## 2017-08-15 MED ORDER — ATORVASTATIN CALCIUM 20 MG PO TABS
40.0000 mg | ORAL_TABLET | Freq: Every day | ORAL | Status: DC
  Administered 2017-08-15: 18:00:00 40 mg via ORAL
  Filled 2017-08-15: qty 2

## 2017-08-15 MED ORDER — METOPROLOL SUCCINATE ER 50 MG PO TB24
50.0000 mg | ORAL_TABLET | Freq: Every day | ORAL | Status: DC
  Administered 2017-08-16: 50 mg via ORAL
  Filled 2017-08-15: qty 1

## 2017-08-15 MED ORDER — METOPROLOL TARTRATE 25 MG PO TABS
37.5000 mg | ORAL_TABLET | Freq: Once | ORAL | Status: AC
  Administered 2017-08-15: 37.5 mg via ORAL
  Filled 2017-08-15: qty 2

## 2017-08-15 NOTE — Progress Notes (Signed)
Temp 100.3. Son assisting pt with supper with straw use. Education done with straw removed. Occasional weak nonproductive cough. Denies co's/perparing to sleep with no other changes from baseline.

## 2017-08-15 NOTE — Progress Notes (Addendum)
[place] [facility] [place] NAME: Mercedes Rodriguez    MR#:  [id number]  DATE OF BIRTH:  Jan 26, 1929  SUBJECTIVE:  CHIEF COMPLAINT:   Chief Complaint  Patient presents with  . Altered Mental Status  Patient without complaint, for skilled nursing facility placement on Monday, continue physical therapy, neurology and speech therapy input appreciated  REVIEW OF SYSTEMS:  CONSTITUTIONAL: No fever, fatigue or weakness.  EYES: No blurred or double vision.  EARS, NOSE, AND THROAT: No tinnitus or ear pain.  RESPIRATORY: No cough, shortness of breath, wheezing or hemoptysis.  CARDIOVASCULAR: No chest pain, orthopnea, edema.  GASTROINTESTINAL: No nausea, vomiting, diarrhea or abdominal pain.  GENITOURINARY: No dysuria, hematuria.  ENDOCRINE: No polyuria, nocturia,  HEMATOLOGY: No anemia, easy bruising or bleeding SKIN: No rash or lesion. MUSCULOSKELETAL: No joint pain or arthritis.   NEUROLOGIC: No tingling, numbness, weakness.  PSYCHIATRY: No anxiety or depression.   ROS  DRUG ALLERGIES:  No Known Allergies  VITALS:  Blood pressure 136/74, pulse (!) 115, temperature 98.1 F (36.7 C), temperature source Oral, resp. rate 18, height 5' (1.524 m), weight 72.6 kg, SpO2 100 %.  PHYSICAL EXAMINATION:  GENERAL:  84 y.o.-year-old patient lying in the bed with no acute distress.  EYES: Pupils equal, round, reactive to light and accommodation. No scleral icterus. Extraocular muscles intact.  HEENT: Head atraumatic, normocephalic. Oropharynx and nasopharynx clear.  NECK:  Supple, no jugular venous distention. No thyroid enlargement, no tenderness.  LUNGS: Normal breath sounds bilaterally, no wheezing, rales,rhonchi or crepitation. No use of accessory muscles of respiration.  CARDIOVASCULAR: S1, S2 normal. No murmurs, rubs, or gallops.  ABDOMEN: Soft, nontender, nondistended. Bowel sounds present. No organomegaly or mass.  EXTREMITIES: No pedal edema, cyanosis, or  clubbing.  NEUROLOGIC: Cranial nerves II through XII are intact. Muscle strength 5/5 in all extremities. Sensation intact. Gait not checked.  PSYCHIATRIC: The patient is alert and oriented x 3.  SKIN: No obvious rash, lesion, or ulcer.   Physical Exam LABORATORY PANEL:   CBC Recent Labs  Lab 08/13/17 0915  WBC 13.3*  HGB 10.9*  HCT 34.7*  PLT 313   ------------------------------------------------------------------------------------------------------------------  Chemistries  Recent Labs  Lab 08/13/17 0915 08/15/17 0509  NA 139 138  K 4.2 3.9  CL 103 104  CO2 27 26  GLUCOSE 105* 104*  BUN 26* 23  CREATININE 1.55* 1.42*  CALCIUM 8.8* 8.1*  AST 25  --   ALT 10  --   ALKPHOS 78  --   BILITOT 0.8  --    ------------------------------------------------------------------------------------------------------------------  Cardiac Enzymes Recent Labs  Lab 08/13/17 0915  TROPONINI <0.03   ------------------------------------------------------------------------------------------------------------------  RADIOLOGY:  Mr Brain Wo Contrast  Result Date: 08/14/2017 CLINICAL DATA:  Stroke follow-up.  Confusion.  Atrial fibrillation. EXAM: MRI HEAD WITHOUT CONTRAST MRA HEAD WITHOUT CONTRAST TECHNIQUE: Multiplanar, multiecho pulse sequences of the brain and surrounding structures were obtained without intravenous contrast. Angiographic images of the head were obtained using MRA technique without contrast. COMPARISON:  Head CT 08/13/2017 FINDINGS: The study is degraded by motion, despite efforts to reduce this artifact, including utilization of motion-resistant MR sequences. The findings of the study are interpreted in the context of reduced sensitivity/specificity. MRI HEAD FINDINGS BRAIN: The midline structures are normal. There is no acute infarct, acute hemorrhage or mass. Early confluent hyperintense T2-weighted signal of the periventricular and deep white matter, most commonly due to  chronic ischemic microangiopathy. Generalized atrophy without lobar predilection. Blood-sensitive sequences show no chronic  microhemorrhage or superficial siderosis. SKULL AND UPPER CERVICAL SPINE: The visualized skull base, calvarium, upper cervical spine and extracranial soft tissues are normal. SINUSES/ORBITS: No fluid levels or advanced mucosal thickening. No mastoid or middle ear effusion. The orbits are normal. MRA HEAD FINDINGS Intracranial internal carotid arteries: Short segment loss of flow related enhancement of the cavernous segment left internal carotid artery, possibly a motion artifact. Anterior cerebral arteries: Normal. Middle cerebral arteries: Normal. Posterior communicating arteries: Absent bilaterally. Posterior cerebral arteries: Normal. Basilar artery: Normal. Vertebral arteries: Left dominant. Normal. Superior cerebellar arteries: Normal. Anterior inferior cerebellar arteries: Normal. Posterior inferior cerebellar arteries: Not clearly visualized. IMPRESSION: 1. Motion degraded examination, lowering sensitivity and specificity. 2. No acute intracranial abnormality. 3. Chronic ischemic microangiopathy and generalized atrophy. 4. Short segment loss of flow related enhancement of the left internal carotid artery cavernous segment may be a motion artifact. Otherwise, no proximal occlusion or high-grade stenosis. Consider correlation with CT angiography. Electronically Signed   By: Ulyses Jarred M.D.   On: 08/14/2017 01:24   US Carotid Bilateral (at [facility] And [facility] Only)  Result Date: 08/14/2017 CLINICAL DATA:  TIA. History of hypertension. Former smoker. History of atrial fibrillation. EXAM: BILATERAL CAROTID DUPLEX ULTRASOUND TECHNIQUE: Pearline Cables scale imaging, color Doppler and duplex ultrasound were performed of bilateral carotid and vertebral arteries in the neck. COMPARISON:  None. FINDINGS: Criteria: Quantification of carotid stenosis is based on velocity parameters that correlate the residual  internal carotid diameter with NASCET-based stenosis levels, using the diameter of the distal internal carotid lumen as the denominator for stenosis measurement. The following velocity measurements were obtained: RIGHT ICA:  63/22 cm/sec CCA:  474/25 cm/sec SYSTOLIC ICA/CCA RATIO:  1.3 ECA:  72 cm/sec LEFT ICA:  119/37 cm/sec CCA:  95/63 cm/sec SYSTOLIC ICA/CCA RATIO:  2.0 ECA:  99 cm/sec RIGHT CAROTID ARTERY: There is a minimal to moderate amount of mixed echogenic plaque within the right carotid bulb (image 16), extending to involve the origin and proximal aspects of the right internal carotid artery (image 24), not resulting in elevated peak systolic velocities within the interrogated course of the right internal carotid artery to suggest a hemodynamically significant stenosis. RIGHT VERTEBRAL ARTERY:  Antegrade flow LEFT CAROTID ARTERY: There is a moderate to large amount of echogenic plaque within the left carotid bulb (image 50) extending to involve the origin and proximal aspects of the left internal carotid artery (image 58) morphologically resulting in 50% luminal narrowing with borderline elevated peak systolic velocities within the left internal carotid artery. Greatest acquired peak systolic velocity within the left internal carotid artery measures 119 centimeters/second (image 63). LEFT VERTEBRAL ARTERY:  Antegrade Flow IMPRESSION: 1. Large amount of left-sided atherosclerotic plaque morphologically results in 50% luminal narrowing and is associated with borderline elevated peak systolic velocities which approach the 50-69% luminal narrowing range. Further evaluation with CTA could be performed as clinically indicated. 2. Minimal to moderate amount of right-sided atherosclerotic plaque, not resulting in a hemodynamically significant stenosis. Electronically Signed   By: Sandi Mariscal M.D.   On: 08/14/2017 10:34   Mr Jodene Nam Head/brain OV Cm  Result Date: 08/14/2017 CLINICAL DATA:  Stroke follow-up.   Confusion.  Atrial fibrillation. EXAM: MRI HEAD WITHOUT CONTRAST MRA HEAD WITHOUT CONTRAST TECHNIQUE: Multiplanar, multiecho pulse sequences of the brain and surrounding structures were obtained without intravenous contrast. Angiographic images of the head were obtained using MRA technique without contrast. COMPARISON:  Head CT 08/13/2017 FINDINGS: The study is degraded by motion, despite efforts to reduce this artifact,  including utilization of motion-resistant MR sequences. The findings of the study are interpreted in the context of reduced sensitivity/specificity. MRI HEAD FINDINGS BRAIN: The midline structures are normal. There is no acute infarct, acute hemorrhage or mass. Early confluent hyperintense T2-weighted signal of the periventricular and deep white matter, most commonly due to chronic ischemic microangiopathy. Generalized atrophy without lobar predilection. Blood-sensitive sequences show no chronic microhemorrhage or superficial siderosis. SKULL AND UPPER CERVICAL SPINE: The visualized skull base, calvarium, upper cervical spine and extracranial soft tissues are normal. SINUSES/ORBITS: No fluid levels or advanced mucosal thickening. No mastoid or middle ear effusion. The orbits are normal. MRA HEAD FINDINGS Intracranial internal carotid arteries: Short segment loss of flow related enhancement of the cavernous segment left internal carotid artery, possibly a motion artifact. Anterior cerebral arteries: Normal. Middle cerebral arteries: Normal. Posterior communicating arteries: Absent bilaterally. Posterior cerebral arteries: Normal. Basilar artery: Normal. Vertebral arteries: Left dominant. Normal. Superior cerebellar arteries: Normal. Anterior inferior cerebellar arteries: Normal. Posterior inferior cerebellar arteries: Not clearly visualized. IMPRESSION: 1. Motion degraded examination, lowering sensitivity and specificity. 2. No acute intracranial abnormality. 3. Chronic ischemic microangiopathy and  generalized atrophy. 4. Short segment loss of flow related enhancement of the left internal carotid artery cavernous segment may be a motion artifact. Otherwise, no proximal occlusion or high-grade stenosis. Consider correlation with CT angiography. Electronically Signed   By: Ulyses Jarred M.D.   On: 08/14/2017 01:24    ASSESSMENT AND PLAN:  84 year old female with a history of persistent atrial fibrillation on Xarelto who presents with confusion and difficulty finding words.  *Acute encephalopathy Suspect due to dementia-neurology input appreciated, MRI was negative for any acute process/noted old left MCA infarct, physical therapy recommending skilled nursing facility-tentatively plan for on Monday, UA/chest x-ray negative for infection, TSH normal, carotid Dopplers negative for hemodynamically significant stenosis, follow-up on echocardiogram, on Xarelto, started on Lipitor  *Persistent atrial fibrillation with RVR Continue Xarelto and increase beta-blocker therapy  *Essential hypertension Stable on current regiment  All the records are reviewed and case discussed with Care Management/Social Workerr. Management plans discussed with the patient, family and they are in agreement.  CODE STATUS: dnr  TOTAL TIME TAKING CARE OF THIS PATIENT: 45 minutes.     POSSIBLE D/C IN 1-3 DAYS, DEPENDING ON CLINICAL CONDITION.   Avel Peace Kanon Colunga M.D on 08/15/2017   Between 7am to 6pm - Pager - [phone]  After 6pm go to [email or web address] - password [email or web address] [facility]  Office  [phone]  CC: Primary care physician; Lynnell Jude, MD  Note: This dictation was prepared with Dragon dictation along with smaller phrase technology. Any transcriptional errors that result from this process are unintentional.

## 2017-08-15 NOTE — Progress Notes (Addendum)
error 

## 2017-08-15 NOTE — Progress Notes (Signed)
Endoscopy report given to Jess. Pt ready to go for procedure.

## 2017-08-16 DIAGNOSIS — R4182 Altered mental status, unspecified: Secondary | ICD-10-CM | POA: Diagnosis not present

## 2017-08-16 DIAGNOSIS — G9341 Metabolic encephalopathy: Secondary | ICD-10-CM | POA: Diagnosis not present

## 2017-08-16 LAB — CBC
HEMATOCRIT: 29.9 % — AB (ref 35.0–47.0)
Hemoglobin: 9.8 g/dL — ABNORMAL LOW (ref 12.0–16.0)
MCH: 26.3 pg (ref 26.0–34.0)
MCHC: 32.7 g/dL (ref 32.0–36.0)
MCV: 80.6 fL (ref 80.0–100.0)
Platelets: 244 10*3/uL (ref 150–440)
RBC: 3.71 MIL/uL — ABNORMAL LOW (ref 3.80–5.20)
RDW: 16.6 % — ABNORMAL HIGH (ref 11.5–14.5)
WBC: 10.9 10*3/uL (ref 3.6–11.0)

## 2017-08-16 MED ORDER — ATORVASTATIN CALCIUM 40 MG PO TABS: 40.0000 mg | ORAL_TABLET | Freq: Every day | ORAL | 0 refills | Status: DC

## 2017-08-16 MED ORDER — CIPROFLOXACIN HCL 0.3 % OP SOLN
2.0000 [drp] | OPHTHALMIC | Status: DC
  Administered 2017-08-16: 2 [drp] via OPHTHALMIC
  Filled 2017-08-16: qty 2.5

## 2017-08-16 NOTE — Care Management Note (Addendum)
Case Management Note  Patient Details  Name: Mercedes Rodriguez MRN: [id number] Date of Birth: 07-19-1929  Subjective/Objective:   Patient to be discharged per MD order. Orders in place for home health services. Patient un oriented at this time so RNCM spoke to grandchildren primarily [name] [phone]. Family in agreement for home health services. Given choice, referral place with [name] from [place] care who accepts the patient for PT,RN aide and social work. Patient has walker, wheelchair, bsc in home. Patient on chronic oxygen. Family to transport. Palliative referral made. [name] RN BSN RNCM [phone]                     Action/Plan:   Expected Discharge Date:  08/16/17               Expected Discharge Plan:  [place]  In-House Referral:     Discharge planning Services  CM Consult  Post Acute Care Choice:  Home Health Choice offered to:  Patient, [facility] POA / Guardian, Sibling  DME Arranged:    DME Agency:     HH Arranged:  RN, PT, Nurse's Aide, Social Work [employer] Agency:  [place]  Status of Service:  Completed, signed off  If discussed at [employer], dates discussed:    Additional Comments:  [name], RN 08/16/2017, 12:32 PM

## 2017-08-16 NOTE — Progress Notes (Signed)
Discharge home with family with portable 02 obtained via [place], case mgt. Oral and written AVS instructions given to granddgt and other family member with stated understanding. Pt alert, VSS; denies co's. Ate small amount lunch and breakfast and tolerated. Discharge home to self/family care with palliative care consult submitted for Home Services.

## 2017-08-16 NOTE — Discharge Summary (Signed)
[facility] [place] NAME: Mercedes Rodriguez    MR#:  [id number]  DATE OF BIRTH:  [birth year]  DATE OF ADMISSION:  08/13/2017 ADMITTING PHYSICIAN: Epifanio Lesches, MD  DATE OF DISCHARGE: No discharge date for patient encounter.  PRIMARY CARE PHYSICIAN: Lynnell Jude, MD    ADMISSION DIAGNOSIS:  TIA (transient ischemic attack) [G45.9] Altered mental status, unspecified altered mental status type [R41.82]  DISCHARGE DIAGNOSIS:  Active Problems:   Altered mental status   Pressure injury of skin   Encephalopathy acute   SECONDARY DIAGNOSIS:   Past Medical History:  Diagnosis Date  . Arrhythmia   . Atrial fibrillation ([place])   . CHF (congestive heart failure) ([place])   . Hearing loss   . Hypertension     HOSPITAL COURSE:   84 year old female with a history of persistent atrial fibrillation on Xarelto who presents with confusion and difficulty finding words.  *Acute encephalopathy Stable Most likely due to worsening dementia Neurology did see patient while in house, MRI was negative for any acute process/noted old left MCA infarct, physical therapy recommending skilled nursing facility-tentatively plan for on Monday, UA/chest x-ray negative for infection, TSH normal, carotid Dopplers negative for hemodynamically significant stenosis, echocardiogram done-report pending at the time of discharge/patient to follow-up with primary care provider in 3 days for results, on Xarelto, started on Lipitor  *Persistent atrial fibrillation with RVR Stable Continue Xarelto and beta-blocker therapy  *Essential hypertension Stable on current regiment  Long-term prognosis is poor, case discussed with the patient's daughter on the day of discharge, patient will be discharged to home on palliative care services-had been on hospice services earlier this year  DISCHARGE CONDITIONS:   guarded  CONSULTS OBTAINED:  Treatment Team:   Alexis Goodell, MD  DRUG ALLERGIES:  No Known Allergies  DISCHARGE MEDICATIONS:   Allergies as of 08/16/2017   No Known Allergies     Medication List    TAKE these medications   acetaminophen 325 MG tablet Commonly known as:  TYLENOL Take 2 tablets (650 mg total) by mouth every 6 (six) hours as needed for mild pain (or Fever >/= 101).   atorvastatin 40 MG tablet Commonly known as:  LIPITOR Take 1 tablet (40 mg total) by mouth daily at 6 PM.   docusate sodium 100 MG capsule Commonly known as:  COLACE Take 1 capsule (100 mg total) by mouth daily as needed for mild constipation or moderate constipation. Do not take if you have loose stools   ferrous sulfate 325 (65 FE) MG EC tablet Take 1 tablet (325 mg total) by mouth 2 (two) times daily with a meal.   furosemide 40 MG tablet Commonly known as:  LASIX Take 0.5 tablets (20 mg total) by mouth daily.   metoprolol succinate 100 MG 24 hr tablet Commonly known as:  TOPROL-XL Take 100 mg by mouth daily. Take with or immediately following a meal.   potassium chloride SA 20 MEQ tablet Commonly known as:  K-DUR,KLOR-CON Take 1 tablet (20 mEq total) by mouth daily.   ranitidine 150 MG tablet Commonly known as:  ZANTAC Take 150 mg by mouth 2 (two) times daily as needed for heartburn.   Rivaroxaban 15 MG Tabs tablet Commonly known as:  XARELTO Take 1 tablet (15 mg total) by mouth daily with supper.   traZODone 100 MG tablet Commonly known as:  DESYREL Take 1 tablet (100 mg total) by mouth at bedtime as needed for sleep. What changed:  when to take this   triamcinolone cream 0.1 % Commonly known as:  KENALOG Apply 1 application topically 2 (two) times daily.        DISCHARGE INSTRUCTIONS:  If you experience worsening of your admission symptoms, develop shortness of breath, life threatening emergency, suicidal or homicidal thoughts you must seek medical attention immediately by calling 911 or calling your MD  immediately  if symptoms less severe.  You Must read complete instructions/literature along with all the possible adverse reactions/side effects for all the Medicines you take and that have been prescribed to you. Take any new Medicines after you have completely understood and accept all the possible adverse reactions/side effects.   Please note  You were cared for by a hospitalist during your hospital stay. If you have any questions about your discharge medications or the care you received while you were in the hospital after you are discharged, you can call the unit and asked to speak with the hospitalist on call if the hospitalist that took care of you is not available. Once you are discharged, your primary care physician will handle any further medical issues. Please note that NO REFILLS for any discharge medications will be authorized once you are discharged, as it is imperative that you return to your primary care physician (or establish a relationship with a primary care physician if you do not have one) for your aftercare needs so that they can reassess your need for medications and monitor your lab values.    Today   CHIEF COMPLAINT:   Chief Complaint  Patient presents with  . Altered Mental Status    HISTORY OF PRESENT ILLNESS:  84 y.o. female with a known history of atrial fibrillation on Xarelto, essential hypertension called EMS this morning, by the time EMS and patient appeared confused and does not know why she called EMS.  And being evaluated in the emergency room, we are asked to admit for evaluation of possible stroke.  Patient is alert, hard of hearing and tells me that she does not know why she called EMS.  Also appears to have expressive aphasia trying to tell something and she forgets what she wants to tell.  No obvious weakness of hands or legs.  Speech is clear, no facial droop.  Patient lives at home by herself.  Admitted on July 18 th for right thigh cellulitis.  VITAL  SIGNS:  Blood pressure 112/63, pulse (!) 115, temperature 99.1 F (37.3 C), temperature source Oral, resp. rate 20, height 5' (1.524 m), weight 72.6 kg, SpO2 100 %.  I/O:    Intake/Output Summary (Last 24 hours) at 08/16/2017 1120 Last data filed at 08/15/2017 1239 Gross per 24 hour  Intake 1310 ml  Output -  Net 1310 ml    PHYSICAL EXAMINATION:  GENERAL:  84 y.o.-year-old patient lying in the bed with no acute distress.  EYES: Pupils equal, round, reactive to light and accommodation. No scleral icterus. Extraocular muscles intact.  HEENT: Head atraumatic, normocephalic. Oropharynx and nasopharynx clear.  NECK:  Supple, no jugular venous distention. No thyroid enlargement, no tenderness.  LUNGS: Normal breath sounds bilaterally, no wheezing, rales,rhonchi or crepitation. No use of accessory muscles of respiration.  CARDIOVASCULAR: S1, S2 normal. No murmurs, rubs, or gallops.  ABDOMEN: Soft, non-tender, non-distended. Bowel sounds present. No organomegaly or mass.  EXTREMITIES: No pedal edema, cyanosis, or clubbing.  NEUROLOGIC: Cranial nerves II through XII are intact. Muscle strength 5/5 in all extremities. Sensation intact. Gait not checked.  PSYCHIATRIC:  The patient is alert and oriented x 3.  SKIN: No obvious rash, lesion, or ulcer.   DATA REVIEW:   CBC Recent Labs  Lab 08/16/17 0531  WBC 10.9  HGB 9.8*  HCT 29.9*  PLT 244    Chemistries  Recent Labs  Lab 08/13/17 0915 08/15/17 0509  NA 139 138  K 4.2 3.9  CL 103 104  CO2 27 26  GLUCOSE 105* 104*  BUN 26* 23  CREATININE 1.55* 1.42*  CALCIUM 8.8* 8.1*  AST 25  --   ALT 10  --   ALKPHOS 78  --   BILITOT 0.8  --     Cardiac Enzymes Recent Labs  Lab 08/13/17 0915  TROPONINI <0.03    Microbiology Results  Results for orders placed or performed during the hospital encounter of 07/22/17  Culture, blood (routine x 2)     Status: None   Collection Time: 07/23/17 12:11 AM  Result Value Ref Range Status    Specimen Description BLOOD RIGHT AC  Final   Special Requests   Final    BOTTLES DRAWN AEROBIC AND ANAEROBIC Blood Culture results may not be optimal due to an excessive volume of blood received in culture bottles   Culture   Final    NO GROWTH 5 DAYS Performed at [facility], [address] [place], [place] [address]    Report Status 07/28/2017 FINAL  Final  Culture, blood (routine x 2)     Status: None   Collection Time: 07/23/17  2:41 AM  Result Value Ref Range Status   Specimen Description BLOOD RIGHT ANTECUBITAL  Final   Special Requests   Final    BOTTLES DRAWN AEROBIC AND ANAEROBIC Blood Culture adequate volume   Culture   Final    NO GROWTH 5 DAYS Performed at [facility], [address], [place], [place] [address]    Report Status 07/28/2017 FINAL  Final    RADIOLOGY:  No results found.  EKG:   Orders placed or performed during the hospital encounter of 08/13/17  . ED EKG  . ED EKG  . ED EKG  . ED EKG      Management plans discussed with the patient, family and they are in agreement.  CODE STATUS:     Code Status Orders  (From admission, onward)         Start     Ordered   08/13/17 1524  Do not attempt resuscitation (DNR)  Continuous    Question Answer Comment  In the event of cardiac or respiratory ARREST Do not call a "code blue"   In the event of cardiac or respiratory ARREST Do not perform Intubation, CPR, defibrillation or ACLS   In the event of cardiac or respiratory ARREST Use medication by any route, position, wound care, and other measures to relive pain and suffering. May use oxygen, suction and manual treatment of airway obstruction as needed for comfort.   Comments RN may pronounce      08/13/17 1530        Code Status History    Date Active Date Inactive Code Status Order ID Comments User Context   07/23/2017 0158 07/25/2017 1853 DNR [id number]  Arta Silence, MD Inpatient   04/09/2017 1744 04/13/2017 1840 DNR  [id number]  Gorden Harms, MD Inpatient   02/20/2017 1625 02/23/2017 1449 DNR [id number]  [name], MD Inpatient   12/31/2016 1206 01/08/2017 1750 DNR [id number]  Bettey Costa, MD Inpatient  Advance Directive Documentation     Most Recent Value  Type of Advance Directive  Out of facility DNR (pink MOST or yellow form)  Pre-existing out of facility DNR order (yellow form or pink MOST form)  Yellow form placed in chart (order not valid for inpatient use)  "MOST" Form in Place?  -      TOTAL TIME TAKING CARE OF THIS PATIENT: 45 minutes.    Avel Peace Salary M.D on 08/16/2017 at 11:20 AM  Between 7am to 6pm - Pager - [phone]  After 6pm go to [email or web address] - password [email or web address] [facility]  Office  [phone]  CC: Primary care physician; Lynnell Jude, MD   Note: This dictation was prepared with Dragon dictation along with smaller phrase technology. Any transcriptional errors that result from this process are unintentional.

## 2017-08-16 NOTE — Progress Notes (Signed)
Pt transported in transport chair to private vehicle accompanied by family. Discharge home.

## 2017-08-16 NOTE — Clinical Social Work Note (Signed)
Patient's family has chosen to decline SNF and care for the patient in her home. The RNCM has addressed home health needs. CSW is signing off. Please consult should needs arise.  [name], MSW, [name] [phone]

## 2017-08-16 NOTE — Discharge Instructions (Signed)

## 2017-08-16 NOTE — Plan of Care (Signed)
  Problem: Education: Goal: Knowledge of General Education information will improve Description Including pain rating scale, medication(s)/side effects and non-pharmacologic comfort measures 08/16/2017 0606 by [facility], Woodie Degraffenreid M, RN Outcome: Progressing 08/16/2017 0606 by Jeryl [place], Mattie Novosel M, RN Outcome: Progressing

## 2017-08-30 ENCOUNTER — Ambulatory Visit: Admission: RE | Admit: 2017-08-30 | Payer: Medicare HMO | Source: Ambulatory Visit

## 2017-08-31 ENCOUNTER — Inpatient Hospital Stay: Payer: Medicare HMO | Admitting: Oncology

## 2017-08-31 ENCOUNTER — Telehealth: Payer: Self-pay | Admitting: *Deleted

## 2017-08-31 ENCOUNTER — Encounter: Payer: Self-pay | Admitting: Oncology

## 2017-08-31 NOTE — Telephone Encounter (Signed)
Thanks for update

## 2017-08-31 NOTE — Telephone Encounter (Signed)
MRI called to report that patient did not show up for her MRI on 8/25

## 2017-09-03 ENCOUNTER — Inpatient Hospital Stay
Admission: EM | Admit: 2017-09-03 | Discharge: 2017-09-04 | DRG: 844 | Disposition: A | Discharge status: Hospice | Payer: Medicare HMO | Attending: Internal Medicine | Admitting: Internal Medicine

## 2017-09-03 ENCOUNTER — Emergency Department: Payer: Medicare HMO

## 2017-09-03 ENCOUNTER — Encounter: Payer: Self-pay | Admitting: Emergency Medicine

## 2017-09-03 ENCOUNTER — Other Ambulatory Visit: Payer: Self-pay

## 2017-09-03 DIAGNOSIS — C799 Secondary malignant neoplasm of unspecified site: Secondary | ICD-10-CM | POA: Diagnosis not present

## 2017-09-03 DIAGNOSIS — C800 Disseminated malignant neoplasm, unspecified: Secondary | ICD-10-CM | POA: Diagnosis present

## 2017-09-03 DIAGNOSIS — R079 Chest pain, unspecified: Secondary | ICD-10-CM

## 2017-09-03 DIAGNOSIS — C4921 Malignant neoplasm of connective and soft tissue of right lower limb, including hip: Secondary | ICD-10-CM | POA: Diagnosis present

## 2017-09-03 DIAGNOSIS — K219 Gastro-esophageal reflux disease without esophagitis: Secondary | ICD-10-CM | POA: Diagnosis present

## 2017-09-03 DIAGNOSIS — F0390 Unspecified dementia without behavioral disturbance: Secondary | ICD-10-CM | POA: Diagnosis present

## 2017-09-03 DIAGNOSIS — I482 Chronic atrial fibrillation: Secondary | ICD-10-CM | POA: Diagnosis present

## 2017-09-03 DIAGNOSIS — Z85028 Personal history of other malignant neoplasm of stomach: Secondary | ICD-10-CM

## 2017-09-03 DIAGNOSIS — R1013 Epigastric pain: Secondary | ICD-10-CM

## 2017-09-03 DIAGNOSIS — Z7901 Long term (current) use of anticoagulants: Secondary | ICD-10-CM

## 2017-09-03 DIAGNOSIS — Z87891 Personal history of nicotine dependence: Secondary | ICD-10-CM | POA: Diagnosis not present

## 2017-09-03 DIAGNOSIS — I110 Hypertensive heart disease with heart failure: Secondary | ICD-10-CM | POA: Diagnosis present

## 2017-09-03 DIAGNOSIS — I5032 Chronic diastolic (congestive) heart failure: Secondary | ICD-10-CM | POA: Diagnosis present

## 2017-09-03 DIAGNOSIS — Z66 Do not resuscitate: Secondary | ICD-10-CM | POA: Diagnosis present

## 2017-09-03 DIAGNOSIS — Z801 Family history of malignant neoplasm of trachea, bronchus and lung: Secondary | ICD-10-CM | POA: Diagnosis not present

## 2017-09-03 DIAGNOSIS — Z8249 Family history of ischemic heart disease and other diseases of the circulatory system: Secondary | ICD-10-CM

## 2017-09-03 DIAGNOSIS — Z515 Encounter for palliative care: Secondary | ICD-10-CM | POA: Diagnosis not present

## 2017-09-03 DIAGNOSIS — K7460 Unspecified cirrhosis of liver: Secondary | ICD-10-CM | POA: Diagnosis present

## 2017-09-03 DIAGNOSIS — C7989 Secondary malignant neoplasm of other specified sites: Principal | ICD-10-CM | POA: Diagnosis present

## 2017-09-03 DIAGNOSIS — E785 Hyperlipidemia, unspecified: Secondary | ICD-10-CM | POA: Diagnosis present

## 2017-09-03 DIAGNOSIS — Z803 Family history of malignant neoplasm of breast: Secondary | ICD-10-CM | POA: Diagnosis not present

## 2017-09-03 LAB — CBC
HCT: 36.4 % (ref 35.0–47.0)
Hemoglobin: 11.8 g/dL — ABNORMAL LOW (ref 12.0–16.0)
MCH: 26.3 pg (ref 26.0–34.0)
MCHC: 32.5 g/dL (ref 32.0–36.0)
MCV: 81 fL (ref 80.0–100.0)
PLATELETS: 292 10*3/uL (ref 150–440)
RBC: 4.49 MIL/uL (ref 3.80–5.20)
RDW: 20.5 % — AB (ref 11.5–14.5)
WBC: 13 10*3/uL — AB (ref 3.6–11.0)

## 2017-09-03 LAB — URINALYSIS, COMPLETE (UACMP) WITH MICROSCOPIC
Bacteria, UA: NONE SEEN
Bilirubin Urine: NEGATIVE
Glucose, UA: NEGATIVE mg/dL
Hgb urine dipstick: NEGATIVE
KETONES UR: NEGATIVE mg/dL
NITRITE: NEGATIVE
PH: 7 (ref 5.0–8.0)
PROTEIN: 30 mg/dL — AB
Specific Gravity, Urine: 1.017 (ref 1.005–1.030)

## 2017-09-03 LAB — COMPREHENSIVE METABOLIC PANEL
ALBUMIN: 2.5 g/dL — AB (ref 3.5–5.0)
ALK PHOS: 93 U/L (ref 38–126)
ALT: 14 U/L (ref 0–44)
AST: 36 U/L (ref 15–41)
Anion gap: 7 (ref 5–15)
BILIRUBIN TOTAL: 0.8 mg/dL (ref 0.3–1.2)
BUN: 29 mg/dL — AB (ref 8–23)
CO2: 28 mmol/L (ref 22–32)
CREATININE: 1.77 mg/dL — AB (ref 0.44–1.00)
Calcium: 9 mg/dL (ref 8.9–10.3)
Chloride: 104 mmol/L (ref 98–111)
GFR calc Af Amer: 29 mL/min — ABNORMAL LOW (ref >60)
GFR, EST NON AFRICAN AMERICAN: 25 mL/min — AB (ref >60)
GLUCOSE: 133 mg/dL — AB (ref 70–99)
Potassium: 3.9 mmol/L (ref 3.5–5.1)
Sodium: 139 mmol/L (ref 135–145)
TOTAL PROTEIN: 6.2 g/dL — AB (ref 6.5–8.1)

## 2017-09-03 LAB — LACTIC ACID, PLASMA
LACTIC ACID, VENOUS: 2 mmol/L — AB (ref 0.5–1.9)
LACTIC ACID, VENOUS: 1.7 mmol/L (ref 0.5–1.9)

## 2017-09-03 LAB — TROPONIN I: Troponin I: <0.03 ng/mL (ref <0.03)

## 2017-09-03 LAB — LIPASE, BLOOD: Lipase: 35 U/L (ref 11–51)

## 2017-09-03 MED ORDER — ONDANSETRON HCL 4 MG/2ML IJ SOLN
4.0000 mg | Freq: Once | INTRAMUSCULAR | Status: AC
  Administered 2017-09-03: 4 mg via INTRAVENOUS
  Filled 2017-09-03: qty 2

## 2017-09-03 MED ORDER — ORAL CARE MOUTH RINSE
15.0000 mL | Freq: Two times a day (BID) | OROMUCOSAL | Status: DC
  Administered 2017-09-03 (×2): 15 mL via OROMUCOSAL
  Filled 2017-09-03 (×2): qty 15

## 2017-09-03 MED ORDER — METRONIDAZOLE IN NACL 5-0.79 MG/ML-% IV SOLN
500.0000 mg | Freq: Three times a day (TID) | INTRAVENOUS | Status: DC
  Administered 2017-09-03: 500 mg via INTRAVENOUS
  Filled 2017-09-03 (×4): qty 100

## 2017-09-03 MED ORDER — FAMOTIDINE 20 MG PO TABS: 20.0000 mg | ORAL_TABLET | Freq: Every day | ORAL | Status: DC | PRN

## 2017-09-03 MED ORDER — DIPHENHYDRAMINE HCL 50 MG/ML IJ SOLN
25.0000 mg | Freq: Once | INTRAMUSCULAR | Status: AC
  Administered 2017-09-03: 25 mg via INTRAVENOUS
  Filled 2017-09-03: qty 1

## 2017-09-03 MED ORDER — ATORVASTATIN CALCIUM 20 MG PO TABS
40.0000 mg | ORAL_TABLET | Freq: Every day | ORAL | Status: DC
  Administered 2017-09-03: 19:00:00 40 mg via ORAL
  Filled 2017-09-03: qty 2

## 2017-09-03 MED ORDER — SODIUM CHLORIDE 0.9 % IV BOLUS
1000.0000 mL | Freq: Once | INTRAVENOUS | Status: AC
  Administered 2017-09-03: 1000 mL via INTRAVENOUS

## 2017-09-03 MED ORDER — FERROUS SULFATE 325 (65 FE) MG PO TABS
325.0000 mg | ORAL_TABLET | Freq: Two times a day (BID) | ORAL | Status: DC
  Administered 2017-09-03 – 2017-09-04 (×2): 325 mg via ORAL
  Filled 2017-09-03 (×2): qty 1

## 2017-09-03 MED ORDER — VANCOMYCIN HCL IN DEXTROSE 1-5 GM/200ML-% IV SOLN
1000.0000 mg | Freq: Once | INTRAVENOUS | Status: AC
  Administered 2017-09-03: 1000 mg via INTRAVENOUS
  Filled 2017-09-03: qty 200

## 2017-09-03 MED ORDER — DOCUSATE SODIUM 100 MG PO CAPS: 100.0000 mg | ORAL_CAPSULE | Freq: Every day | ORAL | Status: DC | PRN

## 2017-09-03 MED ORDER — TRIAMCINOLONE ACETONIDE 0.1 % EX CREA
1.0000 "application " | TOPICAL_CREAM | Freq: Two times a day (BID) | CUTANEOUS | Status: DC | PRN
  Filled 2017-09-03: qty 15

## 2017-09-03 MED ORDER — ACETAMINOPHEN 650 MG RE SUPP: 650.0000 mg | Freq: Four times a day (QID) | RECTAL | Status: DC | PRN

## 2017-09-03 MED ORDER — MORPHINE SULFATE (PF) 2 MG/ML IV SOLN: 2.0000 mg | INTRAVENOUS | Status: DC | PRN

## 2017-09-03 MED ORDER — ONDANSETRON HCL 4 MG PO TABS: 4.0000 mg | ORAL_TABLET | Freq: Four times a day (QID) | ORAL | Status: DC | PRN

## 2017-09-03 MED ORDER — IOPAMIDOL (ISOVUE-300) INJECTION 61%
15.0000 mL | INTRAVENOUS | Status: AC
  Administered 2017-09-03: 15 mL via ORAL

## 2017-09-03 MED ORDER — SODIUM CHLORIDE 0.9 % IV SOLN
2.0000 g | Freq: Once | INTRAVENOUS | Status: AC
  Administered 2017-09-03: 2 g via INTRAVENOUS
  Filled 2017-09-03: qty 2

## 2017-09-03 MED ORDER — ACETAMINOPHEN 325 MG PO TABS: 650.0000 mg | ORAL_TABLET | Freq: Four times a day (QID) | ORAL | Status: DC | PRN

## 2017-09-03 MED ORDER — ONDANSETRON HCL 4 MG/2ML IJ SOLN: 4.0000 mg | Freq: Four times a day (QID) | INTRAMUSCULAR | Status: DC | PRN

## 2017-09-03 MED ORDER — ONDANSETRON HCL 4 MG/2ML IJ SOLN: 4.0000 mg | Freq: Once | INTRAMUSCULAR | Status: DC | PRN

## 2017-09-03 MED ORDER — LORAZEPAM 2 MG/ML IJ SOLN: 1.0000 mg | INTRAMUSCULAR | Status: DC | PRN

## 2017-09-03 MED ORDER — PANTOPRAZOLE SODIUM 40 MG PO TBEC
40.0000 mg | DELAYED_RELEASE_TABLET | Freq: Every day | ORAL | Status: DC
  Administered 2017-09-03 – 2017-09-04 (×2): 40 mg via ORAL
  Filled 2017-09-03 (×2): qty 1

## 2017-09-03 NOTE — ED Notes (Signed)
Patient is complaining of itching. Admitting MD notified.  Will continue to monitor.

## 2017-09-03 NOTE — Progress Notes (Signed)
CODE SEPSIS - PHARMACY COMMUNICATION  **Broad Spectrum Antibiotics should be administered within 1 hour of Sepsis diagnosis**  Time Code Sepsis Called/Page Received: 0724  Antibiotics Ordered: cefepime and vancomycin  Time of 1st antibiotic administration: 0803  Additional action taken by pharmacy:   If necessary, Name of Provider/Nurse Contacted:     Napoleon Form ,PharmD Clinical Pharmacist  09/03/2017  7:25 AM

## 2017-09-03 NOTE — Plan of Care (Signed)
Pt admitted today from the ED. Alert to self. No signs of pain. Pt slept most of the shift. Palliative care consult pending.

## 2017-09-03 NOTE — ED Triage Notes (Signed)
Pt arrived via EMS from home with sudden onset of N/V and upper epigastric chest pain, approx. 2 hours ago. Pt is A&O x4 on arrival to ED. Pt on 2L O2 from home.

## 2017-09-03 NOTE — Progress Notes (Signed)
   09/03/17 1245  Clinical Encounter Type  Visited With Health care provider;Other (Comment)  Visit Type Initial  Referral From Nurse  Consult/Referral To Chaplain  Spiritual Encounters  Spiritual Needs Other (Comment)   [facility] received an OR to complete and AD with Ms. Rudie Meyer. After talking with both nurse on 1C and the ED it was decided that Ms. Mahn is not a candidate for an AD. The patient sufferers from advance dementia. Marland Kitchen

## 2017-09-03 NOTE — ED Notes (Signed)
Patient transported to CT.  Will continue to monitor.   

## 2017-09-03 NOTE — Progress Notes (Signed)
   [place] at [place] [facility] Day: 0 days Mercedes Rodriguez is a 84 y.o. female presenting with Nausea and Emesis .   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  I had an extensive discussion with the patient's son about goals of care and hospice services given her advanced dementia and poor Prognosis.  Patient's son and daughter-in-law in agreement and would like to pursue hospice services at home.  Palliative care consult has been placed, patient likely would benefit from hospice services at home.  Advance care planning discussed with patient  with additional Family at bedside. All questions in regards to overall condition and expected prognosis answered. The decision was made to continue current code status  CODE STATUS: dnr and possible progress to [place] with Hospice Services.   Time spent: 16 minutes

## 2017-09-03 NOTE — H&P (Signed)
[facility] NAME: Mercedes Rodriguez    MR#:  [id number]  DATE OF BIRTH:  06/02/1929  DATE OF ADMISSION:  09/03/2017  PRIMARY CARE PHYSICIAN: Lynnell Jude, MD   REQUESTING/REFERRING PHYSICIAN: Dr. Rudene Re  CHIEF COMPLAINT:   Chief Complaint  Patient presents with  . Nausea  . Emesis    HISTORY OF PRESENT ILLNESS:  Mercedes Rodriguez  is a 84 y.o. female with a known history of chronic atrial fibrillation, CHF, advanced dementia, hypertension, history of a right thigh mass which was suspected to be sarcoma but has not been further worked up for biopsied resents to the hospital from her home due to chest pain, epigastric abdominal pain and pain in her right thigh.  Patient has very vague symptoms therefore most of the history obtained from the son at bedside.  As per the son patient was diagnosed with a mass on her right thigh about a month or so ago and was referred to [facility] for biopsy and she underwent a MRI but has not been further worked up or biopsied since then.  Was suspected to have a right lower extremity sarcoma.  She now presents today with chest pain, abdominal pain and underwent a CT scan of her abdomen pelvis which was suggestive of a sacral mass with splenic mass with some cirrhosis, and patient is thought to have advanced metastatic disease given from her previous mass as noted on the right thigh.   Hospitalist services were contacted for admission.  PAST MEDICAL HISTORY:   Past Medical History:  Diagnosis Date  . Arrhythmia   . Atrial fibrillation ([place])   . CHF (congestive heart failure) ([place])   . Hearing loss   . Hypertension     PAST SURGICAL HISTORY:   Past Surgical History:  Procedure Laterality Date  . COLONOSCOPY N/A 04/12/2017   Procedure: COLONOSCOPY;  Surgeon: Toledo, Benay Pike, MD;  Location: [facility] ENDOSCOPY;  Service: Gastroenterology;  Laterality: N/A;  . ESOPHAGOGASTRODUODENOSCOPY N/A 04/12/2017   Procedure: ESOPHAGOGASTRODUODENOSCOPY (EGD);  Surgeon: Toledo, Benay Pike, MD;  Location: [facility] ENDOSCOPY;  Service: Gastroenterology;  Laterality: N/A;    SOCIAL HISTORY:   Social History   Tobacco Use  . Smoking status: Former Smoker    Types: Cigarettes    Last attempt to quit: 01/06/1993    Years since quitting: 24.6  . Smokeless tobacco: Never Used  Substance Use Topics  . Alcohol use: No    FAMILY HISTORY:   Family History  Problem Relation Age of Onset  . Breast cancer Mother   . Hyperlipidemia Father   . Lung cancer Brother   . Heart attack Brother   . Heart Problems Brother     DRUG ALLERGIES:  No Known Allergies  REVIEW OF SYSTEMS:   Review of Systems  Unable to perform ROS: Mental acuity    MEDICATIONS AT HOME:   Prior to Admission medications   Medication Sig Start Date End Date Taking? Authorizing Provider  acetaminophen (TYLENOL) 325 MG tablet Take 2 tablets (650 mg total) by mouth every 6 (six) hours as needed for mild pain (or Fever >/= 101). 01/07/17  Yes Gouru, Illene Silver, MD  atorvastatin (LIPITOR) 40 MG tablet Take 1 tablet (40 mg total) by mouth daily at 6 PM. 08/16/17  Yes Salary, Holly Bodily D, MD  docusate sodium (COLACE) 100 MG capsule Take 1 capsule (100 mg total) by mouth daily as needed for mild constipation or moderate constipation. Do not take  if you have loose stools 08/04/17  Yes Earlie Server, MD  ferrous sulfate 325 (65 FE) MG EC tablet Take 1 tablet (325 mg total) by mouth 2 (two) times daily with a meal. 08/04/17  Yes Earlie Server, MD  furosemide (LASIX) 40 MG tablet Take 0.5 tablets (20 mg total) by mouth daily. Patient taking differently: Take 40 mg by mouth 2 (two) times daily.  07/25/17  Yes Pyreddy, Reatha Harps, MD  ibuprofen (ADVIL,MOTRIN) 200 MG tablet Take 200-400 mg by mouth every 6 (six) hours as needed for pain or fever. 08/26/17  Yes [provider]  metoprolol succinate (TOPROL-XL) 100 MG 24 hr tablet Take 100 mg by mouth daily. Take with or  immediately following a meal.   Yes [provider]  omeprazole (PRILOSEC) 20 MG capsule Take 20 mg by mouth daily. 08/26/17  Yes [provider]  potassium chloride SA (K-DUR,KLOR-CON) 20 MEQ tablet Take 1 tablet (20 mEq total) by mouth daily. 02/23/17  Yes Wieting, Richard, MD  ranitidine (ZANTAC) 150 MG tablet Take 150 mg by mouth 2 (two) times daily as needed for heartburn.   Yes [provider]  Rivaroxaban (XARELTO) 15 MG TABS tablet Take 1 tablet (15 mg total) by mouth daily with supper. 02/23/17  Yes Loletha Grayer, MD  traZODone (DESYREL) 100 MG tablet Take 1 tablet (100 mg total) by mouth at bedtime as needed for sleep. Patient taking differently: Take 100 mg by mouth at bedtime.  02/23/17  Yes Wieting, Richard, MD  triamcinolone cream (KENALOG) 0.1 % Apply 1 application topically 2 (two) times daily.   Yes [provider]      VITAL SIGNS:  Blood pressure 100/72, pulse (!) 57, temperature 98.3 F (36.8 C), temperature source Oral, resp. rate (!) 21, weight 67 kg, SpO2 100 %.  PHYSICAL EXAMINATION:  Physical Exam  GENERAL:  84 y.o.-year-old patient lying in the bed in no acute distress.  EYES: Pupils equal, round, reactive to light and accommodation. No scleral icterus. Extraocular muscles intact.  HEENT: Head atraumatic, normocephalic. Oropharynx and nasopharynx clear. No oropharyngeal erythema, moist oral mucosa  NECK:  Supple, no jugular venous distention. No thyroid enlargement, no tenderness.  LUNGS: Normal breath sounds bilaterally, no wheezing, rales, rhonchi. No use of accessory muscles of respiration.  CARDIOVASCULAR: S1, S2 RRR. No murmurs, rubs, gallops, clicks.  ABDOMEN: Soft, nontender, nondistended. Bowel sounds present. No organomegaly or mass.  EXTREMITIES: No pedal edema, cyanosis, or clubbing. + 2 pedal & radial pulses b/l.   NEUROLOGIC: Cranial nerves II through XII are intact. No focal Motor or sensory deficits appreciated b/l.  Globally weak.  PSYCHIATRIC: The patient is alert and oriented x 1. SKIN: No obvious rash, lesion, or ulcer.   LABORATORY PANEL:   CBC Recent Labs  Lab 09/03/17 0532  WBC 13.0*  HGB 11.8*  HCT 36.4  PLT 292   ------------------------------------------------------------------------------------------------------------------  Chemistries  Recent Labs  Lab 09/03/17 0532  NA 139  K 3.9  CL 104  CO2 28  GLUCOSE 133*  BUN 29*  CREATININE 1.77*  CALCIUM 9.0  AST 36  ALT 14  ALKPHOS 93  BILITOT 0.8   ------------------------------------------------------------------------------------------------------------------  Cardiac Enzymes Recent Labs  Lab 09/03/17 0532  TROPONINI <0.03   ------------------------------------------------------------------------------------------------------------------  RADIOLOGY:  Ct Abdomen Pelvis Wo Contrast  Result Date: 09/03/2017 CLINICAL DATA:  Acute epigastric/chest pain with nausea and vomiting. History of gastric cancer and right thigh mass. EXAM: CT ABDOMEN AND PELVIS WITHOUT CONTRAST TECHNIQUE: Multidetector CT imaging of the  abdomen and pelvis was performed following the standard protocol without IV contrast. COMPARISON:  Right thigh CT 07/22/2017 and MRI 07/24/2017. No prior imaging of the abdomen and pelvis. FINDINGS: Lower chest: Motion artifact through the lung bases with mild scarring/fibrotic changes. No pleural effusion. Coronary artery atherosclerosis. Hepatobiliary: Punctate calcified granuloma in the liver. Mild nodularity of the liver contour with prominence of the lateral segment of the left hepatic lobe. No significant biliary dilatation status post cholecystectomy. Pancreas: Unremarkable. Spleen: 1.7 cm hypoattenuating lesion posteriorly in the spleen. Adrenals/Urinary Tract: Unremarkable adrenal glands. Punctate right upper pole renal calculi. No hydronephrosis. 2.9 cm exophytic low-density left renal lesion compatible with a  cyst. Unremarkable bladder. Stomach/Bowel: A clip is present in the region of the distal gastric body. There is no evidence of bowel obstruction. The appendix is not clearly identified, however no inflammatory changes are seen about the cecum. Vascular/Lymphatic: Abdominal aortic atherosclerosis without aneurysm. 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 No enlarged lymph nodes. Reproductive: Unremarkable uterus and ovaries. Other: No intraperitoneal free fluid. Small fat containing paraumbilical hernia. Musculoskeletal: There is advanced diffuse thoracic and lumbar disc degeneration as well as advanced lumbar facet arthrosis. No suspicious osseous lesion is identified. There is however an 11 x 5 x 11 cm soft tissue mass posterior to the sacrum which extends superficially to the dermis with deep extension to the posterior margin of the sacrum and coccyx without evidence of associated osseous destruction. This has a similar CT appearance to the right thigh mass, with regions of hypoattenuation which may reflect necrosis. A smaller subcutaneous soft tissue nodule slightly more superiorly and laterally on the right in the low back measures 2.0 cm. IMPRESSION: 1. 11 cm mass posterior to the sacrum, similar in appearance to the right thigh mass and concerning for metastatic disease. 2. Separate 2 cm subcutaneous soft tissue nodule in the right low back. 3. 1.7 cm splenic lesion, indeterminate though metastatic disease is a possibility given the above findings. 4. Cirrhosis and splenic varices. 5. No acute abnormality identified in the abdomen or pelvis. 6. Aortic Atherosclerosis (ICD10-I70.0). Electronically Signed   By: Logan Bores M.D.   On: 09/03/2017 09:33   Dg Chest Portable 1 View  Result Date: 09/03/2017 CLINICAL DATA:  84 y/o F; sudden onset nausea and vomiting as well as upper epigastric and chest pain. EXAM: PORTABLE CHEST 1 VIEW COMPARISON:  08/13/2017 chest radiograph FINDINGS: Stable  cardiomegaly given projection and technique. Aortic atherosclerosis with calcification. Clear lungs. No pleural effusion or pneumothorax. No acute osseous abnormality is evident. Anterior cervical discectomy and fusion hardware noted. IMPRESSION: No active disease. Electronically Signed   By: Kristine Garbe M.D.   On: 09/03/2017 05:49     IMPRESSION AND PLAN:   84 year old female with past medical history of hypertension, hyperlipidemia, dementia, history of atrial fibrillation, history of CHF who presents to the hospital due to chest, epigastric abdominal pain along with some right thigh pain and noted to have an abnormal CT scan of her abdomen pelvis.  1.  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 2.  Advanced dementia 3.  History of atrial fibrillation 4.  Essential hypertension 5.  Hyperlipidemia 6.  GERD  Given her advanced dementia and multiple comorbidities and now with metastatic disease with likely stage IV cancer I had an extensive discussion with the patient's son and daughter-in-law at bedside about goals of care.  They do not want to pursue any aggressive care  at this time regarding diagnosis or even treatment.  Patient will be admitted under comfort care only and to arrange for hospice services at home.  -We will place order for IV morphine as needed for pain, IV Ativan as needed for comfort. -Palliative care consult and also care management consult placed.    All the records are reviewed and case discussed with ED provider. Management plans discussed with the patient, family and they are in agreement.  CODE STATUS: DNR  TOTAL TIME TAKING CARE OF THIS PATIENT: 40 minutes.    Henreitta Leber M.D on 09/03/2017 at 11:03 AM  Between 7am to 6pm - Pager - [phone]  After 6pm go to [email or web address] - password [email or web address] [facility]  [facility]  Office  [phone]  CC: Primary care  physician; Lynnell Jude, MD

## 2017-09-03 NOTE — ED Notes (Signed)
Admitting MD in room to assess patient at this time.  Will continue to monitor.   

## 2017-09-03 NOTE — ED Provider Notes (Signed)
[facility] Emergency Department Provider Note    First MD Initiated Contact with Patient 09/03/17 [phone]     (approximate)  I have reviewed the triage vital signs and the nursing notes.   HISTORY  Chief Complaint Nausea and Emesis    HPI Mercedes Rodriguez is a 84 y.o. female with below list of chronic medical conditions presents to the emergency department via EMS with acute onset of epigastric/chest pain with associated nausea and vomiting which patient states began 2 hours ago.  Patient denies any diarrhea or any urinary symptoms.  Patient denies any fever afebrile on presentation temperature 98.1.  Patient denies any dyspnea.  Past Medical History:  Diagnosis Date  . Arrhythmia   . Atrial fibrillation ([place])   . CHF (congestive heart failure) ([place])   . Hearing loss   . Hypertension     Patient Active Problem List   Diagnosis Date Noted  . Pressure injury of skin 08/15/2017  . Encephalopathy acute 08/15/2017  . Altered mental status 08/13/2017  . Cellulitis of right lower extremity 07/23/2017  . Pain of right lower extremity   . Localized swelling, mass and lump, lower limb   . GIB (gastrointestinal bleeding) 04/09/2017  . Bradycardia 02/21/2017  . Sinus bradycardia 02/20/2017  . HTN (hypertension) 01/29/2017  . Hard of hearing 01/21/2017  . Atrial fibrillation with RVR ([place])   . Palliative care encounter   . CHF (congestive heart failure) ([place]) 12/31/2016    Past Surgical History:  Procedure Laterality Date  . COLONOSCOPY N/A 04/12/2017   Procedure: COLONOSCOPY;  Surgeon: Toledo, Benay Pike, MD;  Location: [facility] ENDOSCOPY;  Service: Gastroenterology;  Laterality: N/A;  . ESOPHAGOGASTRODUODENOSCOPY N/A 04/12/2017   Procedure: ESOPHAGOGASTRODUODENOSCOPY (EGD);  Surgeon: Toledo, Benay Pike, MD;  Location: [facility] ENDOSCOPY;  Service: Gastroenterology;  Laterality: N/A;    Prior to Admission medications   Medication Sig Start Date End Date Taking?  Authorizing Provider  acetaminophen (TYLENOL) 325 MG tablet Take 2 tablets (650 mg total) by mouth every 6 (six) hours as needed for mild pain (or Fever >/= 101). 01/07/17   [name], MD  atorvastatin (LIPITOR) 40 MG tablet Take 1 tablet (40 mg total) by mouth daily at 6 PM. 08/16/17   Salary, Avel Peace, MD  docusate sodium (COLACE) 100 MG capsule Take 1 capsule (100 mg total) by mouth daily as needed for mild constipation or moderate constipation. Do not take if you have loose stools 08/04/17   Earlie Server, MD  ferrous sulfate 325 (65 FE) MG EC tablet Take 1 tablet (325 mg total) by mouth 2 (two) times daily with a meal. 08/04/17   Earlie Server, MD  furosemide (LASIX) 40 MG tablet Take 0.5 tablets (20 mg total) by mouth daily. 07/25/17   Saundra Shelling, MD  metoprolol succinate (TOPROL-XL) 100 MG 24 hr tablet Take 100 mg by mouth daily. Take with or immediately following a meal.    [provider]  potassium chloride SA (K-DUR,KLOR-CON) 20 MEQ tablet Take 1 tablet (20 mEq total) by mouth daily. 02/23/17   Loletha Grayer, MD  ranitidine (ZANTAC) 150 MG tablet Take 150 mg by mouth 2 (two) times daily as needed for heartburn.    [provider]  Rivaroxaban (XARELTO) 15 MG TABS tablet Take 1 tablet (15 mg total) by mouth daily with supper. 02/23/17   Loletha Grayer, MD  traZODone (DESYREL) 100 MG tablet Take 1 tablet (100 mg total) by mouth at bedtime as needed for sleep. Patient  taking differently: Take 100 mg by mouth at bedtime.  02/23/17   Loletha Grayer, MD  triamcinolone cream (KENALOG) 0.1 % Apply 1 application topically 2 (two) times daily.    [provider]    Allergies Patient has no known allergies.  Family History  Problem Relation Age of Onset  . Breast cancer Mother   . Hyperlipidemia Father   . Lung cancer Brother   . Heart attack Brother   . Heart Problems Brother     Social History Social History   Tobacco Use  . Smoking status: Former Smoker     Types: Cigarettes    Last attempt to quit: 01/06/1993    Years since quitting: 24.6  . Smokeless tobacco: Never Used  Substance Use Topics  . Alcohol use: No  . Drug use: Never    Review of Systems Constitutional: No fever/chills Eyes: No visual changes. ENT: No sore throat. Cardiovascular: Positive for chest pain. Respiratory: Denies shortness of breath. Gastrointestinal: Positive for abdominal pain nausea vomiting. Genitourinary: Negative for dysuria. Musculoskeletal: Negative for neck pain.  Negative for back pain. Integumentary: Negative for rash. Neurological: Negative for headaches, focal weakness or numbness.  ____________________________________________   PHYSICAL EXAM:  VITAL SIGNS: ED Triage Vitals [09/03/17 0534]  Enc Vitals Group     BP 99/75     Pulse Rate (!) 114     Resp 18     Temp 98.1 F (36.7 C)     Temp Source Oral     SpO2 100 %     Weight      Height      Head Circumference      Peak Flow      Pain Score      Pain Loc      Pain Edu?      Excl. in [place]?     Constitutional: Alert and oriented. Well appearing and in no acute distress. Eyes: Conjunctivae are normal.  Head: Atraumatic. Mouth/Throat: Mucous membranes are moist.  Oropharynx non-erythematous. Neck: No stridor.   Cardiovascular: Normal rate, regular rhythm. Good peripheral circulation. Grossly normal heart sounds. Respiratory: Normal respiratory effort.  No retractions. Lungs CTAB. Gastrointestinal: Right upper quadrant epigastric and right lower quadrant tenderness to palpation.  No distention. Musculoskeletal: No lower extremity tenderness nor edema. No gross deformities of extremities. Neurologic:  Normal speech and language. No gross focal neurologic deficits are appreciated.  Skin:  Skin is warm, dry and intact. No rash noted. Psychiatric: Mood and affect are normal. Speech and behavior are normal.  ____________________________________________   LABS (all labs ordered are  listed, but only abnormal results are displayed)  Labs Reviewed  COMPREHENSIVE METABOLIC PANEL - Abnormal; Notable for the following components:      Result Value   Glucose, Bld 133 (*)    BUN 29 (*)    Creatinine, Ser 1.77 (*)    Total Protein 6.2 (*)    Albumin 2.5 (*)    GFR calc non Af Amer 25 (*)    GFR calc Af Amer 29 (*)    All other components within normal limits  CBC - Abnormal; Notable for the following components:   WBC 13.0 (*)    Hemoglobin 11.8 (*)    RDW 20.5 (*)    All other components within normal limits  LIPASE, BLOOD  TROPONIN I  URINALYSIS, COMPLETE (UACMP) WITH MICROSCOPIC   ____________________________________________  EKG  ED ECG REPORT I, [place] N BROWN, the attending physician, personally viewed and interpreted  this ECG.   Date: 09/03/2017  EKG Time: 6:16 AM  Rate: 107  Rhythm: Atrial fibrillation with rapid ventricular response  Axis: Normal  Intervals: Normal  ST&T Change: None  ____________________________________________  RADIOLOGY I, [place] N BROWN, personally viewed and evaluated these images (plain radiographs) as part of my medical decision making, as well as reviewing the written report by the radiologist.  ED MD interpretation:    Official radiology report(s): Dg Chest Portable 1 View  Result Date: 09/03/2017 CLINICAL DATA:  84 y/o F; sudden onset nausea and vomiting as well as upper epigastric and chest pain. EXAM: PORTABLE CHEST 1 VIEW COMPARISON:  08/13/2017 chest radiograph FINDINGS: Stable cardiomegaly given projection and technique. Aortic atherosclerosis with calcification. Clear lungs. No pleural effusion or pneumothorax. No acute osseous abnormality is evident. Anterior cervical discectomy and fusion hardware noted. IMPRESSION: No active disease. Electronically Signed   By: Kristine Garbe M.D.   On: 09/03/2017 05:49      Procedures   ____________________________________________   INITIAL IMPRESSION /  ASSESSMENT AND PLAN / ED COURSE  As part of my medical decision making, I reviewed the following data within the electronic MEDICAL RECORD NUMBER   84 year old female presenting with above-stated history and physical exam secondary to epigastric right upper quadrant/chest discomfort.  Consider the possibility of ACS and as such EKG was performed which revealed no evidence of ischemia or infarction.  Laboratory data notable for leukocytosis of 13 BUN and creatinine elevated however at patient's baseline.  Concern for possible intra-abdominal pathology as well notably gallbladder/appendix/colon pathology and as such CT scan of the abdomen pending.  Patient's care transferred to Dr. Alfred Levins    ____________________________________________  FINAL CLINICAL IMPRESSION(S) / ED DIAGNOSES  Final diagnoses:  Chest pain, unspecified type  Epigastric pain     MEDICATIONS GIVEN DURING THIS VISIT:  Medications  ondansetron (ZOFRAN) injection 4 mg (has no administration in time range)  ondansetron (ZOFRAN) injection 4 mg (4 mg Intravenous Given 09/03/17 0601)     ED Discharge Orders    None       Note:  This document was prepared using Dragon voice recognition software and may include unintentional dictation errors.    Gregor Hams, MD 09/03/17 [phone]

## 2017-09-03 NOTE — Progress Notes (Signed)
Pharmacy Antibiotic Note  Mercedes Rodriguez is a 84 y.o. female admitted on 09/03/2017 with sepsis.  Pharmacy has been consulted for cefepime and vancomycin dosing.  Plan: Patient received doses of antibiotics in ED. On admission patient has switched to full comfort care (MD reported discussion with son, palliative consult pending). Do not continue antibiotics at this time.   Weight: 147 lb 11.3 oz (67 kg)  Temp (24hrs), Avg:98.1 F (36.7 C), Min:97.8 F (36.6 C), Max:98.3 F (36.8 C)  Recent Labs  Lab 09/03/17 0532 09/03/17 0755 09/03/17 0920  WBC 13.0*  --   --   CREATININE 1.77*  --   --   LATICACIDVEN  --  2.0* 1.7    Estimated Creatinine Clearance: 19.8 mL/min (A) (by C-G formula based on SCr of 1.77 mg/dL (H)).    No Known Allergies  Antimicrobials this admission:   Dose adjustments this admission:   Microbiology results:  BCx:   UCx:    Sputum:    MRSA PCR:   Thank you for allowing pharmacy to be a part of this patient's care.  Laural Benes, Pharm.D., BCPS Clinical Pharmacist 09/03/2017 12:35 PM

## 2017-09-03 NOTE — ED Notes (Signed)
Only 1 set of blood cultures was drawn.  2nd set was attempted unsuccessfully in patient's right forearm.  I did not attempt again due to sepsis time frame and patient being a difficult stick.

## 2017-09-04 DIAGNOSIS — C799 Secondary malignant neoplasm of unspecified site: Secondary | ICD-10-CM

## 2017-09-04 DIAGNOSIS — Z515 Encounter for palliative care: Secondary | ICD-10-CM

## 2017-09-04 MED ORDER — HYDROXYZINE HCL 10 MG PO TABS
10.0000 mg | ORAL_TABLET | Freq: Three times a day (TID) | ORAL | Status: DC | PRN
  Administered 2017-09-04: 10 mg via ORAL
  Filled 2017-09-04 (×3): qty 1

## 2017-09-04 MED ORDER — LORAZEPAM 0.5 MG PO TABS: 0.5000 mg | ORAL_TABLET | Freq: Three times a day (TID) | ORAL | 0 refills | Status: AC | PRN

## 2017-09-04 MED ORDER — HYDROXYZINE HCL 10 MG PO TABS: 10.0000 mg | ORAL_TABLET | Freq: Three times a day (TID) | ORAL | 0 refills | Status: AC | PRN

## 2017-09-04 MED ORDER — MORPHINE SULFATE (CONCENTRATE) 10 MG/0.5ML PO SOLN: 10.0000 mg | ORAL | 0 refills | Status: AC | PRN

## 2017-09-04 NOTE — Progress Notes (Signed)
EMS called for transporation

## 2017-09-04 NOTE — Care Management Note (Signed)
Case Management Note  Patient Details  Name: Mercedes Rodriguez MRN: [id number] Date of Birth: August 18, 1929  Subjective/Objective:   Consult received from Attending for home hospice services. Patient lives at home with her son. Grand daughter, [name] listed as contact person on chart. TC to carla. [name] states. Patient lives with her son. Son works. He comes home and drink and often leaves. Granddaughter prefers hospice of A/C. Referral to [facility]. Patient has O2, BSC and WC. Hospice will get DME changed to [facility]. Patient will need to transport by EMS. Medical Necessity Completed.   Action/Plan:   Expected Discharge Date:  09/04/17               Expected Discharge Plan:  Home w Hospice Care  In-House Referral:     Discharge planning Services  CM Consult  Post Acute Care Choice:  Durable Medical Equipment, Hospice Choice offered to:  [name], [name])  DME Arranged:    DME Agency:  [facility]  HH Arranged:  Disease Management [place] Agency:  [facility]  Status of Service:  Completed, signed off  If discussed at [place] of Stay Meetings, dates discussed:    Additional Comments:  Jolly Mango, RN 09/04/2017, 3:34 PM

## 2017-09-04 NOTE — Progress Notes (Signed)
[facility] [place] NAME: Mercedes Rodriguez    MR#:  [id number]  DATE OF BIRTH:  1929/01/24  SUBJECTIVE:  CHIEF COMPLAINT:   Chief Complaint  Patient presents with  . Nausea  . Emesis   - lying in bed, pleasantly confused Likely discharge home with hospice today - Family at bedside  REVIEW OF SYSTEMS:  Review of Systems  Unable to perform ROS: Dementia    DRUG ALLERGIES:  No Known Allergies  VITALS:  Blood pressure 116/78, pulse 97, temperature 98.3 F (36.8 C), temperature source Axillary, resp. rate 18, weight 67 kg, SpO2 99 %.  PHYSICAL EXAMINATION:  Physical Exam   GENERAL:  84 y.o.-year-old  Elderly patient lying in the bed with no acute distress.  EYES: Pupils equal, round, reactive to light and accommodation. No scleral icterus. Extraocular muscles intact.  HEENT: Head atraumatic, normocephalic. Oropharynx and nasopharynx clear.  NECK:  Supple, no jugular venous distention. No thyroid enlargement, no tenderness.  LUNGS: Normal breath sounds bilaterally, no wheezing, rales,rhonchi or crepitation. No use of accessory muscles of respiration. Decreased at the bases CARDIOVASCULAR: S1, S2 normal. No  rubs, or gallops. 2/6 systolic murmur present ABDOMEN: Soft, nontender, nondistended. Bowel sounds present. No organomegaly or mass.  EXTREMITIES: right inner thigh mass noted. No pedal edema, cyanosis, or clubbing.  NEUROLOGIC: Cranial nerves II through XII are intact. Muscle strength 5/5 in all extremities. Sensation intact. Gait not checked.  PSYCHIATRIC: The patient is alert and oriented x 1.  SKIN: No obvious rash, lesion, or ulcer.    LABORATORY PANEL:   CBC Recent Labs  Lab 09/03/17 0532  WBC 13.0*  HGB 11.8*  HCT 36.4  PLT 292   ------------------------------------------------------------------------------------------------------------------  Chemistries  Recent Labs  Lab 09/03/17 0532  NA 139  K 3.9  CL  104  CO2 28  GLUCOSE 133*  BUN 29*  CREATININE 1.77*  CALCIUM 9.0  AST 36  ALT 14  ALKPHOS 93  BILITOT 0.8   ------------------------------------------------------------------------------------------------------------------  Cardiac Enzymes Recent Labs  Lab 09/03/17 0532  TROPONINI <0.03   ------------------------------------------------------------------------------------------------------------------  RADIOLOGY:  Ct Abdomen Pelvis Wo Contrast  Result Date: 09/03/2017 CLINICAL DATA:  Acute epigastric/chest pain with nausea and vomiting. History of gastric cancer and right thigh mass. EXAM: CT ABDOMEN AND PELVIS WITHOUT CONTRAST TECHNIQUE: Multidetector CT imaging of the abdomen and pelvis was performed following the standard protocol without IV contrast. COMPARISON:  Right thigh CT 07/22/2017 and MRI 07/24/2017. No prior imaging of the abdomen and pelvis. FINDINGS: Lower chest: Motion artifact through the Rodriguez bases with mild scarring/fibrotic changes. No pleural effusion. Coronary artery atherosclerosis. Hepatobiliary: Punctate calcified granuloma in the liver. Mild nodularity of the liver contour with prominence of the lateral segment of the left hepatic lobe. No significant biliary dilatation status post cholecystectomy. Pancreas: Unremarkable. Spleen: 1.7 cm hypoattenuating lesion posteriorly in the spleen. Adrenals/Urinary Tract: Unremarkable adrenal glands. Punctate right upper pole renal calculi. No hydronephrosis. 2.9 cm exophytic low-density left renal lesion compatible with a cyst. Unremarkable bladder. Stomach/Bowel: A clip is present in the region of the distal gastric body. There is no evidence of bowel obstruction. The appendix is not clearly identified, however no inflammatory changes are seen about the cecum. Vascular/Lymphatic: Abdominal aortic atherosclerosis without aneurysm. 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 No enlarged lymph nodes. Reproductive:  Unremarkable uterus and ovaries. Other: No intraperitoneal free fluid. Small fat containing paraumbilical hernia. Musculoskeletal: There is advanced diffuse thoracic and lumbar disc degeneration  as well as advanced lumbar facet arthrosis. No suspicious osseous lesion is identified. There is however an 11 x 5 x 11 cm soft tissue mass posterior to the sacrum which extends superficially to the dermis with deep extension to the posterior margin of the sacrum and coccyx without evidence of associated osseous destruction. This has a similar CT appearance to the right thigh mass, with regions of hypoattenuation which may reflect necrosis. A smaller subcutaneous soft tissue nodule slightly more superiorly and laterally on the right in the low back measures 2.0 cm. IMPRESSION: 1. 11 cm mass posterior to the sacrum, similar in appearance to the right thigh mass and concerning for metastatic disease. 2. Separate 2 cm subcutaneous soft tissue nodule in the right low back. 3. 1.7 cm splenic lesion, indeterminate though metastatic disease is a possibility given the above findings. 4. Cirrhosis and splenic varices. 5. No acute abnormality identified in the abdomen or pelvis. 6. Aortic Atherosclerosis (ICD10-I70.0). Electronically Signed   By: Logan Bores M.D.   On: 09/03/2017 09:33   Dg Chest Portable 1 View  Result Date: 09/03/2017 CLINICAL DATA:  84 y/o F; sudden onset nausea and vomiting as well as upper epigastric and chest pain. EXAM: PORTABLE CHEST 1 VIEW COMPARISON:  08/13/2017 chest radiograph FINDINGS: Stable cardiomegaly given projection and technique. Aortic atherosclerosis with calcification. Clear lungs. No pleural effusion or pneumothorax. No acute osseous abnormality is evident. Anterior cervical discectomy and fusion hardware noted. IMPRESSION: No active disease. Electronically Signed   By: Kristine Garbe M.D.   On: 09/03/2017 05:49    EKG:   Orders placed or performed during the hospital  encounter of 09/03/17  . ED EKG  . ED EKG  . EKG 12-Lead  . EKG 12-Lead  . ED EKG 12-Lead  . ED EKG 12-Lead    ASSESSMENT AND PLAN:   84 year old female with dementia, A. fib on Xarelto, hypertension, hyperlipidemia and CHF brought to the hospital secondary to chest pain and abdominal pain.  1.  Chest pain abdominal pain-nonspecific.  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 -Family decided not to pursue further work-up and treatment at this time given her dementia. -Pain control recommended. -Seen oncology as outpatient.  -Overall poor prognosis. -Appreciate palliative care consult.  Plan is to discharge home with hospice services  2.  Atrial fibrillation-rate controlled.  If going home with hospice, will discontinue Xarelto  3.  Hypertension-blood pressure is stable.  Awaiting to hear from son who is the primary decision maker, likely plan to discharge home with hospice services   All the records are reviewed and case discussed with Care Management/Social Workerr. Management plans discussed with the patient, family and they are in agreement.  CODE STATUS: DNR  TOTAL TIME TAKING CARE OF THIS PATIENT: 38 minutes.   POSSIBLE D/C IN 1-2 DAYS, DEPENDING ON CLINICAL CONDITION.   Stella Encarnacion M.D on 09/04/2017 at 12:03 PM  Between 7am to 6pm - Pager - [phone]  After 6pm go to [email or web address] - password [email or web address] [facility]  Office  [phone]  CC: Primary care physician; Lynnell Jude, MD

## 2017-09-04 NOTE — Progress Notes (Signed)
Nutrition Brief Note  Chart reviewed. Pt now transitioning to comfort care.  No further nutrition interventions warranted at this time.  Please re-consult as needed.   Oluwatosin Higginson A. Daniyah Fohl, RD, LDN, CDE Pager: [phone] After hours Pager: [phone]  

## 2017-09-04 NOTE — Discharge Summary (Signed)
[facility] at [place] NAME: Mercedes Rodriguez    MR#:  [id number]  DATE OF BIRTH:  1929-09-26  DATE OF ADMISSION:  09/03/2017   ADMITTING PHYSICIAN: Henreitta Leber, MD  DATE OF DISCHARGE: 09/04/17  PRIMARY CARE PHYSICIAN: Lynnell Jude, MD   ADMISSION DIAGNOSIS:   Epigastric pain [R10.13] Metastatic cancer ([place]) [C79.9] Chest pain, unspecified type [R07.9]  DISCHARGE DIAGNOSIS:   Active Problems:   Metastatic carcinoma ([place])   Metastatic cancer ([place])   SECONDARY DIAGNOSIS:   Past Medical History:  Diagnosis Date  . Arrhythmia   . Atrial fibrillation ([place])   . CHF (congestive heart failure) ([place])   . Hearing loss   . Hypertension     HOSPITAL COURSE:   84 year old female with dementia, A. fib on Xarelto, hypertension, hyperlipidemia and CHF brought to the hospital secondary to chest pain and abdominal pain.  1.  Chest pain abdominal pain-nonspecific.  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 -Family decided not to pursue further work-up and treatment at this time given her dementia. -Pain control recommended. -Seen oncology as outpatient.  -Overall poor prognosis. -Appreciate palliative care consult.  Plan is to discharge home with hospice services  2.  Atrial fibrillation-rate controlled. Since  going home with hospice, will discontinue Xarelto On toprol for rate control  3.  Hypertension-blood pressure is stable. On toprol  4. GERD- prilosec and ranitidine  5. Dementia- advanced, pleasantly confused, recognizes family  Awaiting to hear from son who is the primary decision maker, likely plan to discharge home with hospice services  DISCHARGE CONDITIONS:   Guarded  CONSULTS OBTAINED:   Palliative Care consult  DRUG ALLERGIES:   No Known Allergies DISCHARGE MEDICATIONS:   Allergies as of 09/04/2017   No Known Allergies     Medication  List    STOP taking these medications   atorvastatin 40 MG tablet Commonly known as:  LIPITOR   docusate sodium 100 MG capsule Commonly known as:  COLACE   ferrous sulfate 325 (65 FE) MG EC tablet   ibuprofen 200 MG tablet Commonly known as:  ADVIL,MOTRIN   potassium chloride SA 20 MEQ tablet Commonly known as:  K-DUR,KLOR-CON   Rivaroxaban 15 MG Tabs tablet Commonly known as:  XARELTO     TAKE these medications   acetaminophen 325 MG tablet Commonly known as:  TYLENOL Take 2 tablets (650 mg total) by mouth every 6 (six) hours as needed for mild pain (or Fever >/= 101).   furosemide 40 MG tablet Commonly known as:  LASIX Take 0.5 tablets (20 mg total) by mouth daily. What changed:    how much to take  when to take this   hydrOXYzine 10 MG tablet Commonly known as:  ATARAX/VISTARIL Take 1 tablet (10 mg total) by mouth 3 (three) times daily as needed for itching.   LORazepam 0.5 MG tablet Commonly known as:  ATIVAN Take 1 tablet (0.5 mg total) by mouth every 8 (eight) hours as needed for anxiety.   metoprolol succinate 100 MG 24 hr tablet Commonly known as:  TOPROL-XL Take 100 mg by mouth daily. Take with or immediately following a meal.   morphine CONCENTRATE 10 MG/0.5ML Soln concentrated solution Take 0.5 mLs (10 mg total) by mouth every 2 (two) hours as needed for moderate pain or severe pain.   omeprazole 20 MG capsule Commonly known as:  PRILOSEC Take 20  mg by mouth daily.   ranitidine 150 MG tablet Commonly known as:  ZANTAC Take 150 mg by mouth 2 (two) times daily as needed for heartburn.   traZODone 100 MG tablet Commonly known as:  DESYREL Take 1 tablet (100 mg total) by mouth at bedtime as needed for sleep. What changed:  when to take this   triamcinolone cream 0.1 % Commonly known as:  KENALOG Apply 1 application topically 2 (two) times daily.        DISCHARGE INSTRUCTIONS:   1. Patient being discharged home with hospice  services  DIET:   Regular diet  ACTIVITY:   Activity as tolerated  OXYGEN:   Home Oxygen: No.  Oxygen Delivery: room air  DISCHARGE LOCATION:   home with hospice services  If you experience worsening of your admission symptoms, develop shortness of breath, life threatening emergency, suicidal or homicidal thoughts you must seek medical attention immediately by calling 911 or calling your MD immediately  if symptoms less severe.  You Must read complete instructions/literature along with all the possible adverse reactions/side effects for all the Medicines you take and that have been prescribed to you. Take any new Medicines after you have completely understood and accpet all the possible adverse reactions/side effects.   Please note  You were cared for by a hospitalist during your hospital stay. If you have any questions about your discharge medications or the care you received while you were in the hospital after you are discharged, you can call the unit and asked to speak with the hospitalist on call if the hospitalist that took care of you is not available. Once you are discharged, your primary care physician will handle any further medical issues. Please note that NO REFILLS for any discharge medications will be authorized once you are discharged, as it is imperative that you return to your primary care physician (or establish a relationship with a primary care physician if you do not have one) for your aftercare needs so that they can reassess your need for medications and monitor your lab values.    On the day of Discharge:  VITAL SIGNS:   Blood pressure 108/71, pulse 78, temperature 98.5 F (36.9 C), temperature source Oral, resp. rate 20, weight 67 kg, SpO2 99 %.  PHYSICAL EXAMINATION:   GENERAL:  84 y.o.-year-old  Elderly patient lying in the bed with no acute distress.  EYES: Pupils equal, round, reactive to light and accommodation. No scleral icterus. Extraocular  muscles intact.  HEENT: Head atraumatic, normocephalic. Oropharynx and nasopharynx clear.  NECK:  Supple, no jugular venous distention. No thyroid enlargement, no tenderness.  LUNGS: Normal breath sounds bilaterally, no wheezing, rales,rhonchi or crepitation. No use of accessory muscles of respiration. Decreased at the bases CARDIOVASCULAR: S1, S2 normal. No  rubs, or gallops. 2/6 systolic murmur present ABDOMEN: Soft, nontender, nondistended. Bowel sounds present. No organomegaly or mass.  EXTREMITIES: right inner thigh mass noted. No pedal edema, cyanosis, or clubbing.  NEUROLOGIC: Cranial nerves II through XII are intact. Muscle strength 5/5 in all extremities. Sensation intact. Gait not checked.  PSYCHIATRIC: The patient is alert and oriented x 1.  SKIN: No obvious rash, lesion, or ulcer.   DATA REVIEW:   CBC Recent Labs  Lab 09/03/17 0532  WBC 13.0*  HGB 11.8*  HCT 36.4  PLT 292    Chemistries  Recent Labs  Lab 09/03/17 0532  NA 139  K 3.9  CL 104  CO2 28  GLUCOSE 133*  BUN  29*  CREATININE 1.77*  CALCIUM 9.0  AST 36  ALT 14  ALKPHOS 93  BILITOT 0.8     Microbiology Results  Results for orders placed or performed during the hospital encounter of 09/03/17  Blood Culture (routine x 2)     Status: None (Preliminary result)   Collection Time: 09/03/17  7:20 AM  Result Value Ref Range Status   Specimen Description BLOOD RIGHT ARM  Final   Special Requests   Final    BOTTLES DRAWN AEROBIC AND ANAEROBIC Blood Culture results may not be optimal due to an excessive volume of blood received in culture bottles   Culture   Final    NO GROWTH < 24 HOURS Performed at [facility], [address], [place], [place] [address]    Report Status PENDING  Incomplete    RADIOLOGY:  No results found.   Management plans discussed with the patient, family and they are in agreement.  CODE STATUS:     Code Status Orders  (From admission, onward)         Start      Ordered   09/03/17 1221  Do not attempt resuscitation (DNR)  Continuous    Question Answer Comment  In the event of cardiac or respiratory ARREST Do not call a "code blue"   In the event of cardiac or respiratory ARREST Do not perform Intubation, CPR, defibrillation or ACLS   In the event of cardiac or respiratory ARREST Use medication by any route, position, wound care, and other measures to relive pain and suffering. May use oxygen, suction and manual treatment of airway obstruction as needed for comfort.   Comments RN may pronounce      09/03/17 1220        Code Status History    Date Active Date Inactive Code Status Order ID Comments User Context   08/13/2017 1530 08/16/2017 1823 DNR [id number]  Epifanio Lesches, MD ED   07/23/2017 0158 07/25/2017 1853 DNR [id number]  Arta Silence, MD Inpatient   04/09/2017 1744 04/13/2017 1840 DNR [id number]  Gorden Harms, MD Inpatient   02/20/2017 1625 02/23/2017 1449 DNR [id number]  [name], MD Inpatient   12/31/2016 1206 01/08/2017 1750 DNR [id number]  Bettey Costa, MD Inpatient    Advance Directive Documentation     Most Recent Value  Type of Advance Directive  -- [family unsure, granddaughter confirmed]  Pre-existing out of facility DNR order (yellow form or pink MOST form)  -  "MOST" Form in Place?  -      TOTAL TIME TAKING CARE OF THIS PATIENT: 38 minutes.    Dartha Rozzell M.D on 09/04/2017 at 3:12 PM  Between 7am to 6pm - Pager - [phone]  After 6pm go to [email or web address] - Proofreader  [facility]  Office  [phone]  CC: Primary care physician; Lynnell Jude, MD   Note: This dictation was prepared with Dragon dictation along with smaller phrase technology. Any transcriptional errors that result from this process are unintentional.

## 2017-09-04 NOTE — Consult Note (Signed)
Consultation Note Date: 09/04/2017   Patient Name: Mercedes Rodriguez  DOB: [birth year]  MRN: [id number]  Age / Sex: 84 y.o., female  PCP: Lynnell Jude, MD Referring Physician: Gladstone Lighter, MD  Reason for Consultation: Establishing goals of care  HPI/Patient Profile: 84 y.o. 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 She was again hospitalized 08/13/17 to 08/16/17 with AMS, workup was unrevealing for etiology and confusion was thought to be secondary to progression of dementia. She is now readmitted on 09/03/2017 with chest, abdominal and thigh pain. Abdominal CT was suggestive of metastatic disease with masses noted in the spleen and posterior sacrum. She was also noted to have cirrhosis. Palliative care has been asked to help clarify goals.   Clinical Assessment and Goals of Care: Patient is pleasantly confused. She does not appear acutely symptomatic. No family present.   I tried calling patient's son, [name], but was not successful. I have asked the nurse to call me if he arrives to the hospital this morning.   I called and spoke with this hospital liaison for [facility]. Patient was followed by hospice 01/2017 to 03/2017 but discharged due to stability. Patient has also been followed by palliative care in the community.   I would agree with hospice involvement given presence of probable metastatic disease. CM consult is pending to help with disposition.   SUMMARY OF RECOMMENDATIONS   1. Continue supportive care 2. Agree with referral for hospice care     Primary Diagnoses: Present on Admission: . Metastatic carcinoma ([place])   I have reviewed  the medical record, interviewed the patient and family, and examined the patient. The following aspects are pertinent.  Past Medical History:  Diagnosis Date  . Arrhythmia   . Atrial fibrillation ([place])   . CHF (congestive heart failure) ([place])   . Hearing loss   . Hypertension    Social History   Socioeconomic History  . Marital status: Widowed    Spouse name: Not on file  . Number of children: Not on file  . Years of education: Not on file  . Highest education level: Not on file  Occupational History  . Not on file  Social Needs  . Financial resource strain: Not on file  . Food insecurity:    Worry: Not on file    Inability: Not on file  . Transportation needs:    Medical: Not on file    Non-medical: Not on file  Tobacco Use  . Smoking status: Former Smoker    Types: Cigarettes    Last attempt to quit: 01/06/1993    Years since quitting: 24.6  . Smokeless tobacco: Never Used  Substance and Sexual Activity  . Alcohol use: No  . Drug use: Never  . Sexual activity: Not Currently  Lifestyle  .  Physical activity:    Days per week: Not on file    Minutes per session: Not on file  . Stress: Not on file  Relationships  . Social connections:    Talks on phone: Not on file    Gets together: Not on file    Attends religious service: Not on file    Active member of club or organization: Not on file    Attends meetings of clubs or organizations: Not on file    Relationship status: Not on file  Other Topics Concern  . Not on file  Social History Narrative  . Not on file   Family History  Problem Relation Age of Onset  . Breast cancer Mother   . Hyperlipidemia Father   . Lung cancer Brother   . Heart attack Brother   . Heart Problems Brother    Scheduled Meds: . atorvastatin  40 mg Oral q1800  . ferrous sulfate  325 mg Oral BID WC  . mouth rinse  15 mL Mouth Rinse BID  . pantoprazole  40 mg Oral Daily   Continuous Infusions: PRN Meds:.acetaminophen **OR**  acetaminophen, docusate sodium, famotidine, LORazepam, morphine injection, ondansetron (ZOFRAN) IV, ondansetron **OR** ondansetron (ZOFRAN) IV, triamcinolone cream Medications Prior to Admission:  Prior to Admission medications   Medication Sig Start Date End Date Taking? Authorizing Provider  acetaminophen (TYLENOL) 325 MG tablet Take 2 tablets (650 mg total) by mouth every 6 (six) hours as needed for mild pain (or Fever >/= 101). 01/07/17  Yes Gouru, Illene Silver, MD  atorvastatin (LIPITOR) 40 MG tablet Take 1 tablet (40 mg total) by mouth daily at 6 PM. 08/16/17  Yes Salary, Holly Bodily D, MD  docusate sodium (COLACE) 100 MG capsule Take 1 capsule (100 mg total) by mouth daily as needed for mild constipation or moderate constipation. Do not take if you have loose stools 08/04/17  Yes Earlie Server, MD  ferrous sulfate 325 (65 FE) MG EC tablet Take 1 tablet (325 mg total) by mouth 2 (two) times daily with a meal. 08/04/17  Yes Earlie Server, MD  furosemide (LASIX) 40 MG tablet Take 0.5 tablets (20 mg total) by mouth daily. Patient taking differently: Take 40 mg by mouth 2 (two) times daily.  07/25/17  Yes Pyreddy, Reatha Harps, MD  ibuprofen (ADVIL,MOTRIN) 200 MG tablet Take 200-400 mg by mouth every 6 (six) hours as needed for pain or fever. 08/26/17  Yes [provider]  metoprolol succinate (TOPROL-XL) 100 MG 24 hr tablet Take 100 mg by mouth daily. Take with or immediately following a meal.   Yes [provider]  omeprazole (PRILOSEC) 20 MG capsule Take 20 mg by mouth daily. 08/26/17  Yes [provider]  potassium chloride SA (K-DUR,KLOR-CON) 20 MEQ tablet Take 1 tablet (20 mEq total) by mouth daily. 02/23/17  Yes Wieting, Richard, MD  ranitidine (ZANTAC) 150 MG tablet Take 150 mg by mouth 2 (two) times daily as needed for heartburn.   Yes [provider]  Rivaroxaban (XARELTO) 15 MG TABS tablet Take 1 tablet (15 mg total) by mouth daily with supper. 02/23/17  Yes Loletha Grayer, MD  traZODone  (DESYREL) 100 MG tablet Take 1 tablet (100 mg total) by mouth at bedtime as needed for sleep. Patient taking differently: Take 100 mg by mouth at bedtime.  02/23/17  Yes Wieting, Richard, MD  triamcinolone cream (KENALOG) 0.1 % Apply 1 application topically 2 (two) times daily.   Yes [provider]   No Known Allergies Review  of Systems  Unable to perform ROS   Physical Exam  Constitutional:  Frail appearing but in NAD  Cardiovascular: Normal rate and regular rhythm.  Pulmonary/Chest: Effort normal and breath sounds normal.  On O2  Abdominal: Soft. Bowel sounds are normal.  Neurological: She is alert.  Oriented to person  Skin: Skin is warm and dry.  Nursing note and vitals reviewed.   Vital Signs: BP 98/70 (BP Location: Left Arm)   Pulse 97   Temp 97.7 F (36.5 C) (Oral)   Resp 18   Wt 67 kg   SpO2 99%   BMI 28.85 kg/m  Pain Scale: PAINAD       SpO2: SpO2: 99 % O2 Device:SpO2: 99 % O2 Flow Rate: .O2 Flow Rate (L/min): 2 L/min  IO: Intake/output summary:   Intake/Output Summary (Last 24 hours) at 09/04/2017 0747 Last data filed at 09/03/2017 1019 Gross per 24 hour  Intake 300 ml  Output -  Net 300 ml    LBM:   Baseline Weight: Weight: 67 kg Most recent weight: Weight: 67 kg     Palliative Assessment/Data:   Flowsheet Rows     Most Recent Value  Intake Tab  Referral Department  Hospitalist  Unit at Time of Referral  Oncology Unit  Date Notified  09/03/17  Reason for referral  Clarify Goals of Care  Date of Admission  09/03/17  Date first seen by Palliative Care  09/04/17  # of days Palliative referral response time  1 Day(s)  # of days IP prior to Palliative referral  0  Clinical Assessment  Palliative Performance Scale Score  40%  Psychosocial & Spiritual Assessment  Palliative Care Outcomes      Time In: 0730 Time Out: 0800 Time Total: 30 minutes Greater than 50%  of this time was spent counseling and coordinating care related to  the above assessment and plan.  Signed by: Irean Hong, NP   Please contact Palliative Medicine Team phone at [phone] for questions and concerns.  For individual provider: See Shea Evans

## 2017-09-04 NOTE — Progress Notes (Signed)
I met with patient's granddaughter, Mercedes Rodriguez, who was at the bedside. She says that prior to this hospitalization, patient was living at home with her son, [name]. Patient was essentially bedbound, only able to ambulate short distances with assistance. Oral intake had been declining. She was receiving home health but not improving with their care.  Granddaughter is aware of the probable diagnosis of metastatic cancer. She agrees that patient would not benefit from future workup or treatment. I discussed the option of hospice at home with granddaughter. She thought this would be a good idea. However, we talked about patient likely needing 24/7 supervision at home. Although son lives in the home, he still works and is not always home in the evenings. Family has already hired a caregiver that could sit with her during the day. We also talked about the future use of the Hospice Home in the event of decline after discharge from the hospital.   Note that patient does not have a HCPOA. Tan says that she and her sister are primarily involved and often make decisions on patient's behalf. However, son would be the legal decision maker. Mercedes Rodriguez thinks that son will want to take patient home with hospice. I have tried calling him several times this morning without success.

## 2017-09-04 NOTE — Progress Notes (Signed)
Chaplain received PG for AD. Chaplain went to patient room and introduced herself and visitor was there waiting on [name] (granddaughter) of patient. [name] arrived [facility] introduced herself and [name] proceed to prepare to completing AD. Chaplain explained she would be back after completion and rounding witnesses's. [place] felt as if patient was not competent. Chaplain returned to room and access patient and agreed that patient was not understanding the AD with clarity. Chaplain explain to [place] the AD could not be completed at this time. [name] understood.

## 2017-09-04 NOTE — Progress Notes (Signed)
EMS here to transport pt, granddaughter notified.

## 2017-09-08 LAB — CULTURE, BLOOD (ROUTINE X 2): Culture: NO GROWTH

## 2017-09-09 ENCOUNTER — Telehealth: Payer: Self-pay

## 2017-09-09 NOTE — Telephone Encounter (Signed)
EMMI Follow-up: Noted on the report that the caregiver responded that there was no follow-up appointment.  I talked with Ms. Moshier's son, [name] with whom she lives with.  He said she is on comfort measures due Stage 4 CA and hospice is following in the home. It was noted on the AVS that there was an appointment scheduled for Friday, 9/6 and he said she would not be able to make it, just taking it one day at a time.  He will call MD office to cancel the appointment in order not to incur a no show fee.  No needs noted.

## 2017-10-06 DEATH — deceased

## 2018-07-15 IMAGING — DX DG CHEST 1V PORT
1 series · 1 of 1 positions shown · non-contrast
Comparison: 01/03/2017 chest radiograph.

CLINICAL DATA: Chest pain

EXAM:
PORTABLE CHEST 1 VIEW

[chest ap]
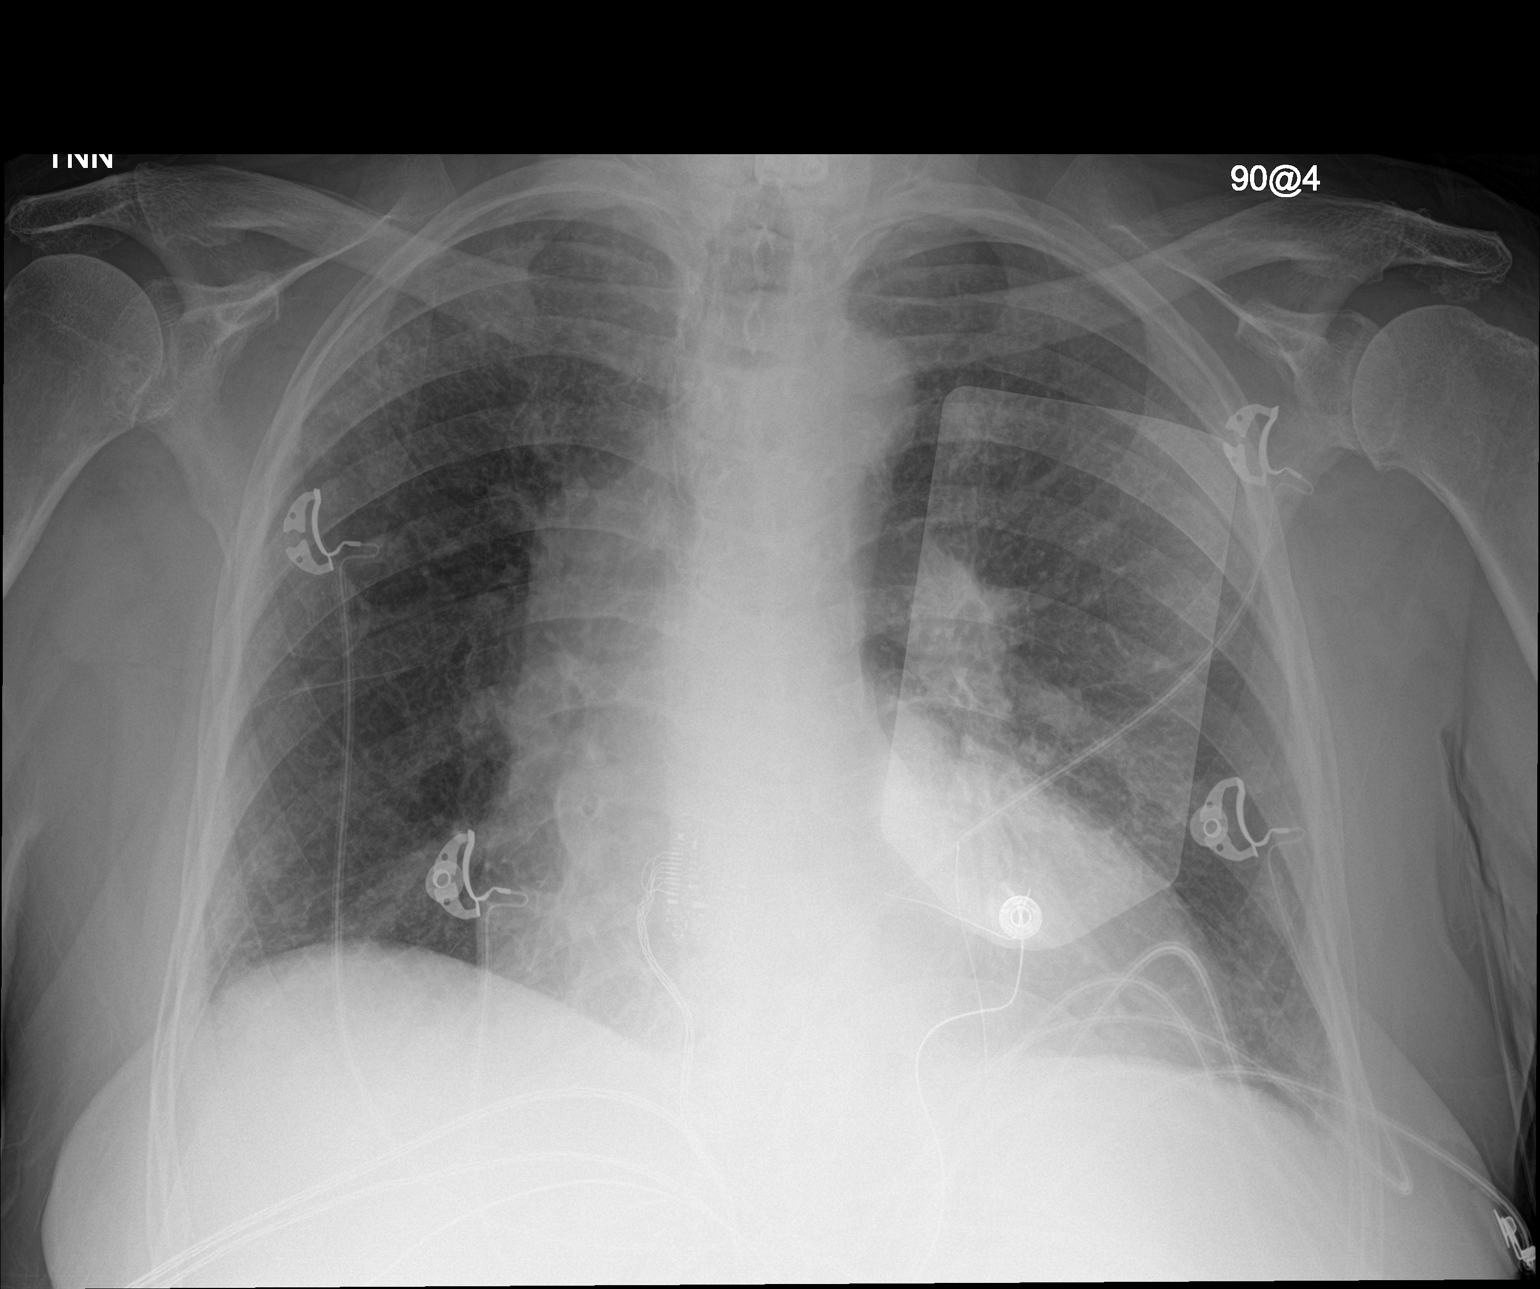

[1 of 1 positions shown; findings below may reference images not displayed]

FINDINGS: Pacer pad overlies the left chest. Partially visualized surgical
hardware overlying the lower cervical spine. Stable
cardiomediastinal silhouette with mild cardiomegaly. No
pneumothorax. Trace dependent bilateral pleural effusions, stable.
Mild pulmonary edema, decreased.
IMPRESSION: Mild congestive heart failure, improved. Trace dependent bilateral
pleural effusions, stable.

## 2019-01-26 IMAGING — DX DG CHEST 1V PORT
1 series · 1 of 1 positions shown · non-contrast
Comparison: 08/13/2017 chest radiograph

CLINICAL DATA: 87 y/o F; sudden onset nausea and vomiting as well
as upper epigastric and chest pain.

EXAM:
PORTABLE CHEST 1 VIEW

[chest ap]
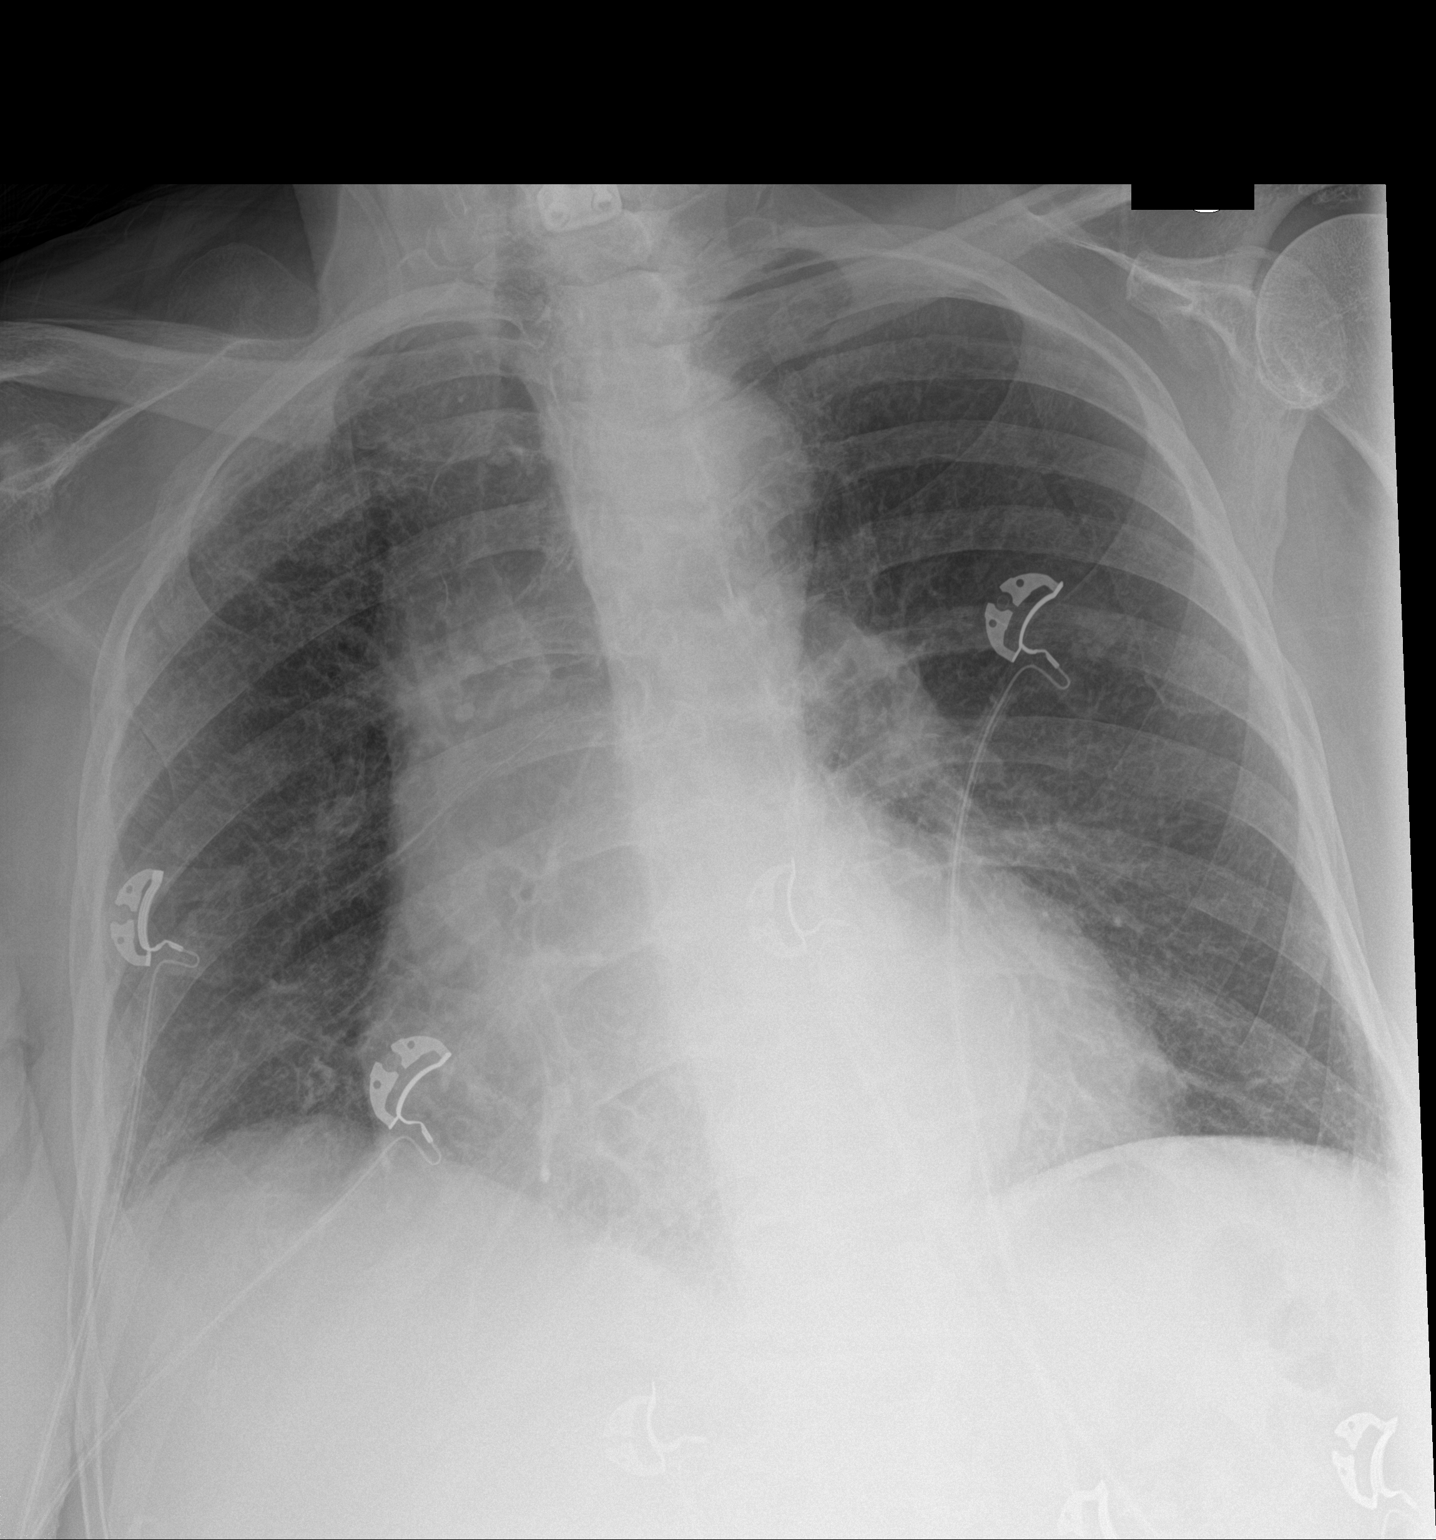

[1 of 1 positions shown; findings below may reference images not displayed]

FINDINGS: Stable cardiomegaly given projection and technique. Aortic
atherosclerosis with calcification. Clear lungs. No pleural effusion
or pneumothorax. No acute osseous abnormality is evident. Anterior
cervical discectomy and fusion hardware noted.
IMPRESSION: No active disease.

By: Hyellamada Wellington M.D.

## 2019-01-26 IMAGING — CT CT ABD-PELV W/O CM
2 of 4 series · 15 of 46 positions shown, 17 images · non-contrast
Comparison: Right thigh CT 07/22/2017 and MRI 07/24/2017. No prior
imaging of the abdomen and pelvis.

CLINICAL DATA: Acute epigastric/chest pain with nausea and
vomiting. History of gastric cancer and right thigh mass.

EXAM:
CT ABDOMEN AND PELVIS WITHOUT CONTRAST
TECHNIQUE: Multidetector CT imaging of the abdomen and pelvis was performed
following the standard protocol without IV contrast.

[Series 2: routine abd/pel wo · axial · 0.83mm/px · z∈[-993,-553]mm · 12 of 98 slices shown, 14 images]
[im 5/98  soft-tissue]
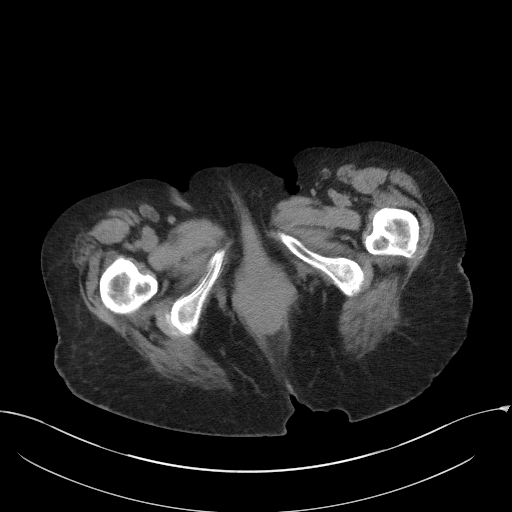
[im 5/98  bone]
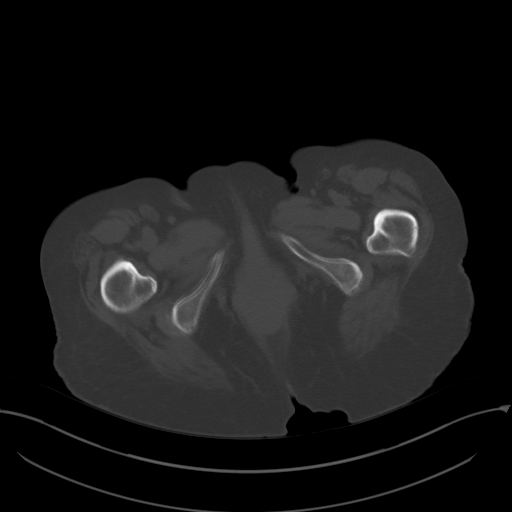
[im 13/98  soft-tissue]
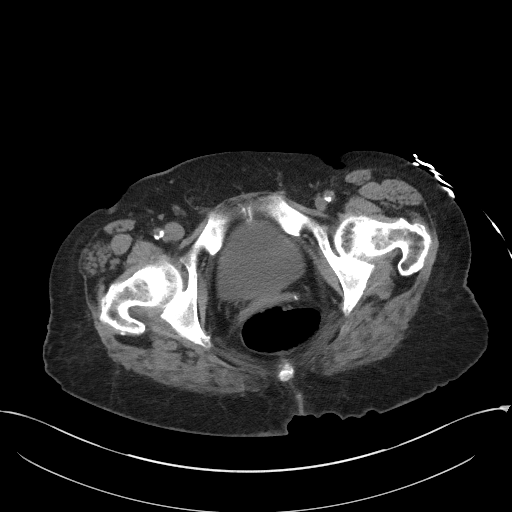
[im 21/98  soft-tissue]
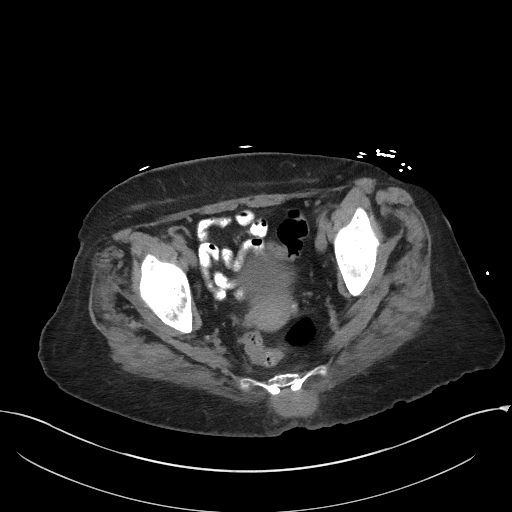
[im 29/98  soft-tissue]
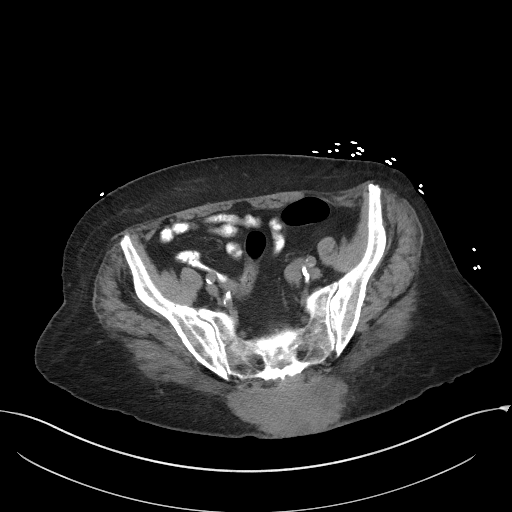
[im 37/98  soft-tissue]
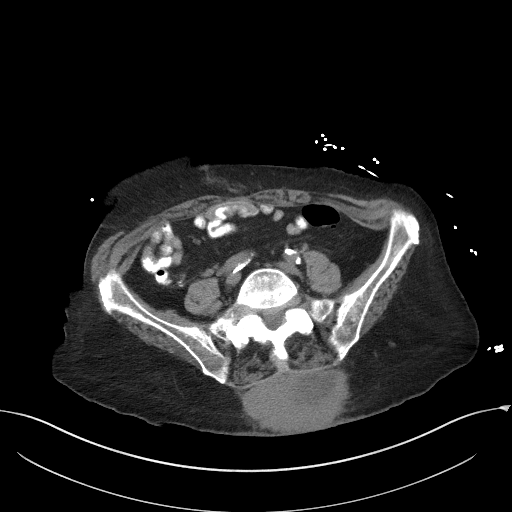
[im 45/98  soft-tissue]
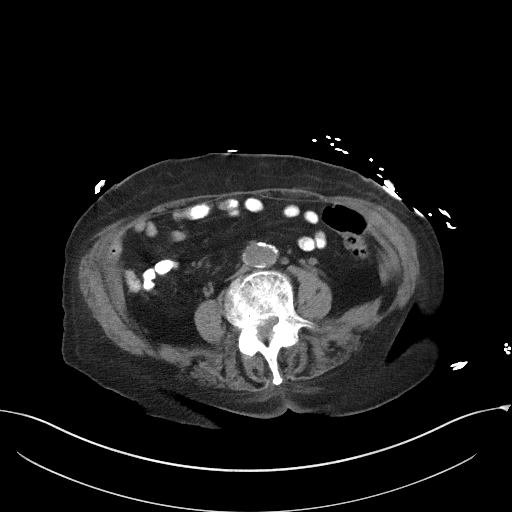
[im 53/98  soft-tissue]
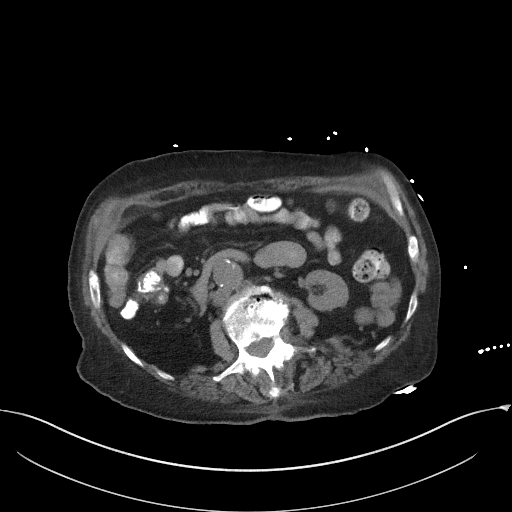
[im 61/98  soft-tissue]
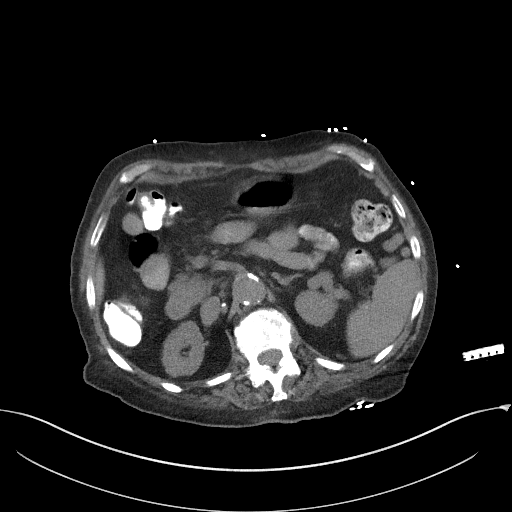
[im 69/98  soft-tissue]
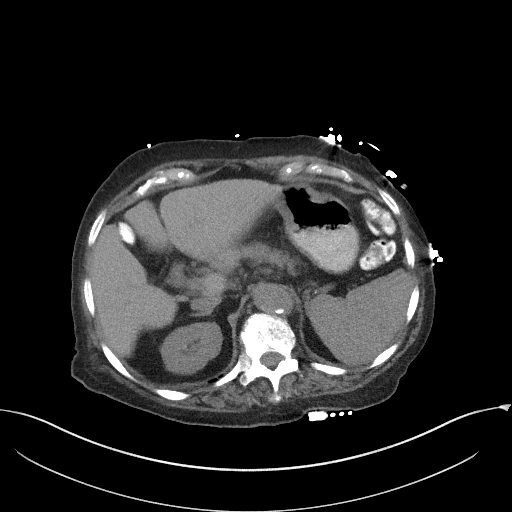
[im 69/98  bone]
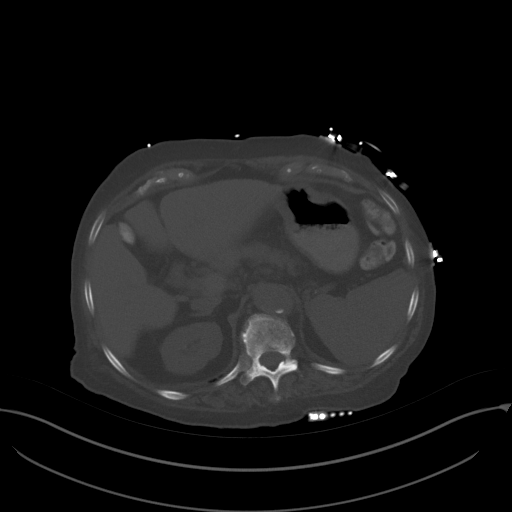
[im 77/98  soft-tissue]
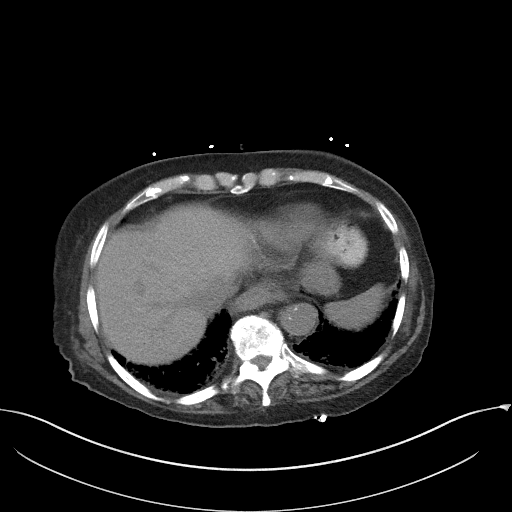
[im 85/98  soft-tissue]
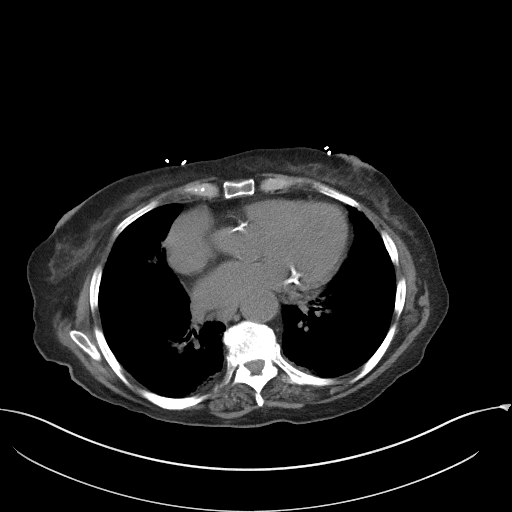
[im 93/98  soft-tissue]
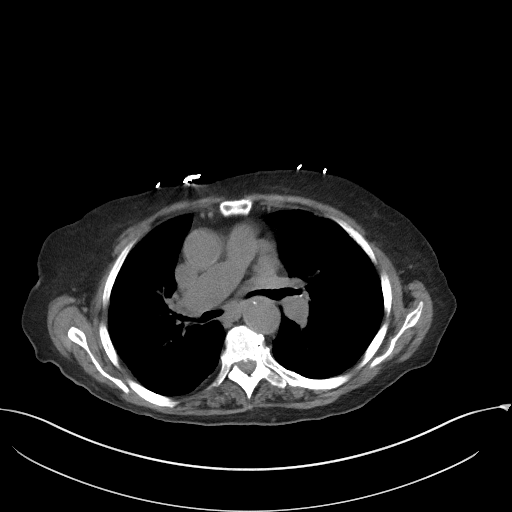

[Series 5: coronal st · coronal · 0.78mm/px · 3 of 89 slices shown]
[im 30/89  soft-tissue]
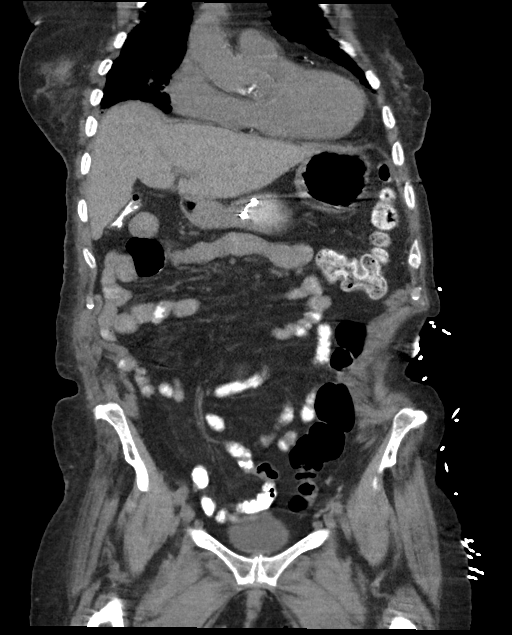
[im 40/89  soft-tissue]
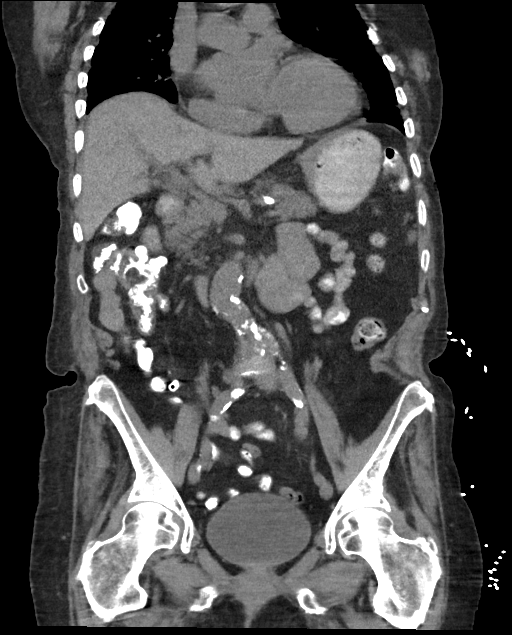
[im 49/89  soft-tissue]
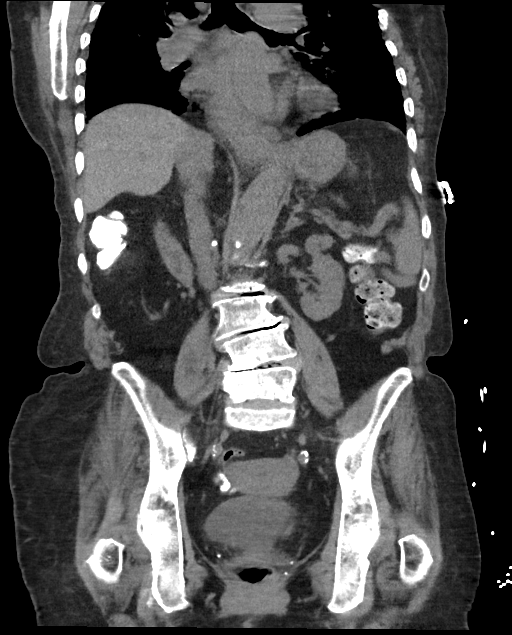

[15 of 46 positions shown; findings below may reference images not displayed]

FINDINGS: Lower chest: Motion artifact through the lung bases with mild
scarring/fibrotic changes. No pleural effusion. Coronary artery
atherosclerosis.

Hepatobiliary: Punctate calcified granuloma in the liver. Mild
nodularity of the liver contour with prominence of the lateral
segment of the left hepatic lobe. No significant biliary dilatation
status post cholecystectomy.

Pancreas: Unremarkable.

Spleen: 1.7 cm hypoattenuating lesion posteriorly in the spleen.

Adrenals/Urinary Tract: Unremarkable adrenal glands. Punctate right
upper pole renal calculi. No hydronephrosis. 2.9 cm exophytic
low-density left renal lesion compatible with a cyst. Unremarkable
bladder.

Stomach/Bowel: A clip is present in the region of the distal gastric
body. There is no evidence of bowel obstruction. The appendix is not
clearly identified, however no inflammatory changes are seen about
the cecum.

Vascular/Lymphatic: Abdominal aortic atherosclerosis without
aneurysm. Multiple splenic varices with a splenorenal shunt. No
enlarged lymph nodes.

Reproductive: Unremarkable uterus and ovaries.

Other: No intraperitoneal free fluid. Small fat containing
paraumbilical hernia.

Musculoskeletal: There is advanced diffuse thoracic and lumbar disc
degeneration as well as advanced lumbar facet arthrosis. No
suspicious osseous lesion is identified. There is however an 11 x 5
x 11 cm soft tissue mass posterior to the sacrum which extends
superficially to the dermis with deep extension to the posterior
margin of the sacrum and coccyx without evidence of associated
osseous destruction. This has a similar CT appearance to the right
thigh mass, with regions of hypoattenuation which may reflect
necrosis. A smaller subcutaneous soft tissue nodule slightly more
superiorly and laterally on the right in the low back measures
cm.
IMPRESSION: 1. 11 cm mass posterior to the sacrum, similar in appearance to the
right thigh mass and concerning for metastatic disease.
2. Separate 2 cm subcutaneous soft tissue nodule in the right low
back.
3. 1.7 cm splenic lesion, indeterminate though metastatic disease is
a possibility given the above findings.
4. Cirrhosis and splenic varices.
5. No acute abnormality identified in the abdomen or pelvis.
6. Aortic Atherosclerosis ([id number]-C32.2).

## 2019-12-31 IMAGING — US US CAROTID DUPLEX BILAT
2 series · 13 of 24 positions shown · non-contrast
Comparison: None.

CLINICAL DATA: TIA. History of hypertension. Former smoker. History
of atrial fibrillation.

EXAM:
BILATERAL CAROTID DUPLEX ULTRASOUND
TECHNIQUE: Gray scale imaging, color Doppler and duplex ultrasound were
performed of bilateral carotid and vertebral arteries in the neck.

[Series 1: us carotid duplex bilat · 0.06mm/px · 12 of 66 slices shown (1 of 2)]
[im 1/66]
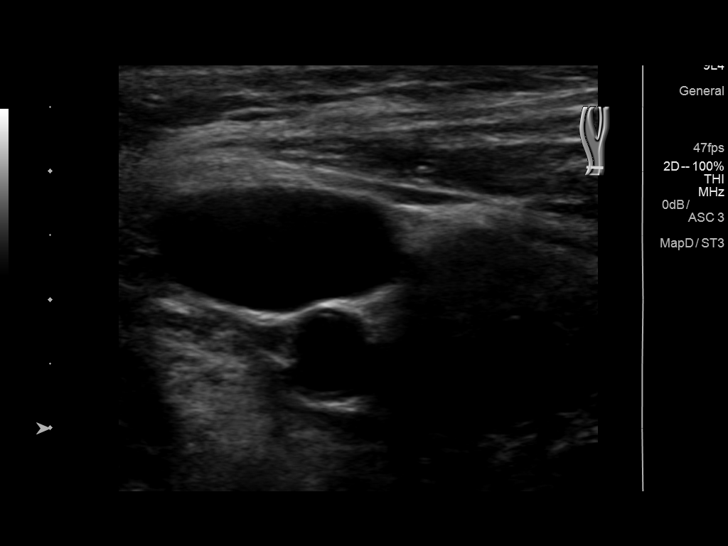
[im 6/66]
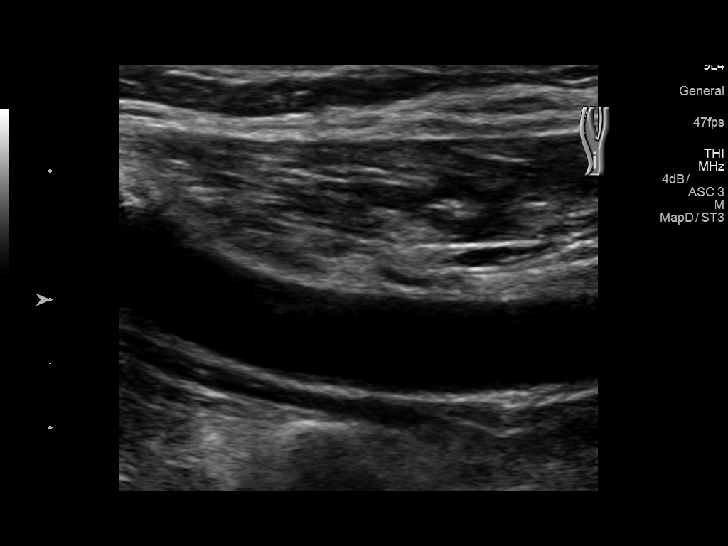
[im 12/66]
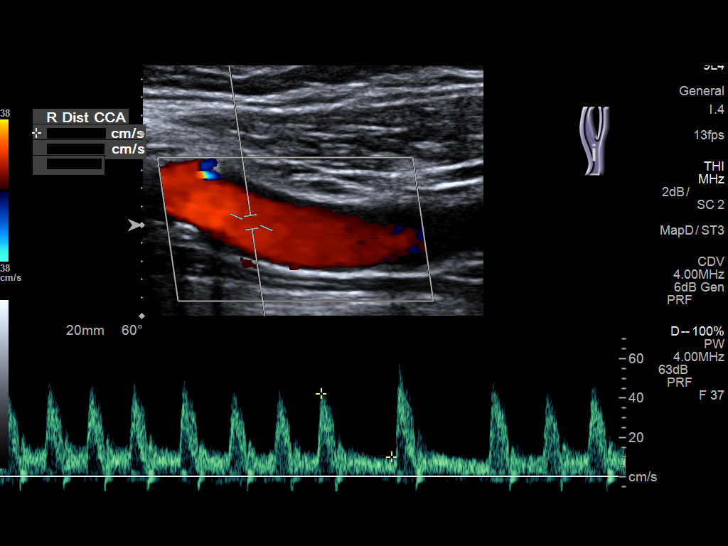
[im 18/66]
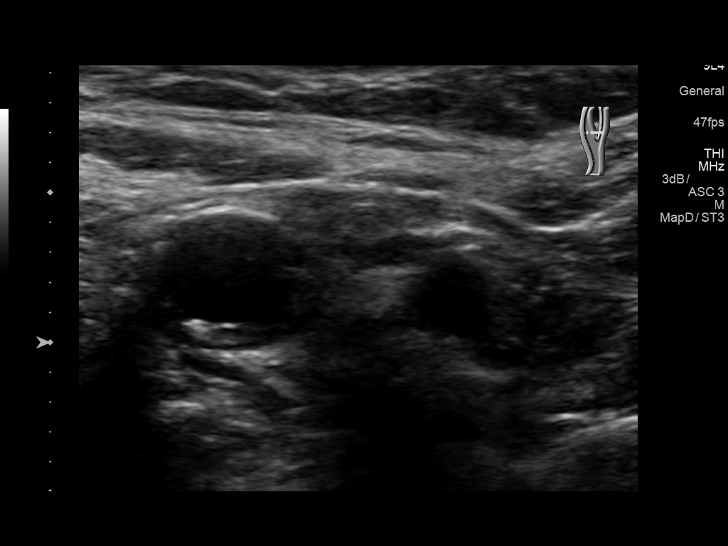
[im 24/66]
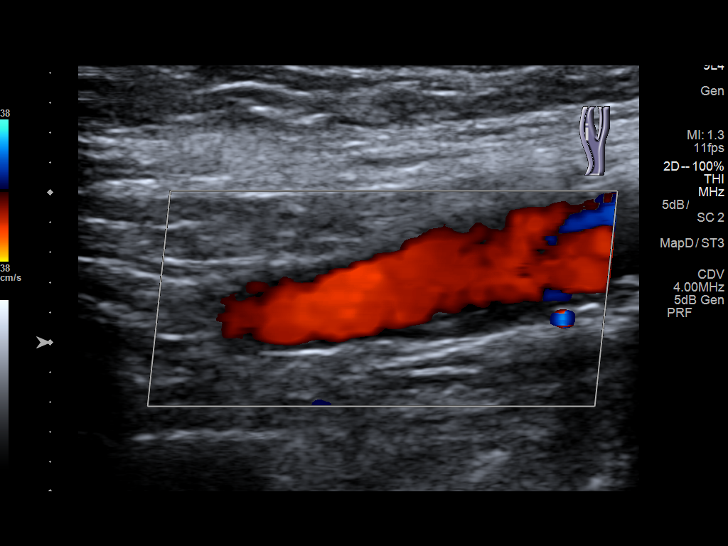
[im 30/66]
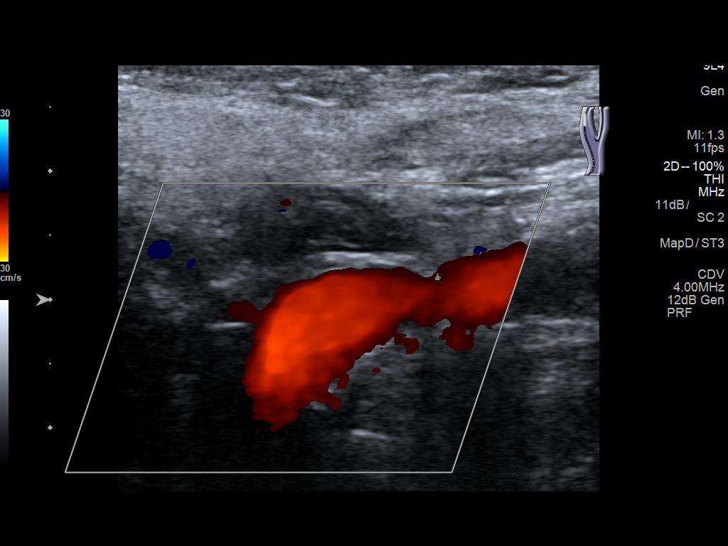
[im 36/66]
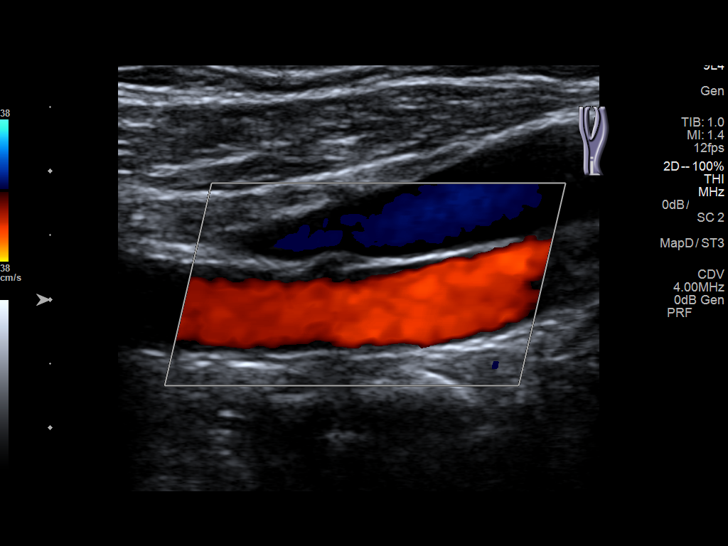
[im 39/66]
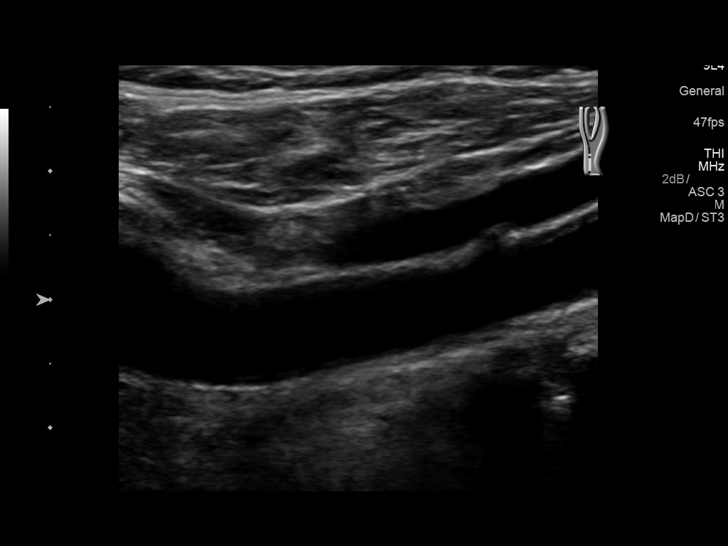
[im 45/66]
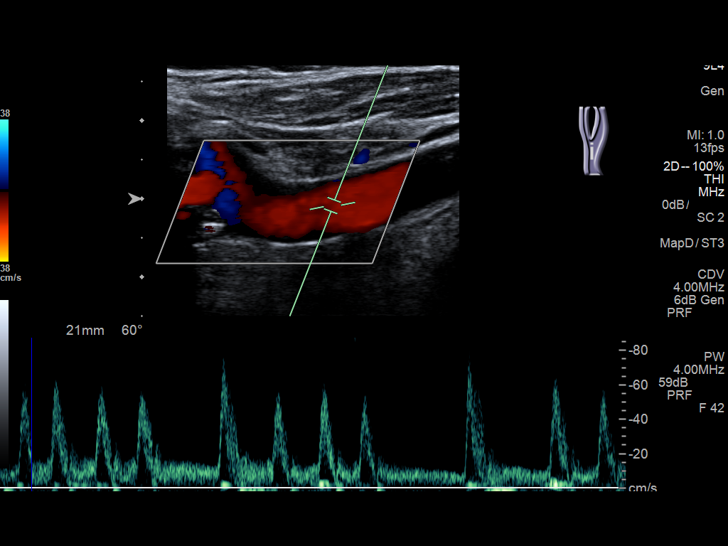
[im 51/66]
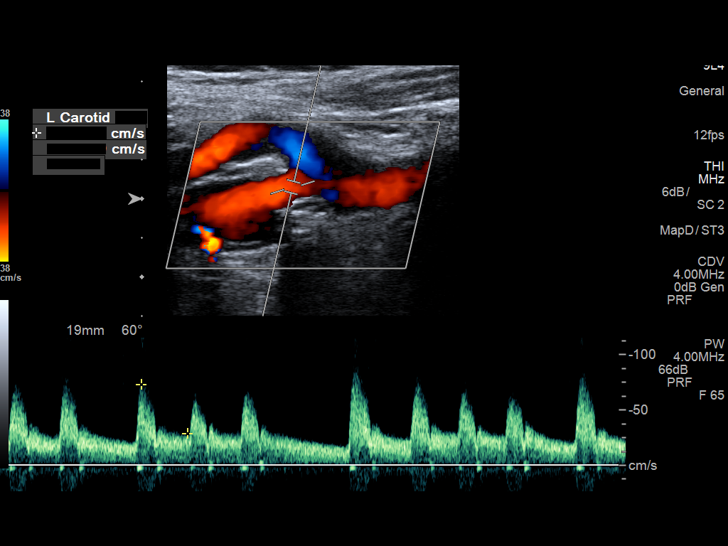
[im 57/66]
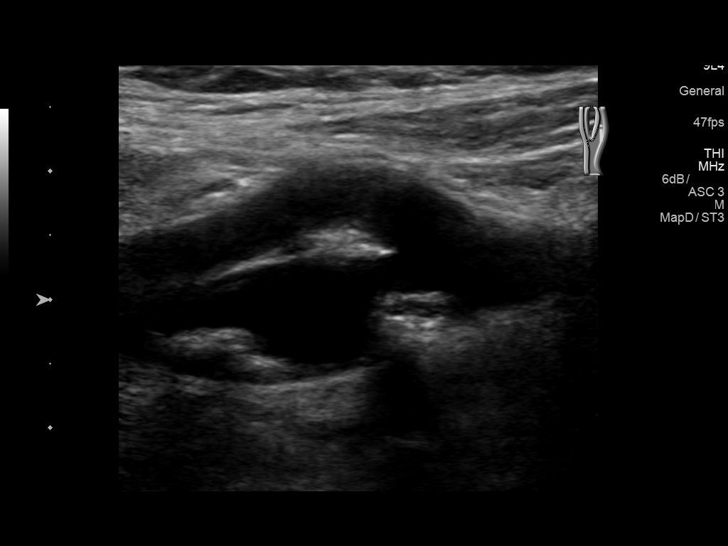
[im 63/66]
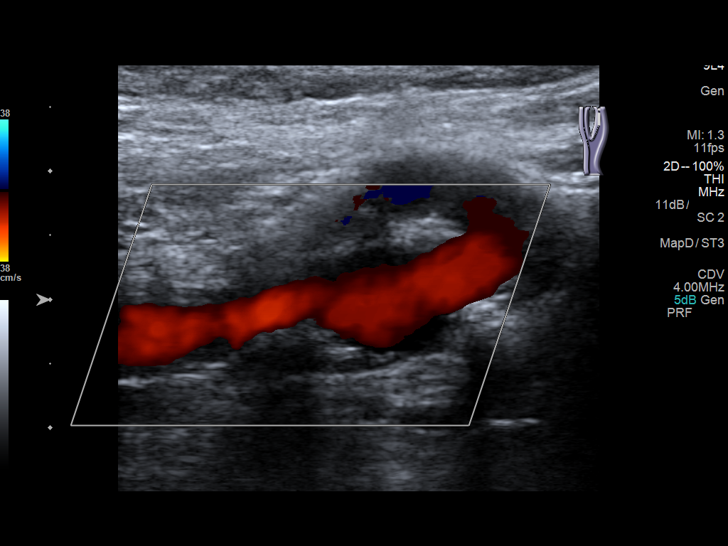

[Series 2001: us carotid duplex bilat · 1 of 1 slices shown (2 of 2)]
[im 1/1]
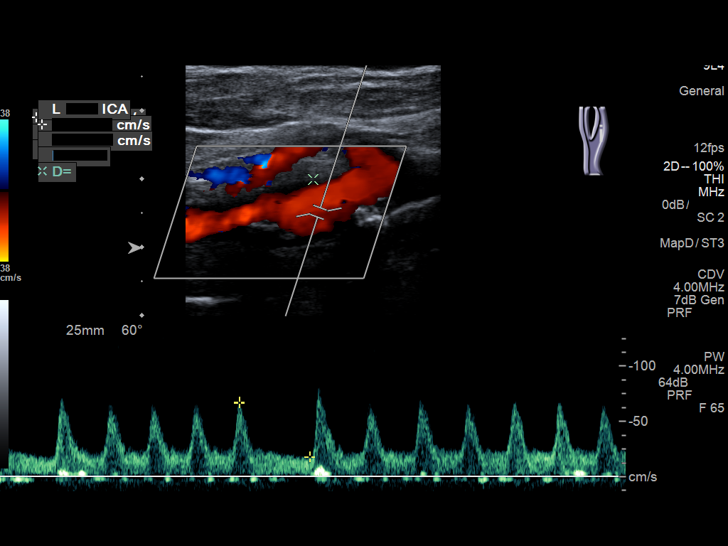

[13 of 24 positions shown; findings below may reference images not displayed]

FINDINGS: Criteria: Quantification of carotid stenosis is based on velocity
parameters that correlate the residual internal carotid diameter
with NASCET-based stenosis levels, using the diameter of the distal
internal carotid lumen as the denominator for stenosis measurement.

The following velocity measurements were obtained:

RIGHT

ICA:  63/22 cm/sec

CCA:  249/12 cm/sec

SYSTOLIC ICA/CCA RATIO:

ECA:  72 cm/sec

LEFT

ICA:  119/37 cm/sec

CCA:  61/18 cm/sec

SYSTOLIC ICA/CCA RATIO:

ECA:  99 cm/sec

RIGHT CAROTID ARTERY: There is a minimal to moderate amount of mixed
echogenic plaque within the right carotid bulb (image 16), extending
to involve the origin and proximal aspects of the right internal
carotid artery (image 24), not resulting in elevated peak systolic
velocities within the interrogated course of the right internal
carotid artery to suggest a hemodynamically significant stenosis.

RIGHT VERTEBRAL ARTERY:  Antegrade flow

LEFT CAROTID ARTERY: There is a moderate to large amount of
echogenic plaque within the left carotid bulb (image 50) extending
to involve the origin and proximal aspects of the left internal
carotid artery (image 58) morphologically resulting in 50% luminal
narrowing with borderline elevated peak systolic velocities within
the left internal carotid artery. Greatest acquired peak systolic
velocity within the left internal carotid artery measures 119
centimeters/second (image 63).

LEFT VERTEBRAL ARTERY:  Antegrade Flow
IMPRESSION: 1. Large amount of left-sided atherosclerotic plaque morphologically
results in 50% luminal narrowing and is associated with borderline
elevated peak systolic velocities which approach the 50-69% luminal
narrowing range. Further evaluation with CTA could be performed as
clinically indicated.
2. Minimal to moderate amount of right-sided atherosclerotic plaque,
not resulting in a hemodynamically significant stenosis.
